# Patient Record
Sex: Female | Born: 1985
Health system: Southern US, Community
[De-identification: ages and names within clinical notes are randomized; demographics above are authoritative.]

## PROBLEM LIST (undated history)

## (undated) DIAGNOSIS — K429 Umbilical hernia without obstruction or gangrene: Secondary | ICD-10-CM

## (undated) DIAGNOSIS — J45909 Unspecified asthma, uncomplicated: Secondary | ICD-10-CM

## (undated) DIAGNOSIS — G43909 Migraine, unspecified, not intractable, without status migrainosus: Secondary | ICD-10-CM

## (undated) DIAGNOSIS — O24419 Gestational diabetes mellitus in pregnancy, unspecified control: Secondary | ICD-10-CM

## (undated) DIAGNOSIS — F419 Anxiety disorder, unspecified: Secondary | ICD-10-CM

## (undated) DIAGNOSIS — E785 Hyperlipidemia, unspecified: Secondary | ICD-10-CM

## (undated) DIAGNOSIS — D649 Anemia, unspecified: Secondary | ICD-10-CM

## (undated) HISTORY — DX: Hyperlipidemia, unspecified: E78.5

## (undated) HISTORY — DX: Migraine, unspecified, not intractable, without status migrainosus: G43.909

## (undated) HISTORY — DX: Anxiety disorder, unspecified: F41.9

## (undated) HISTORY — PX: WISDOM TOOTH EXTRACTION: SHX21

## (undated) HISTORY — DX: Anemia, unspecified: D64.9

## (undated) HISTORY — DX: Gestational diabetes mellitus in pregnancy, unspecified control: O24.419

## (undated) HISTORY — PX: TUBAL LIGATION: SHX77

## (undated) HISTORY — DX: Unspecified asthma, uncomplicated: J45.909

## (undated) HISTORY — PX: HERNIA REPAIR: SHX51

---

## 2005-05-11 ENCOUNTER — Ambulatory Visit: Payer: Self-pay | Admitting: Internal Medicine

## 2005-05-25 ENCOUNTER — Ambulatory Visit: Payer: Self-pay | Admitting: Gastroenterology

## 2009-12-30 ENCOUNTER — Ambulatory Visit: Payer: Self-pay | Admitting: Internal Medicine

## 2010-01-05 ENCOUNTER — Emergency Department: Payer: Self-pay | Admitting: Emergency Medicine

## 2010-09-13 ENCOUNTER — Encounter: Payer: Self-pay | Admitting: Maternal and Fetal Medicine

## 2010-10-11 ENCOUNTER — Encounter: Payer: Self-pay | Admitting: Maternal & Fetal Medicine

## 2010-11-08 ENCOUNTER — Encounter: Payer: Self-pay | Admitting: Obstetrics & Gynecology

## 2010-12-09 ENCOUNTER — Encounter: Payer: Self-pay | Admitting: Obstetrics and Gynecology

## 2011-01-11 ENCOUNTER — Inpatient Hospital Stay: Payer: Self-pay

## 2011-08-23 ENCOUNTER — Ambulatory Visit: Payer: Self-pay

## 2011-09-01 LAB — OB RESULTS CONSOLE HIV ANTIBODY (ROUTINE TESTING): HIV: NONREACTIVE

## 2011-09-01 LAB — OB RESULTS CONSOLE RPR: RPR: NONREACTIVE

## 2011-09-01 LAB — OB RESULTS CONSOLE ABO/RH: RH Type: POSITIVE

## 2012-02-24 ENCOUNTER — Ambulatory Visit (INDEPENDENT_AMBULATORY_CARE_PROVIDER_SITE_OTHER): Payer: 59 | Admitting: Obstetrics and Gynecology

## 2012-02-24 ENCOUNTER — Encounter: Payer: Self-pay | Admitting: Obstetrics and Gynecology

## 2012-02-24 VITALS — BP 103/70 | Ht 63.0 in | Wt 195.0 lb

## 2012-02-24 DIAGNOSIS — Z3480 Encounter for supervision of other normal pregnancy, unspecified trimester: Secondary | ICD-10-CM

## 2012-02-24 DIAGNOSIS — G43909 Migraine, unspecified, not intractable, without status migrainosus: Secondary | ICD-10-CM | POA: Insufficient documentation

## 2012-02-24 DIAGNOSIS — Z3490 Encounter for supervision of normal pregnancy, unspecified, unspecified trimester: Secondary | ICD-10-CM | POA: Insufficient documentation

## 2012-02-24 DIAGNOSIS — O99519 Diseases of the respiratory system complicating pregnancy, unspecified trimester: Secondary | ICD-10-CM

## 2012-02-24 DIAGNOSIS — J45909 Unspecified asthma, uncomplicated: Secondary | ICD-10-CM | POA: Insufficient documentation

## 2012-02-24 DIAGNOSIS — O34219 Maternal care for unspecified type scar from previous cesarean delivery: Secondary | ICD-10-CM | POA: Insufficient documentation

## 2012-02-24 DIAGNOSIS — O99891 Other specified diseases and conditions complicating pregnancy: Secondary | ICD-10-CM

## 2012-02-24 NOTE — Progress Notes (Signed)
Patient transferred care from [facility]- no records available for review at this time. Patient is without any complaints. Reports uncomplicated pregnancy thus far. Patient desires TOLAC- reviewed risks and benefits with patient. Will have patient sign consent after review of records. She reports previous c-section secondary to failure to progress

## 2012-03-08 ENCOUNTER — Ambulatory Visit (INDEPENDENT_AMBULATORY_CARE_PROVIDER_SITE_OTHER): Payer: 59 | Admitting: Obstetrics & Gynecology

## 2012-03-08 VITALS — BP 114/73 | Wt 197.0 lb

## 2012-03-08 DIAGNOSIS — Z3490 Encounter for supervision of normal pregnancy, unspecified, unspecified trimester: Secondary | ICD-10-CM

## 2012-03-08 DIAGNOSIS — Z3480 Encounter for supervision of other normal pregnancy, unspecified trimester: Secondary | ICD-10-CM

## 2012-03-08 NOTE — Progress Notes (Signed)
Reviewed records from [place], no complications. History of previous cesarean section, desires TOLAC, will review consent at home and sign at next visit. Does not want BTL, unsure of BCM. No other complaints or concerns.  Fetal movement and labor precautions reviewed.

## 2012-03-08 NOTE — Patient Instructions (Signed)
Return to clinic for any obstetric concerns or go to [facility] for evaluation  

## 2012-03-21 HISTORY — PX: BREAST CYST EXCISION: SHX579

## 2012-03-22 ENCOUNTER — Encounter: Payer: Self-pay | Admitting: Obstetrics & Gynecology

## 2012-03-22 ENCOUNTER — Ambulatory Visit (INDEPENDENT_AMBULATORY_CARE_PROVIDER_SITE_OTHER): Payer: 59 | Admitting: Obstetrics & Gynecology

## 2012-03-22 VITALS — BP 126/67 | Wt 198.0 lb

## 2012-03-22 DIAGNOSIS — Z3480 Encounter for supervision of other normal pregnancy, unspecified trimester: Secondary | ICD-10-CM

## 2012-03-22 DIAGNOSIS — Z3490 Encounter for supervision of normal pregnancy, unspecified, unspecified trimester: Secondary | ICD-10-CM

## 2012-03-22 DIAGNOSIS — Z8279 Family history of other congenital malformations, deformations and chromosomal abnormalities: Secondary | ICD-10-CM | POA: Insufficient documentation

## 2012-03-22 DIAGNOSIS — O34219 Maternal care for unspecified type scar from previous cesarean delivery: Secondary | ICD-10-CM

## 2012-03-22 NOTE — Progress Notes (Signed)
Rare contractions, no discharge, good movement

## 2012-03-22 NOTE — Patient Instructions (Addendum)
Vaginal Birth After Cesarean Delivery  Vaginal birth after Cesarean delivery (VBAC) is giving birth vaginally after previously delivering a baby by a cesarean. In the past, if a woman had a Cesarean delivery, all births afterwards would be done by Cesarean delivery. This is no longer true. It can be safe for the mother to try a vaginal delivery after having a Cesarean. The final decision to have a VBAC or repeat Cesarean delivery should be between the patient and her caregiver. The risks and benefits can be discussed relative to the reason for, and the type of the previous Cesarean delivery.  WOMEN WHO PLAN TO HAVE A VBAC SHOULD CHECK WITH THEIR DOCTOR TO BE SURE THAT:  · The previous Cesarean was done with a low transverse uterine incision (not a vertical classical incision).  · The birth canal is big enough for the baby.  · There were no other operations on the uterus.  · They will have an electronic fetal monitor (EFM) on at all times during labor.  · An operating room would be available and ready in case an emergency Cesarean is needed.  · A doctor and surgical nursing staff would be available at all times during labor to be ready to do an emergency Cesarean if necessary.  · An anesthesiologist would be present in case an emergency Cesarean is needed.  · The nursery is prepared and has adequate personnel and necessary equipment available to care for the baby in case of an emergency Cesarean.  BENEFITS OF VBAC:  · Shorter stay in the hospital.  · Lower delivery, nursery and hospital costs.  · Less blood loss and need for blood transfusions.  · Less fever and discomfort from major surgery.  · Lower risk of blood clots.  · Lower risk of infection.  · Shorter recovery after going home.  · Lower risk of other surgical complications, such as opening of the incision or hernia in the incision.  · Decreased risk of injury to other organs.  · Decreased risk for having to remove the uterus (hysterectomy).  · Decreased risk  for the placenta to completely or partially cover the opening of the uterus (placenta previa) with a future pregnancy.  · Ability to have a larger family if desired.  RISKS OF A VBAC:  · Rupture of the uterus.  · Having to remove the uterus (hysterectomy) if it ruptures.  · All the complications of major surgery and/or injury to other organs.  · Excessive bleeding, blood clots and infection.  · Lower Apgar scores (method to evaluate the newborn based on appearance, pulse, grimace, activity, and respiration) and more risks to the baby.  · There is a higher risk of uterine rupture if you induce or augment labor.  · There is a higher risk of uterine rupture if you use medications to ripen the cervix.  VBAC SHOULD NOT BE DONE IF:  · The previous Cesarean was done with a vertical (classical) or T-shaped incision, or you do not know what kind of an incision was made.  · You had a ruptured uterus.  · You had surgery on your uterus.  · You have medical or obstetrical problems.  · There are problems with the baby.  · There were two previous Cesarean deliveries and no vaginal deliveries.  OTHER FACTS TO KNOW ABOUT VBAC:  · It is safe to have an epidural anesthetic with VBAC.  · It is safe to turn the baby from a breech position (attempt an external   cephalic version).  · It is safe to try a VBAC with twins.  · Pregnancies later than 40 weeks have not been successful with VBAC.  · There is an increased failure rate of a VBAC in obese pregnant women.  · There is an increased failure rate with VABC if the baby weighs 8.8 pounds (4000 grams) or more.  · There is an increased failure rate if the time between the Cesarean and VBAC is less than 19 months.  · There is an increased failure rate if pre-eclampsia is present (high blood pressure, protein in the urine and swelling of face and extremities).  · VBAC is very successful if there was a previous vaginal birth.  · VBAC is very successful when the labor starts spontaneously before  the due date.  · Delivery of VBAC is similar to having a normal spontaneous vaginal delivery.  It is important to discuss VBAC with your caregiver early in the pregnancy so you can understand the risks, benefits and options. It will give you time to decide what is best in your particular case relevant to the reason for your previous Cesarean delivery. It should be understood that medical changes in the mother or pregnancy may occur during the pregnancy, which make it necessary to change you or your caregiver's initial decision. The counseling, concerns and decisions should be documented in the medical record and signed by all parties.  Document Released: 08/28/2006 Document Revised: 05/30/2011 Document Reviewed: 04/18/2008  [employer]® Patient Information ©2013 [employer].

## 2012-03-30 ENCOUNTER — Ambulatory Visit (INDEPENDENT_AMBULATORY_CARE_PROVIDER_SITE_OTHER): Payer: 59 | Admitting: Obstetrics & Gynecology

## 2012-03-30 ENCOUNTER — Encounter: Payer: Self-pay | Admitting: Obstetrics & Gynecology

## 2012-03-30 ENCOUNTER — Other Ambulatory Visit: Payer: Self-pay | Admitting: Obstetrics & Gynecology

## 2012-03-30 VITALS — BP 121/74 | Wt 197.0 lb

## 2012-03-30 DIAGNOSIS — Z3480 Encounter for supervision of other normal pregnancy, unspecified trimester: Secondary | ICD-10-CM

## 2012-03-30 DIAGNOSIS — Z3490 Encounter for supervision of normal pregnancy, unspecified, unspecified trimester: Secondary | ICD-10-CM

## 2012-03-30 DIAGNOSIS — Z3400 Encounter for supervision of normal first pregnancy, unspecified trimester: Secondary | ICD-10-CM

## 2012-03-30 DIAGNOSIS — O34219 Maternal care for unspecified type scar from previous cesarean delivery: Secondary | ICD-10-CM

## 2012-03-30 NOTE — Progress Notes (Signed)
Routine visit. Good FM. No OB problems. Labor precautions reviewed. She says that she signed the [facility] consent form. Cervical cultures obtained.

## 2012-04-04 ENCOUNTER — Encounter: Payer: Self-pay | Admitting: Obstetrics & Gynecology

## 2012-04-05 LAB — TEST CODE CHANGE

## 2012-04-06 ENCOUNTER — Ambulatory Visit (INDEPENDENT_AMBULATORY_CARE_PROVIDER_SITE_OTHER): Payer: 59 | Admitting: Family Medicine

## 2012-04-06 ENCOUNTER — Encounter: Payer: Self-pay | Admitting: Family Medicine

## 2012-04-06 VITALS — BP 125/80 | Wt 199.0 lb

## 2012-04-06 DIAGNOSIS — O34219 Maternal care for unspecified type scar from previous cesarean delivery: Secondary | ICD-10-CM

## 2012-04-06 DIAGNOSIS — Z3480 Encounter for supervision of other normal pregnancy, unspecified trimester: Secondary | ICD-10-CM

## 2012-04-06 NOTE — Progress Notes (Signed)
Has changed her mind and now desires ERCS to avoid day of surgery for previous child. Unless comes in in labor.

## 2012-04-06 NOTE — Patient Instructions (Addendum)
Pregnancy - Third Trimester The third trimester of pregnancy (the last 3 months) is a period of the most rapid growth for you and your baby. The baby approaches a length of 20 inches and a weight of 6 to 10 pounds. The baby is adding on fat and getting ready for life outside your body. While inside, babies have periods of sleeping and waking, suck their thumbs, and hiccups. You can often feel small contractions of the uterus. This is false labor. It is also called Braxton-Hicks contractions. This is like a practice for labor. The usual problems in this stage of pregnancy include more difficulty breathing, swelling of the hands and feet from water retention, and having to urinate more often because of the uterus and baby pressing on your bladder.  PRENATAL EXAMS  Blood work may continue to be done during prenatal exams. These tests are done to check on your health and the probable health of your baby. Blood work is used to follow your blood levels (hemoglobin). Anemia (low hemoglobin) is common during pregnancy. Iron and vitamins are given to help prevent this. You may also continue to be checked for diabetes. Some of the past blood tests may be done again.  The size of the uterus is measured during each visit. This makes sure your baby is growing properly according to your pregnancy dates.  Your blood pressure is checked every prenatal visit. This is to make sure you are not getting toxemia.  Your urine is checked every prenatal visit for infection, diabetes and protein.  Your weight is checked at each visit. This is done to make sure gains are happening at the suggested rate and that you and your baby are growing normally.  Sometimes, an ultrasound is performed to confirm the position and the proper growth and development of the baby. This is a test done that bounces harmless sound waves off the baby so your caregiver can more accurately determine due dates.  Discuss the type of pain medication  and anesthesia you will have during your labor and delivery.  Discuss the possibility and anesthesia if a Cesarean Section might be necessary.  Inform your caregiver if there is any mental or physical violence at home. Sometimes, a specialized non-stress test, contraction stress test and biophysical profile are done to make sure the baby is not having a problem. Checking the amniotic fluid surrounding the baby is called an amniocentesis. The amniotic fluid is removed by sticking a needle into the belly (abdomen). This is sometimes done near the end of pregnancy if an early delivery is required. In this case, it is done to help make sure the baby's lungs are mature enough for the baby to live outside of the womb. If the lungs are not mature and it is unsafe to deliver the baby, an injection of cortisone medication is given to the mother 1 to 2 days before the delivery. This helps the baby's lungs mature and makes it safer to deliver the baby. CHANGES OCCURING IN THE THIRD TRIMESTER OF PREGNANCY Your body goes through many changes during pregnancy. They vary from person to person. Talk to your caregiver about changes you notice and are concerned about.  During the last trimester, you have probably had an increase in your appetite. It is normal to have cravings for certain foods. This varies from person to person and pregnancy to pregnancy.  You may begin to get stretch marks on your hips, abdomen, and breasts. These are normal changes in the body   during pregnancy. There are no exercises or medications to take which prevent this change.  Constipation may be treated with a stool softener or adding bulk to your diet. Drinking lots of fluids, fiber in vegetables, fruits, and whole grains are helpful.  Exercising is also helpful. If you have been very active up until your pregnancy, most of these activities can be continued during your pregnancy. If you have been less active, it is helpful to start an  exercise program such as walking. Consult your caregiver before starting exercise programs.  Avoid all smoking, alcohol, un-prescribed drugs, herbs and "street drugs" during your pregnancy. These chemicals affect the formation and growth of the baby. Avoid chemicals throughout the pregnancy to ensure the delivery of a healthy infant.  Backache, varicose veins and hemorrhoids may develop or get worse.  You will tire more easily in the third trimester, which is normal.  The baby's movements may be stronger and more often.  You may become short of breath easily.  Your belly button may stick out.  A yellow discharge may leak from your breasts called colostrum.  You may have a bloody mucus discharge. This usually occurs a few days to a week before labor begins. HOME CARE INSTRUCTIONS   Keep your caregiver's appointments. Follow your caregiver's instructions regarding medication use, exercise, and diet.  During pregnancy, you are providing food for you and your baby. Continue to eat regular, well-balanced meals. Choose foods such as meat, fish, milk and other low fat dairy products, vegetables, fruits, and whole-grain breads and cereals. Your caregiver will tell you of the ideal weight gain.  A physical sexual relationship may be continued throughout pregnancy if there are no other problems such as early (premature) leaking of amniotic fluid from the membranes, vaginal bleeding, or belly (abdominal) pain.  Exercise regularly if there are no restrictions. Check with your caregiver if you are unsure of the safety of your exercises. Greater weight gain will occur in the last 2 trimesters of pregnancy. Exercising helps:  Control your weight.  Get you in shape for labor and delivery.  You lose weight after you deliver.  Rest a lot with legs elevated, or as needed for leg cramps or low back pain.  Wear a good support or jogging bra for breast tenderness during pregnancy. This may help if worn  during sleep. Pads or tissues may be used in the bra if you are leaking colostrum.  Do not use hot tubs, steam rooms, or saunas.  Wear your seat belt when driving. This protects you and your baby if you are in an accident.  Avoid raw meat, cat litter boxes and soil used by cats. These carry germs that can cause birth defects in the baby.  It is easier to loose urine during pregnancy. Tightening up and strengthening the pelvic muscles will help with this problem. You can practice stopping your urination while you are going to the bathroom. These are the same muscles you need to strengthen. It is also the muscles you would use if you were trying to stop from passing gas. You can practice tightening these muscles up 10 times a set and repeating this about 3 times per day. Once you know what muscles to tighten up, do not perform these exercises during urination. It is more likely to cause an infection by backing up the urine.  Ask for help if you have financial, counseling or nutritional needs during pregnancy. Your caregiver will be able to offer counseling for these   needs as well as refer you for other special needs.  Make a list of emergency phone numbers and have them available.  Plan on getting help from family or friends when you go home from the hospital.  Make a trial run to the hospital.  Take prenatal classes with the father to understand, practice and ask questions about the labor and delivery.  Prepare the baby's room/nursery.  Do not travel out of the city unless it is absolutely necessary and with the advice of your caregiver.  Wear only low or no heal shoes to have better balance and prevent falling. MEDICATIONS AND DRUG USE IN PREGNANCY  Take prenatal vitamins as directed. The vitamin should contain 1 milligram of folic acid. Keep all vitamins out of reach of children. Only a couple vitamins or tablets containing iron may be fatal to a baby or young child when  ingested.  Avoid use of all medications, including herbs, over-the-counter medications, not prescribed or suggested by your caregiver. Only take over-the-counter or prescription medicines for pain, discomfort, or fever as directed by your caregiver. Do not use aspirin, ibuprofen (Motrin, Advil, Nuprin) or naproxen (Aleve) unless OK'd by your caregiver.  Let your caregiver also know about herbs you may be using.  Alcohol is related to a number of birth defects. This includes fetal alcohol syndrome. All alcohol, in any form, should be avoided completely. Smoking will cause low birth rate and premature babies.  Street/illegal drugs are very harmful to the baby. They are absolutely forbidden. A baby born to an addicted mother will be addicted at birth. The baby will go through the same withdrawal an adult does. SEEK MEDICAL CARE IF: You have any concerns or worries during your pregnancy. It is better to call with your questions if you feel they cannot wait, rather than worry about them. DECISIONS ABOUT CIRCUMCISION You may or may not know the sex of your baby. If you know your baby is a boy, it may be time to think about circumcision. Circumcision is the removal of the foreskin of the penis. This is the skin that covers the sensitive end of the penis. There is no proven medical need for this. Often this decision is made on what is popular at the time or based upon religious beliefs and social issues. You can discuss these issues with your caregiver or pediatrician. SEEK IMMEDIATE MEDICAL CARE IF:   An unexplained oral temperature above 102 F (38.9 C) develops, or as your caregiver suggests.  You have leaking of fluid from the vagina (birth canal). If leaking membranes are suspected, take your temperature and tell your caregiver of this when you call.  There is vaginal spotting, bleeding or passing clots. Tell your caregiver of the amount and how many pads are used.  You develop a bad smelling  vaginal discharge with a change in the color from clear to white.  You develop vomiting that lasts more than 24 hours.  You develop chills or fever.  You develop shortness of breath.  You develop burning on urination.  You loose more than 2 pounds of weight or gain more than 2 pounds of weight or as suggested by your caregiver.  You notice sudden swelling of your face, hands, and feet or legs.  You develop belly (abdominal) pain. Round ligament discomfort is a common non-cancerous (benign) cause of abdominal pain in pregnancy. Your caregiver still must evaluate you.  You develop a severe headache that does not go away.  You develop visual   problems, blurred or double vision.  If you have not felt your baby move for more than 1 hour. If you think the baby is not moving as much as usual, eat something with sugar in it and lie down on your left side for an hour. The baby should move at least 4 to 5 times per hour. Call right away if your baby moves less than that.  You fall, are in a car accident or any kind of trauma.  There is mental or physical violence at home. Document Released: 03/01/2001 Document Revised: 05/30/2011 Document Reviewed: 09/03/2008 [employer] Patient Information 2013 [employer].  Breastfeeding Deciding to breastfeed is one of the best choices you can make for you and your baby. The information that follows gives a brief overview of the benefits of breastfeeding as well as common topics surrounding breastfeeding. BENEFITS OF BREASTFEEDING For the baby  The first milk (colostrum) helps the baby's digestive system function better.   There are antibodies in the mother's milk that help the baby fight off infections.   The baby has a lower incidence of asthma, allergies, and sudden infant death syndrome (SIDS).   The nutrients in breast milk are better for the baby than infant formulas, and breast milk helps the baby's brain grow better.   Babies who  breastfeed have less gas, colic, and constipation.  For the mother  Breastfeeding helps develop a very special bond between the mother and her baby.   Breastfeeding is convenient, always available at the correct temperature, and costs nothing.   Breastfeeding burns calories in the mother and helps her lose weight that was gained during pregnancy.   Breastfeeding makes the uterus contract back down to normal size faster and slows bleeding following delivery.   Breastfeeding mothers have a lower risk of developing breast cancer.  BREASTFEEDING FREQUENCY  A healthy, full-term baby may breastfeed as often as every hour or space his or her feedings to every 3 hours.   Watch your baby for signs of hunger. Nurse your baby if he or she shows signs of hunger. How often you nurse will vary from baby to baby.   Nurse as often as the baby requests, or when you feel the need to reduce the fullness of your breasts.   Awaken the baby if it has been 3 4 hours since the last feeding.   Frequent feeding will help the mother make more milk and will help prevent problems, such as sore nipples and engorgement of the breasts.  BABY'S POSITION AT THE BREAST  Whether lying down or sitting, be sure that the baby's tummy is facing your tummy.   Support the breast with 4 fingers underneath the breast and the thumb above. Make sure your fingers are well away from the nipple and baby's mouth.   Stroke the baby's lips gently with your finger or nipple.   When the baby's mouth is open wide enough, place all of your nipple and as much of the areola as possible into your baby's mouth.   Pull the baby in close so the tip of the nose and the baby's cheeks touch the breast during the feeding.  FEEDINGS AND SUCTION  The length of each feeding varies from baby to baby and from feeding to feeding.   The baby must suck about 2 3 minutes for your milk to get to him or her. This is called a "let down."  For this reason, allow the baby to feed on each breast as   long as he or she wants. Your baby will end the feeding when he or she has received the right balance of nutrients.   To break the suction, put your finger into the corner of the baby's mouth and slide it between his or her gums before removing your breast from his or her mouth. This will help prevent sore nipples.  HOW TO TELL WHETHER YOUR BABY IS GETTING ENOUGH BREAST MILK. Wondering whether or not your baby is getting enough milk is a common concern among mothers. You can be assured that your baby is getting enough milk if:   Your baby is actively sucking and you hear swallowing.   Your baby seems relaxed and satisfied after a feeding.   Your baby nurses at least 8 12 times in a 24 hour time period. Nurse your baby until he or she unlatches or falls asleep at the first breast (at least 10 20 minutes), then offer the second side.   Your baby is wetting 5 6 disposable diapers (6 8 cloth diapers) in a 24 hour period by 5 6 days of age.   Your baby is having at least 3 4 stools every 24 hours for the first 6 weeks. The stool should be soft and yellow.   Your baby should gain 4 7 ounces per week after he or she is 4 days old.   Your breasts feel softer after nursing.  REDUCING BREAST ENGORGEMENT  In the first week after your baby is born, you may experience signs of breast engorgement. When breasts are engorged, they feel heavy, warm, full, and may be tender to the touch. You can reduce engorgement if you:   Nurse frequently, every 2 3 hours. Mothers who breastfeed early and often have fewer problems with engorgement.   Place light ice packs on your breasts for 10 20 minutes between feedings. This reduces swelling. Wrap the ice packs in a lightweight towel to protect your skin. Bags of frozen vegetables work well for this purpose.   Take a warm shower or apply warm, moist heat to your breast for 5 10 minutes just before  each feeding. This increases circulation and helps the milk flow.   Gently massage your breast before and during the feeding. Using your finger tips, massage from the chest wall towards your nipple in a circular motion.   Make sure that the baby empties at least one breast at every feeding before switching sides.   Use a breast pump to empty the breasts if your baby is sleepy or not nursing well. You may also want to pump if you are returning to work oryou feel you are getting engorged.   Avoid bottle feeds, pacifiers, or supplemental feedings of water or juice in place of breastfeeding. Breast milk is all the food your baby needs. It is not necessary for your baby to have water or formula. In fact, to help your breasts make more milk, it is best not to give your baby supplemental feedings during the early weeks.   Be sure the baby is latched on and positioned properly while breastfeeding.   Wear a supportive bra, avoiding underwire styles.   Eat a balanced diet with enough fluids.   Rest often, relax, and take your prenatal vitamins to prevent fatigue, stress, and anemia.  If you follow these suggestions, your engorgement should improve in 24 48 hours. If you are still experiencing difficulty, call your lactation consultant or caregiver.  CARING FOR YOURSELF Take care of your   breasts  Bathe or shower daily.   Avoid using soap on your nipples.   Start feedings on your left breast at one feeding and on your right breast at the next feeding.   You will notice an increase in your milk supply 2 5 days after delivery. You may feel some discomfort from engorgement, which makes your breasts very firm and often tender. Engorgement "peaks" out within 24 48 hours. In the meantime, apply warm moist towels to your breasts for 5 10 minutes before feeding. Gentle massage and expression of some milk before feeding will soften your breasts, making it easier for your baby to latch on.    Wear a well-fitting nursing bra, and air dry your nipples for a 3 4minutes after each feeding.   Only use cotton bra pads.   Only use pure lanolin on your nipples after nursing. You do not need to wash it off before feeding the baby again. Another option is to express a few drops of breast milk and gently massage it into your nipples.  Take care of yourself  Eat well-balanced meals and nutritious snacks.   Drinking milk, fruit juice, and water to satisfy your thirst (about 8 glasses a day).   Get plenty of rest.  Avoid foods that you notice affect the baby in a bad way.  SEEK MEDICAL CARE IF:   You have difficulty with breastfeeding and need help.   You have a hard, red, sore area on your breast that is accompanied by a fever.   Your baby is too sleepy to eat well or is having trouble sleeping.   Your baby is wetting less than 6 diapers a day, by 5 days of age.   Your baby's skin or white part of his or her eyes is more yellow than it was in the hospital.   You feel depressed.  Document Released: 03/07/2005 Document Revised: 09/06/2011 Document Reviewed: 06/05/2011 [employer] Patient Information 2013 [employer].  

## 2012-04-09 LAB — GC/CHLAMYDIA PROBE AMP: Chlamydia trachomatis, NAA: NEGATIVE

## 2012-04-10 ENCOUNTER — Encounter: Payer: Self-pay | Admitting: Family Medicine

## 2012-04-10 ENCOUNTER — Ambulatory Visit (INDEPENDENT_AMBULATORY_CARE_PROVIDER_SITE_OTHER): Payer: 59 | Admitting: Family Medicine

## 2012-04-10 VITALS — BP 117/76 | Wt 200.0 lb

## 2012-04-10 DIAGNOSIS — Z3480 Encounter for supervision of other normal pregnancy, unspecified trimester: Secondary | ICD-10-CM

## 2012-04-10 DIAGNOSIS — O34219 Maternal care for unspecified type scar from previous cesarean delivery: Secondary | ICD-10-CM

## 2012-04-10 NOTE — Progress Notes (Signed)
No new issues.  Given date for C-section Membranes stripped.

## 2012-04-10 NOTE — Assessment & Plan Note (Signed)
Scheduled for [facility] with Dr. Penne Lash on 04/16/2012 @ 9 am

## 2012-04-10 NOTE — Patient Instructions (Signed)
Pregnancy - Third Trimester The third trimester of pregnancy (the last 3 months) is a period of the most rapid growth for you and your baby. The baby approaches a length of 20 inches and a weight of 6 to 10 pounds. The baby is adding on fat and getting ready for life outside your body. While inside, babies have periods of sleeping and waking, suck their thumbs, and hiccups. You can often feel small contractions of the uterus. This is false labor. It is also called Braxton-Hicks contractions. This is like a practice for labor. The usual problems in this stage of pregnancy include more difficulty breathing, swelling of the hands and feet from water retention, and having to urinate more often because of the uterus and baby pressing on your bladder.  PRENATAL EXAMS  Blood work may continue to be done during prenatal exams. These tests are done to check on your health and the probable health of your baby. Blood work is used to follow your blood levels (hemoglobin). Anemia (low hemoglobin) is common during pregnancy. Iron and vitamins are given to help prevent this. You may also continue to be checked for diabetes. Some of the past blood tests may be done again.  The size of the uterus is measured during each visit. This makes sure your baby is growing properly according to your pregnancy dates.  Your blood pressure is checked every prenatal visit. This is to make sure you are not getting toxemia.  Your urine is checked every prenatal visit for infection, diabetes and protein.  Your weight is checked at each visit. This is done to make sure gains are happening at the suggested rate and that you and your baby are growing normally.  Sometimes, an ultrasound is performed to confirm the position and the proper growth and development of the baby. This is a test done that bounces harmless sound waves off the baby so your caregiver can more accurately determine due dates.  Discuss the type of pain medication  and anesthesia you will have during your labor and delivery.  Discuss the possibility and anesthesia if a Cesarean Section might be necessary.  Inform your caregiver if there is any mental or physical violence at home. Sometimes, a specialized non-stress test, contraction stress test and biophysical profile are done to make sure the baby is not having a problem. Checking the amniotic fluid surrounding the baby is called an amniocentesis. The amniotic fluid is removed by sticking a needle into the belly (abdomen). This is sometimes done near the end of pregnancy if an early delivery is required. In this case, it is done to help make sure the baby's lungs are mature enough for the baby to live outside of the womb. If the lungs are not mature and it is unsafe to deliver the baby, an injection of cortisone medication is given to the mother 1 to 2 days before the delivery. This helps the baby's lungs mature and makes it safer to deliver the baby. CHANGES OCCURING IN THE THIRD TRIMESTER OF PREGNANCY Your body goes through many changes during pregnancy. They vary from person to person. Talk to your caregiver about changes you notice and are concerned about.  During the last trimester, you have probably had an increase in your appetite. It is normal to have cravings for certain foods. This varies from person to person and pregnancy to pregnancy.  You may begin to get stretch marks on your hips, abdomen, and breasts. These are normal changes in the body   during pregnancy. There are no exercises or medications to take which prevent this change.  Constipation may be treated with a stool softener or adding bulk to your diet. Drinking lots of fluids, fiber in vegetables, fruits, and whole grains are helpful.  Exercising is also helpful. If you have been very active up until your pregnancy, most of these activities can be continued during your pregnancy. If you have been less active, it is helpful to start an  exercise program such as walking. Consult your caregiver before starting exercise programs.  Avoid all smoking, alcohol, un-prescribed drugs, herbs and "street drugs" during your pregnancy. These chemicals affect the formation and growth of the baby. Avoid chemicals throughout the pregnancy to ensure the delivery of a healthy infant.  Backache, varicose veins and hemorrhoids may develop or get worse.  You will tire more easily in the third trimester, which is normal.  The baby's movements may be stronger and more often.  You may become short of breath easily.  Your belly button may stick out.  A yellow discharge may leak from your breasts called colostrum.  You may have a bloody mucus discharge. This usually occurs a few days to a week before labor begins. HOME CARE INSTRUCTIONS   Keep your caregiver's appointments. Follow your caregiver's instructions regarding medication use, exercise, and diet.  During pregnancy, you are providing food for you and your baby. Continue to eat regular, well-balanced meals. Choose foods such as meat, fish, milk and other low fat dairy products, vegetables, fruits, and whole-grain breads and cereals. Your caregiver will tell you of the ideal weight gain.  A physical sexual relationship may be continued throughout pregnancy if there are no other problems such as early (premature) leaking of amniotic fluid from the membranes, vaginal bleeding, or belly (abdominal) pain.  Exercise regularly if there are no restrictions. Check with your caregiver if you are unsure of the safety of your exercises. Greater weight gain will occur in the last 2 trimesters of pregnancy. Exercising helps:  Control your weight.  Get you in shape for labor and delivery.  You lose weight after you deliver.  Rest a lot with legs elevated, or as needed for leg cramps or low back pain.  Wear a good support or jogging bra for breast tenderness during pregnancy. This may help if worn  during sleep. Pads or tissues may be used in the bra if you are leaking colostrum.  Do not use hot tubs, steam rooms, or saunas.  Wear your seat belt when driving. This protects you and your baby if you are in an accident.  Avoid raw meat, cat litter boxes and soil used by cats. These carry germs that can cause birth defects in the baby.  It is easier to loose urine during pregnancy. Tightening up and strengthening the pelvic muscles will help with this problem. You can practice stopping your urination while you are going to the bathroom. These are the same muscles you need to strengthen. It is also the muscles you would use if you were trying to stop from passing gas. You can practice tightening these muscles up 10 times a set and repeating this about 3 times per day. Once you know what muscles to tighten up, do not perform these exercises during urination. It is more likely to cause an infection by backing up the urine.  Ask for help if you have financial, counseling or nutritional needs during pregnancy. Your caregiver will be able to offer counseling for these   needs as well as refer you for other special needs.  Make a list of emergency phone numbers and have them available.  Plan on getting help from family or friends when you go home from the hospital.  Make a trial run to the hospital.  Take prenatal classes with the father to understand, practice and ask questions about the labor and delivery.  Prepare the baby's room/nursery.  Do not travel out of the city unless it is absolutely necessary and with the advice of your caregiver.  Wear only low or no heal shoes to have better balance and prevent falling. MEDICATIONS AND DRUG USE IN PREGNANCY  Take prenatal vitamins as directed. The vitamin should contain 1 milligram of folic acid. Keep all vitamins out of reach of children. Only a couple vitamins or tablets containing iron may be fatal to a baby or young child when  ingested.  Avoid use of all medications, including herbs, over-the-counter medications, not prescribed or suggested by your caregiver. Only take over-the-counter or prescription medicines for pain, discomfort, or fever as directed by your caregiver. Do not use aspirin, ibuprofen (Motrin, Advil, Nuprin) or naproxen (Aleve) unless OK'd by your caregiver.  Let your caregiver also know about herbs you may be using.  Alcohol is related to a number of birth defects. This includes fetal alcohol syndrome. All alcohol, in any form, should be avoided completely. Smoking will cause low birth rate and premature babies.  Street/illegal drugs are very harmful to the baby. They are absolutely forbidden. A baby born to an addicted mother will be addicted at birth. The baby will go through the same withdrawal an adult does. SEEK MEDICAL CARE IF: You have any concerns or worries during your pregnancy. It is better to call with your questions if you feel they cannot wait, rather than worry about them. DECISIONS ABOUT CIRCUMCISION You may or may not know the sex of your baby. If you know your baby is a boy, it may be time to think about circumcision. Circumcision is the removal of the foreskin of the penis. This is the skin that covers the sensitive end of the penis. There is no proven medical need for this. Often this decision is made on what is popular at the time or based upon religious beliefs and social issues. You can discuss these issues with your caregiver or pediatrician. SEEK IMMEDIATE MEDICAL CARE IF:   An unexplained oral temperature above 102 F (38.9 C) develops, or as your caregiver suggests.  You have leaking of fluid from the vagina (birth canal). If leaking membranes are suspected, take your temperature and tell your caregiver of this when you call.  There is vaginal spotting, bleeding or passing clots. Tell your caregiver of the amount and how many pads are used.  You develop a bad smelling  vaginal discharge with a change in the color from clear to white.  You develop vomiting that lasts more than 24 hours.  You develop chills or fever.  You develop shortness of breath.  You develop burning on urination.  You loose more than 2 pounds of weight or gain more than 2 pounds of weight or as suggested by your caregiver.  You notice sudden swelling of your face, hands, and feet or legs.  You develop belly (abdominal) pain. Round ligament discomfort is a common non-cancerous (benign) cause of abdominal pain in pregnancy. Your caregiver still must evaluate you.  You develop a severe headache that does not go away.  You develop visual   problems, blurred or double vision.  If you have not felt your baby move for more than 1 hour. If you think the baby is not moving as much as usual, eat something with sugar in it and lie down on your left side for an hour. The baby should move at least 4 to 5 times per hour. Call right away if your baby moves less than that.  You fall, are in a car accident or any kind of trauma.  There is mental or physical violence at home. Document Released: 03/01/2001 Document Revised: 05/30/2011 Document Reviewed: 09/03/2008 [employer] Patient Information 2013 [employer].  Breastfeeding Deciding to breastfeed is one of the best choices you can make for you and your baby. The information that follows gives a brief overview of the benefits of breastfeeding as well as common topics surrounding breastfeeding. BENEFITS OF BREASTFEEDING For the baby  The first milk (colostrum) helps the baby's digestive system function better.   There are antibodies in the mother's milk that help the baby fight off infections.   The baby has a lower incidence of asthma, allergies, and sudden infant death syndrome (SIDS).   The nutrients in breast milk are better for the baby than infant formulas, and breast milk helps the baby's brain grow better.   Babies who  breastfeed have less gas, colic, and constipation.  For the mother  Breastfeeding helps develop a very special bond between the mother and her baby.   Breastfeeding is convenient, always available at the correct temperature, and costs nothing.   Breastfeeding burns calories in the mother and helps her lose weight that was gained during pregnancy.   Breastfeeding makes the uterus contract back down to normal size faster and slows bleeding following delivery.   Breastfeeding mothers have a lower risk of developing breast cancer.  BREASTFEEDING FREQUENCY  A healthy, full-term baby may breastfeed as often as every hour or space his or her feedings to every 3 hours.   Watch your baby for signs of hunger. Nurse your baby if he or she shows signs of hunger. How often you nurse will vary from baby to baby.   Nurse as often as the baby requests, or when you feel the need to reduce the fullness of your breasts.   Awaken the baby if it has been 3 4 hours since the last feeding.   Frequent feeding will help the mother make more milk and will help prevent problems, such as sore nipples and engorgement of the breasts.  BABY'S POSITION AT THE BREAST  Whether lying down or sitting, be sure that the baby's tummy is facing your tummy.   Support the breast with 4 fingers underneath the breast and the thumb above. Make sure your fingers are well away from the nipple and baby's mouth.   Stroke the baby's lips gently with your finger or nipple.   When the baby's mouth is open wide enough, place all of your nipple and as much of the areola as possible into your baby's mouth.   Pull the baby in close so the tip of the nose and the baby's cheeks touch the breast during the feeding.  FEEDINGS AND SUCTION  The length of each feeding varies from baby to baby and from feeding to feeding.   The baby must suck about 2 3 minutes for your milk to get to him or her. This is called a "let down."  For this reason, allow the baby to feed on each breast as   long as he or she wants. Your baby will end the feeding when he or she has received the right balance of nutrients.   To break the suction, put your finger into the corner of the baby's mouth and slide it between his or her gums before removing your breast from his or her mouth. This will help prevent sore nipples.  HOW TO TELL WHETHER YOUR BABY IS GETTING ENOUGH BREAST MILK. Wondering whether or not your baby is getting enough milk is a common concern among mothers. You can be assured that your baby is getting enough milk if:   Your baby is actively sucking and you hear swallowing.   Your baby seems relaxed and satisfied after a feeding.   Your baby nurses at least 8 12 times in a 24 hour time period. Nurse your baby until he or she unlatches or falls asleep at the first breast (at least 10 20 minutes), then offer the second side.   Your baby is wetting 5 6 disposable diapers (6 8 cloth diapers) in a 24 hour period by 5 6 days of age.   Your baby is having at least 3 4 stools every 24 hours for the first 6 weeks. The stool should be soft and yellow.   Your baby should gain 4 7 ounces per week after he or she is 4 days old.   Your breasts feel softer after nursing.  REDUCING BREAST ENGORGEMENT  In the first week after your baby is born, you may experience signs of breast engorgement. When breasts are engorged, they feel heavy, warm, full, and may be tender to the touch. You can reduce engorgement if you:   Nurse frequently, every 2 3 hours. Mothers who breastfeed early and often have fewer problems with engorgement.   Place light ice packs on your breasts for 10 20 minutes between feedings. This reduces swelling. Wrap the ice packs in a lightweight towel to protect your skin. Bags of frozen vegetables work well for this purpose.   Take a warm shower or apply warm, moist heat to your breast for 5 10 minutes just before  each feeding. This increases circulation and helps the milk flow.   Gently massage your breast before and during the feeding. Using your finger tips, massage from the chest wall towards your nipple in a circular motion.   Make sure that the baby empties at least one breast at every feeding before switching sides.   Use a breast pump to empty the breasts if your baby is sleepy or not nursing well. You may also want to pump if you are returning to work oryou feel you are getting engorged.   Avoid bottle feeds, pacifiers, or supplemental feedings of water or juice in place of breastfeeding. Breast milk is all the food your baby needs. It is not necessary for your baby to have water or formula. In fact, to help your breasts make more milk, it is best not to give your baby supplemental feedings during the early weeks.   Be sure the baby is latched on and positioned properly while breastfeeding.   Wear a supportive bra, avoiding underwire styles.   Eat a balanced diet with enough fluids.   Rest often, relax, and take your prenatal vitamins to prevent fatigue, stress, and anemia.  If you follow these suggestions, your engorgement should improve in 24 48 hours. If you are still experiencing difficulty, call your lactation consultant or caregiver.  CARING FOR YOURSELF Take care of your   breasts  Bathe or shower daily.   Avoid using soap on your nipples.   Start feedings on your left breast at one feeding and on your right breast at the next feeding.   You will notice an increase in your milk supply 2 5 days after delivery. You may feel some discomfort from engorgement, which makes your breasts very firm and often tender. Engorgement "peaks" out within 24 48 hours. In the meantime, apply warm moist towels to your breasts for 5 10 minutes before feeding. Gentle massage and expression of some milk before feeding will soften your breasts, making it easier for your baby to latch on.    Wear a well-fitting nursing bra, and air dry your nipples for a 3 4minutes after each feeding.   Only use cotton bra pads.   Only use pure lanolin on your nipples after nursing. You do not need to wash it off before feeding the baby again. Another option is to express a few drops of breast milk and gently massage it into your nipples.  Take care of yourself  Eat well-balanced meals and nutritious snacks.   Drinking milk, fruit juice, and water to satisfy your thirst (about 8 glasses a day).   Get plenty of rest.  Avoid foods that you notice affect the baby in a bad way.  SEEK MEDICAL CARE IF:   You have difficulty with breastfeeding and need help.   You have a hard, red, sore area on your breast that is accompanied by a fever.   Your baby is too sleepy to eat well or is having trouble sleeping.   Your baby is wetting less than 6 diapers a day, by 5 days of age.   Your baby's skin or white part of his or her eyes is more yellow than it was in the hospital.   You feel depressed.  Document Released: 03/07/2005 Document Revised: 09/06/2011 Document Reviewed: 06/05/2011 [employer] Patient Information 2013 [employer].  

## 2012-04-11 ENCOUNTER — Encounter (HOSPITAL_COMMUNITY): Payer: Self-pay | Admitting: Pharmacist

## 2012-04-13 ENCOUNTER — Encounter (HOSPITAL_COMMUNITY): Payer: Self-pay

## 2012-04-13 ENCOUNTER — Encounter (HOSPITAL_COMMUNITY)
Admission: RE | Admit: 2012-04-13 | Discharge: 2012-04-13 | Disposition: A | Discharge status: Home / Self Care | Payer: 59 | Source: Ambulatory Visit | Attending: Obstetrics & Gynecology | Admitting: Obstetrics & Gynecology

## 2012-04-13 LAB — CBC
MCH: 29.4 pg (ref 26.0–34.0)
MCHC: 33.5 g/dL (ref 30.0–36.0)
Platelets: 234 10*3/uL (ref 150–400)
RDW: 13.7 % (ref 11.5–15.5)

## 2012-04-13 LAB — RPR: RPR Ser Ql: NONREACTIVE

## 2012-04-13 LAB — SURGICAL PCR SCREEN: Staphylococcus aureus: NEGATIVE

## 2012-04-13 NOTE — Patient Instructions (Addendum)
18 Cindy Walker  04/13/2012   Your procedure is scheduled on:  04/16/12  Enter through the Main Entrance of [facility] at 730 AM.  Pick up the phone at the desk and dial [phone].   Call this number if you have problems the morning of surgery: [phone]   Remember:   Do not eat food:After Midnight.  Do not drink clear liquids: After Midnight.  Take these medicines the morning of surgery with A SIP OF WATER: Bring inhaler   Do not wear jewelry, make-up or nail polish.  Do not wear lotions, powders, or perfumes. You may wear deodorant.  Do not shave 48 hours prior to surgery.  Do not bring valuables to the hospital.  Contacts, dentures or bridgework may not be worn into surgery.  Leave suitcase in the car. After surgery it may be brought to your room.  For patients admitted to the hospital, checkout time is 11:00 AM the day of discharge.   Patients discharged the day of surgery will not be allowed to drive home.  Name and phone number of your driver: NA  Special Instructions: Shower using CHG 2 nights before surgery and the night before surgery.  If you shower the day of surgery use CHG.  Use special wash - you have one bottle of CHG for all showers.  You should use approximately 1/3 of the bottle for each shower.   Please read over the following fact sheets that you were given: MRSA Information

## 2012-04-16 ENCOUNTER — Encounter (HOSPITAL_COMMUNITY): Payer: Self-pay | Admitting: Anesthesiology

## 2012-04-16 ENCOUNTER — Encounter (HOSPITAL_COMMUNITY): Payer: Self-pay | Admitting: *Deleted

## 2012-04-16 ENCOUNTER — Inpatient Hospital Stay (HOSPITAL_COMMUNITY)
Admission: AD | Admit: 2012-04-16 | Discharge: 2012-04-19 | DRG: 765 | Disposition: A | Discharge status: Home / Self Care | Payer: 59 | Source: Ambulatory Visit | Attending: Obstetrics & Gynecology | Admitting: Obstetrics & Gynecology

## 2012-04-16 ENCOUNTER — Inpatient Hospital Stay (HOSPITAL_COMMUNITY): Payer: 59 | Admitting: Anesthesiology

## 2012-04-16 ENCOUNTER — Encounter (HOSPITAL_COMMUNITY)
Admission: AD | Disposition: A | Discharge status: Home / Self Care | Payer: Self-pay | Source: Ambulatory Visit | Attending: Obstetrics & Gynecology

## 2012-04-16 DIAGNOSIS — O4590 Premature separation of placenta, unspecified, unspecified trimester: Secondary | ICD-10-CM | POA: Diagnosis present

## 2012-04-16 DIAGNOSIS — O99519 Diseases of the respiratory system complicating pregnancy, unspecified trimester: Secondary | ICD-10-CM

## 2012-04-16 DIAGNOSIS — Z3490 Encounter for supervision of normal pregnancy, unspecified, unspecified trimester: Secondary | ICD-10-CM

## 2012-04-16 DIAGNOSIS — O34219 Maternal care for unspecified type scar from previous cesarean delivery: Secondary | ICD-10-CM

## 2012-04-16 DIAGNOSIS — J45909 Unspecified asthma, uncomplicated: Secondary | ICD-10-CM

## 2012-04-16 LAB — PREPARE RBC (CROSSMATCH)

## 2012-04-16 SURGERY — Surgical Case
Anesthesia: Spinal | Site: Abdomen

## 2012-04-16 MED ORDER — DIPHENHYDRAMINE HCL 25 MG PO CAPS: 25.0000 mg | ORAL_CAPSULE | Freq: Four times a day (QID) | ORAL | Status: DC | PRN

## 2012-04-16 MED ORDER — EPHEDRINE 5 MG/ML INJ
INTRAVENOUS | Status: AC
  Filled 2012-04-16: qty 10

## 2012-04-16 MED ORDER — ONDANSETRON HCL 4 MG/2ML IJ SOLN
INTRAMUSCULAR | Status: AC
  Filled 2012-04-16: qty 2

## 2012-04-16 MED ORDER — HYDROMORPHONE HCL PF 1 MG/ML IJ SOLN: 0.2500 mg | INTRAMUSCULAR | Status: DC | PRN

## 2012-04-16 MED ORDER — TETANUS-DIPHTH-ACELL PERTUSSIS 5-2.5-18.5 LF-MCG/0.5 IM SUSP: 0.5000 mL | Freq: Once | INTRAMUSCULAR | Status: DC

## 2012-04-16 MED ORDER — MEPERIDINE HCL 25 MG/ML IJ SOLN: 6.2500 mg | INTRAMUSCULAR | Status: DC | PRN

## 2012-04-16 MED ORDER — PRENATAL MULTIVITAMIN CH
1.0000 | ORAL_TABLET | Freq: Every day | ORAL | Status: DC
  Administered 2012-04-16 – 2012-04-19 (×4): 1 via ORAL
  Filled 2012-04-16 (×4): qty 1

## 2012-04-16 MED ORDER — NALOXONE HCL 0.4 MG/ML IJ SOLN: 0.4000 mg | INTRAMUSCULAR | Status: DC | PRN

## 2012-04-16 MED ORDER — BUPIVACAINE IN DEXTROSE 0.75-8.25 % IT SOLN
INTRATHECAL | Status: DC | PRN
  Administered 2012-04-16: 1.6 mL via INTRATHECAL

## 2012-04-16 MED ORDER — LACTATED RINGERS IV SOLN
INTRAVENOUS | Status: DC | PRN
  Administered 2012-04-16 (×2): via INTRAVENOUS

## 2012-04-16 MED ORDER — MORPHINE SULFATE 0.5 MG/ML IJ SOLN
INTRAMUSCULAR | Status: AC
  Filled 2012-04-16: qty 10

## 2012-04-16 MED ORDER — PHENYLEPHRINE HCL 10 MG/ML IJ SOLN
INTRAMUSCULAR | Status: DC | PRN
  Administered 2012-04-16 (×3): 40 ug via INTRAVENOUS
  Administered 2012-04-16: 80 ug via INTRAVENOUS

## 2012-04-16 MED ORDER — LACTATED RINGERS IV SOLN
INTRAVENOUS | Status: DC
  Administered 2012-04-16: 09:00:00 via INTRAVENOUS
  Administered 2012-04-16: 125 mL/h via INTRAVENOUS
  Administered 2012-04-16 (×3): via INTRAVENOUS

## 2012-04-16 MED ORDER — WITCH HAZEL-GLYCERIN EX PADS: 1.0000 "application " | MEDICATED_PAD | CUTANEOUS | Status: DC | PRN

## 2012-04-16 MED ORDER — KETOROLAC TROMETHAMINE 30 MG/ML IJ SOLN: 30.0000 mg | Freq: Four times a day (QID) | INTRAMUSCULAR | Status: AC | PRN

## 2012-04-16 MED ORDER — ONDANSETRON HCL 4 MG PO TABS: 4.0000 mg | ORAL_TABLET | ORAL | Status: DC | PRN

## 2012-04-16 MED ORDER — ONDANSETRON HCL 4 MG/2ML IJ SOLN
INTRAMUSCULAR | Status: DC | PRN
  Administered 2012-04-16: 4 mg via INTRAVENOUS

## 2012-04-16 MED ORDER — MORPHINE SULFATE (PF) 0.5 MG/ML IJ SOLN
INTRAMUSCULAR | Status: DC | PRN
  Administered 2012-04-16: .2 mg via EPIDURAL

## 2012-04-16 MED ORDER — ONDANSETRON HCL 4 MG/2ML IJ SOLN: 4.0000 mg | Freq: Three times a day (TID) | INTRAMUSCULAR | Status: DC | PRN

## 2012-04-16 MED ORDER — METOCLOPRAMIDE HCL 5 MG/ML IJ SOLN: 10.0000 mg | Freq: Three times a day (TID) | INTRAMUSCULAR | Status: DC | PRN

## 2012-04-16 MED ORDER — LANOLIN HYDROUS EX OINT: 1.0000 "application " | TOPICAL_OINTMENT | CUTANEOUS | Status: DC | PRN

## 2012-04-16 MED ORDER — PNEUMOCOCCAL VAC POLYVALENT 25 MCG/0.5ML IJ INJ
0.5000 mL | INJECTION | INTRAMUSCULAR | Status: AC
  Administered 2012-04-18: 0.5 mL via INTRAMUSCULAR
  Filled 2012-04-16 (×2): qty 0.5

## 2012-04-16 MED ORDER — CEFAZOLIN SODIUM-DEXTROSE 2-3 GM-% IV SOLR
INTRAVENOUS | Status: AC
  Administered 2012-04-16: 2 g via INTRAVENOUS
  Filled 2012-04-16: qty 50

## 2012-04-16 MED ORDER — DIPHENHYDRAMINE HCL 50 MG/ML IJ SOLN: 25.0000 mg | INTRAMUSCULAR | Status: DC | PRN

## 2012-04-16 MED ORDER — EPHEDRINE SULFATE 50 MG/ML IJ SOLN
INTRAMUSCULAR | Status: DC | PRN
  Administered 2012-04-16 (×4): 5 mg via INTRAVENOUS

## 2012-04-16 MED ORDER — NALOXONE HCL 1 MG/ML IJ SOLN
1.0000 ug/kg/h | INTRAVENOUS | Status: DC | PRN
  Filled 2012-04-16: qty 2

## 2012-04-16 MED ORDER — DIBUCAINE 1 % RE OINT: 1.0000 "application " | TOPICAL_OINTMENT | RECTAL | Status: DC | PRN

## 2012-04-16 MED ORDER — OXYTOCIN 40 UNITS IN LACTATED RINGERS INFUSION - SIMPLE MED: 62.5000 mL/h | INTRAVENOUS | Status: AC

## 2012-04-16 MED ORDER — CEFAZOLIN SODIUM-DEXTROSE 2-3 GM-% IV SOLR: 2.0000 g | INTRAVENOUS | Status: DC

## 2012-04-16 MED ORDER — FENTANYL CITRATE 0.05 MG/ML IJ SOLN
INTRAMUSCULAR | Status: DC | PRN
  Administered 2012-04-16: 12.5 ug via INTRATHECAL

## 2012-04-16 MED ORDER — KETOROLAC TROMETHAMINE 60 MG/2ML IM SOLN
INTRAMUSCULAR | Status: AC
  Administered 2012-04-16: 60 mg via INTRAMUSCULAR
  Filled 2012-04-16: qty 2

## 2012-04-16 MED ORDER — OXYTOCIN 10 UNIT/ML IJ SOLN
40.0000 [IU] | INTRAVENOUS | Status: DC | PRN
  Administered 2012-04-16: 40 [IU] via INTRAVENOUS

## 2012-04-16 MED ORDER — PHENYLEPHRINE 40 MCG/ML (10ML) SYRINGE FOR IV PUSH (FOR BLOOD PRESSURE SUPPORT)
PREFILLED_SYRINGE | INTRAVENOUS | Status: AC
  Filled 2012-04-16: qty 5

## 2012-04-16 MED ORDER — SCOPOLAMINE 1 MG/3DAYS TD PT72
MEDICATED_PATCH | TRANSDERMAL | Status: AC
  Administered 2012-04-16: 1.5 mg via TRANSDERMAL
  Filled 2012-04-16: qty 1

## 2012-04-16 MED ORDER — LACTATED RINGERS IV SOLN
INTRAVENOUS | Status: DC
  Administered 2012-04-16 – 2012-04-17 (×2): via INTRAVENOUS

## 2012-04-16 MED ORDER — MEASLES, MUMPS & RUBELLA VAC ~~LOC~~ INJ: 0.5000 mL | INJECTION | Freq: Once | SUBCUTANEOUS | Status: DC

## 2012-04-16 MED ORDER — KETOROLAC TROMETHAMINE 60 MG/2ML IM SOLN
60.0000 mg | Freq: Once | INTRAMUSCULAR | Status: AC | PRN
  Administered 2012-04-16: 60 mg via INTRAMUSCULAR

## 2012-04-16 MED ORDER — KETOROLAC TROMETHAMINE 30 MG/ML IJ SOLN: 15.0000 mg | Freq: Once | INTRAMUSCULAR | Status: DC | PRN

## 2012-04-16 MED ORDER — ONDANSETRON HCL 4 MG/2ML IJ SOLN: 4.0000 mg | INTRAMUSCULAR | Status: DC | PRN

## 2012-04-16 MED ORDER — NALBUPHINE HCL 10 MG/ML IJ SOLN
5.0000 mg | INTRAMUSCULAR | Status: DC | PRN
Start: 2012-04-16 — End: 2012-04-19
  Filled 2012-04-16: qty 1

## 2012-04-16 MED ORDER — KETOROLAC TROMETHAMINE 30 MG/ML IJ SOLN
30.0000 mg | Freq: Four times a day (QID) | INTRAMUSCULAR | Status: AC | PRN
  Administered 2012-04-16: 30 mg via INTRAVENOUS
  Filled 2012-04-16: qty 1

## 2012-04-16 MED ORDER — OXYCODONE-ACETAMINOPHEN 5-325 MG PO TABS
1.0000 | ORAL_TABLET | ORAL | Status: DC | PRN
  Administered 2012-04-17 – 2012-04-19 (×4): 1 via ORAL
  Filled 2012-04-16 (×4): qty 1

## 2012-04-16 MED ORDER — SCOPOLAMINE 1 MG/3DAYS TD PT72
1.0000 | MEDICATED_PATCH | TRANSDERMAL | Status: DC
  Administered 2012-04-16: 1.5 mg via TRANSDERMAL

## 2012-04-16 MED ORDER — NALBUPHINE SYRINGE 5 MG/0.5 ML
INJECTION | INTRAMUSCULAR | Status: AC
  Administered 2012-04-16: 5 mg via SUBCUTANEOUS
  Filled 2012-04-16: qty 0.5

## 2012-04-16 MED ORDER — SIMETHICONE 80 MG PO CHEW: 80.0000 mg | CHEWABLE_TABLET | ORAL | Status: DC | PRN

## 2012-04-16 MED ORDER — SODIUM CHLORIDE 0.9 % IJ SOLN: 3.0000 mL | INTRAMUSCULAR | Status: DC | PRN

## 2012-04-16 MED ORDER — FENTANYL CITRATE 0.05 MG/ML IJ SOLN: INTRAMUSCULAR | Status: DC | PRN

## 2012-04-16 MED ORDER — DIPHENHYDRAMINE HCL 25 MG PO CAPS
25.0000 mg | ORAL_CAPSULE | ORAL | Status: DC | PRN
  Administered 2012-04-16: 25 mg via ORAL
  Filled 2012-04-16: qty 1

## 2012-04-16 MED ORDER — MENTHOL 3 MG MT LOZG: 1.0000 | LOZENGE | OROMUCOSAL | Status: DC | PRN

## 2012-04-16 MED ORDER — NALBUPHINE HCL 10 MG/ML IJ SOLN
5.0000 mg | INTRAMUSCULAR | Status: DC | PRN
  Administered 2012-04-16: 5 mg via SUBCUTANEOUS
  Filled 2012-04-16: qty 1

## 2012-04-16 MED ORDER — DIPHENHYDRAMINE HCL 50 MG/ML IJ SOLN: 12.5000 mg | INTRAMUSCULAR | Status: DC | PRN

## 2012-04-16 MED ORDER — IBUPROFEN 600 MG PO TABS
600.0000 mg | ORAL_TABLET | Freq: Four times a day (QID) | ORAL | Status: DC
  Administered 2012-04-16 – 2012-04-19 (×10): 600 mg via ORAL
  Filled 2012-04-16 (×10): qty 1

## 2012-04-16 MED ORDER — FENTANYL CITRATE 0.05 MG/ML IJ SOLN
INTRAMUSCULAR | Status: AC
  Filled 2012-04-16: qty 2

## 2012-04-16 MED ORDER — OXYTOCIN 10 UNIT/ML IJ SOLN
INTRAMUSCULAR | Status: AC
  Filled 2012-04-16: qty 4

## 2012-04-16 MED ORDER — PROMETHAZINE HCL 25 MG/ML IJ SOLN: 6.2500 mg | INTRAMUSCULAR | Status: DC | PRN

## 2012-04-16 SURGICAL SUPPLY — 32 items
CLOTH BEACON ORANGE TIMEOUT ST (SAFETY) ×2 IMPLANT
CONTAINER PREFILL 10% NBF 15ML (MISCELLANEOUS) IMPLANT
DRAIN JACKSON PRT FLT 7MM (DRAIN) IMPLANT
DRAPE LG THREE QUARTER DISP (DRAPES) ×2 IMPLANT
DRESSING TELFA 8X3 (GAUZE/BANDAGES/DRESSINGS) IMPLANT
DRSG OPSITE POSTOP 4X10 (GAUZE/BANDAGES/DRESSINGS) ×2 IMPLANT
DURAPREP 26ML APPLICATOR (WOUND CARE) ×2 IMPLANT
ELECT REM PT RETURN 9FT ADLT (ELECTROSURGICAL) ×2
ELECTRODE REM PT RTRN 9FT ADLT (ELECTROSURGICAL) ×1 IMPLANT
EVACUATOR SILICONE 100CC (DRAIN) IMPLANT
EXTRACTOR VACUUM M CUP 4 TUBE (SUCTIONS) IMPLANT
GAUZE SPONGE 4X4 12PLY STRL LF (GAUZE/BANDAGES/DRESSINGS) IMPLANT
GLOVE BIO SURGEON STRL SZ7 (GLOVE) ×2 IMPLANT
GLOVE BIOGEL PI IND STRL 7.0 (GLOVE) ×2 IMPLANT
GLOVE BIOGEL PI INDICATOR 7.0 (GLOVE) ×2
GOWN PREVENTION PLUS LG XLONG (DISPOSABLE) ×6 IMPLANT
KIT ABG SYR 3ML LUER SLIP (SYRINGE) IMPLANT
NEEDLE HYPO 25X5/8 SAFETYGLIDE (NEEDLE) IMPLANT
NS IRRIG 1000ML POUR BTL (IV SOLUTION) ×2 IMPLANT
PACK C SECTION WH (CUSTOM PROCEDURE TRAY) ×2 IMPLANT
PAD ABD 7.5X8 STRL (GAUZE/BANDAGES/DRESSINGS) IMPLANT
PAD OB MATERNITY 4.3X12.25 (PERSONAL CARE ITEMS) ×2 IMPLANT
RTRCTR C-SECT PINK 25CM LRG (MISCELLANEOUS) ×2 IMPLANT
SLEEVE SCD COMPRESS KNEE MED (MISCELLANEOUS) IMPLANT
STAPLER VISISTAT 35W (STAPLE) IMPLANT
SUT MNCRL AB 0 CT1 27 (SUTURE) ×2 IMPLANT
SUT VIC AB 0 CTX 36 (SUTURE) ×5
SUT VIC AB 0 CTX36XBRD ANBCTRL (SUTURE) ×5 IMPLANT
SUT VIC AB 4-0 KS 27 (SUTURE) IMPLANT
TOWEL OR 17X24 6PK STRL BLUE (TOWEL DISPOSABLE) ×6 IMPLANT
TRAY FOLEY CATH 14FR (SET/KITS/TRAYS/PACK) ×2 IMPLANT
WATER STERILE IRR 1000ML POUR (IV SOLUTION) IMPLANT

## 2012-04-16 NOTE — Anesthesia Procedure Notes (Signed)
Spinal  Patient location during procedure: OR Start time: 04/16/2012 9:20 AM End time: 04/16/2012 9:24 AM Staffing Anesthesiologist: Sandrea Hughs Performed by: anesthesiologist  Preanesthetic Checklist Completed: patient identified, site marked, surgical consent, pre-op evaluation, timeout performed, IV checked, risks and benefits discussed and monitors and equipment checked Spinal Block Patient position: sitting Prep: DuraPrep Patient monitoring: heart rate, cardiac monitor, continuous pulse ox and blood pressure Approach: midline Location: L3-4 Injection technique: single-shot Needle Needle type: Sprotte  Needle gauge: 24 G Needle length: 9 cm Needle insertion depth: 6 cm Assessment Sensory level: T6

## 2012-04-16 NOTE — Anesthesia Preprocedure Evaluation (Signed)
Anesthesia Evaluation  Patient identified by MRN, date of birth, ID band Patient awake    Reviewed: Allergy & Precautions, H&P , NPO status , Patient's Chart, lab work & pertinent test results  Airway Mallampati: II TM Distance: >3 FB Neck ROM: full    Dental No notable dental hx.    Pulmonary    Pulmonary exam normal       Cardiovascular negative cardio ROS      Neuro/Psych negative psych ROS   GI/Hepatic negative GI ROS, Neg liver ROS,   Endo/Other  negative endocrine ROS  Renal/GU negative Renal ROS  negative genitourinary   Musculoskeletal negative musculoskeletal ROS (+)   Abdominal Normal abdominal exam  (+)   Peds negative pediatric ROS (+)  Hematology negative hematology ROS (+)   Anesthesia Other Findings   Reproductive/Obstetrics (+) Pregnancy                           Anesthesia Physical Anesthesia Plan  ASA: II  Anesthesia Plan: Spinal   Post-op Pain Management:    Induction:   Airway Management Planned:   Additional Equipment:   Intra-op Plan:   Post-operative Plan:   Informed Consent: I have reviewed the patients History and Physical, chart, labs and discussed the procedure including the risks, benefits and alternatives for the proposed anesthesia with the patient or authorized representative who has indicated his/her understanding and acceptance.     Plan Discussed with: CRNA and Surgeon  Anesthesia Plan Comments:         Anesthesia Quick Evaluation

## 2012-04-16 NOTE — Preoperative (Signed)
Beta Blockers   Reason not to administer Beta Blockers:Not Applicable 

## 2012-04-16 NOTE — Addendum Note (Signed)
Addendum  created 04/16/12 1827 by Collier Flowers, CRNA   Modules edited:Notes Section

## 2012-04-16 NOTE — Anesthesia Postprocedure Evaluation (Signed)
  Anesthesia Post-op Note  Patient: Cindy Walker, broadcasting  Procedure(s) Performed: Procedure(s) (LRB) with comments: CESAREAN SECTION WITH BILATERAL TUBAL LIGATION (N/A) - Repeat  Patient Location: PACU  Anesthesia Type:Spinal  Level of Consciousness: awake, alert  and oriented  Airway and Oxygen Therapy: Patient Spontanous Breathing  Post-op Pain: none  Post-op Assessment: Post-op Vital signs reviewed, Patient's Cardiovascular Status Stable, Respiratory Function Stable, Patent Airway, No signs of Nausea or vomiting, Pain level controlled, No headache and No backache  Post-op Vital Signs: Reviewed and stable  Complications: No apparent anesthesia complications

## 2012-04-16 NOTE — Anesthesia Postprocedure Evaluation (Signed)
  Anesthesia Post-op Note  Patient: Cindy Walker, broadcasting  Procedure(s) Performed: Procedure(s) (LRB) with comments: CESAREAN SECTION WITH BILATERAL TUBAL LIGATION (N/A) - Repeat  Patient Location: Mother/Baby  Anesthesia Type:Spinal  Level of Consciousness: awake, alert , oriented and patient cooperative  Airway and Oxygen Therapy: Patient Spontanous Breathing  Post-op Pain: mild  Post-op Assessment: Patient's Cardiovascular Status Stable, Respiratory Function Stable, Patent Airway, No signs of Nausea or vomiting, Adequate PO intake and Pain level controlled  Post-op Vital Signs: Reviewed and stable  Complications: No apparent anesthesia complications

## 2012-04-16 NOTE — H&P (Signed)
Ayme Short is a 27 y.o. female presenting for elec5tive rpt c/s at 39 weeks 2 days [facility] 04/21/12).  History OB History    Grav Para Term Preterm Abortions TAB SAB Ect Mult Living   2 1 1        0     Past Medical History  Diagnosis Date  . Headache   . Asthma   . Fibrocystic breast    Past Surgical History  Procedure Date  . Cesarean section    Family History: family history includes Cancer in her maternal grandfather; Diabetes in her maternal aunt; and Heart disease in her maternal grandfather, maternal grandmother, paternal grandfather, and paternal grandmother. Social History:  reports that she quit smoking about 3 weeks ago. Her smoking use included Cigarettes. She has a 3.5 pack-year smoking history. She has never used smokeless tobacco. She reports that she does not drink alcohol or use illicit drugs.   Prenatal Transfer Tool  Maternal Diabetes: No Genetic Screening: Normal Maternal Ultrasounds/Referrals: Normal Fetal Ultrasounds or other Referrals:  None Maternal Substance Abuse:  No Significant Maternal Medications:  None Significant Maternal Lab Results:  None Other Comments:  None  Review of Systems  Constitutional: Negative.   Cardiovascular: Negative.   Genitourinary: Negative.       Blood pressure 126/78, pulse 84, temperature 98.1 F (36.7 C), temperature source Oral, resp. rate 18, last menstrual period 07/16/2011, SpO2 99.00%. Maternal Exam:  Abdomen: Surgical scars: low transverse.     Physical Exam  Vitals reviewed. Constitutional: She is oriented to person, place, and time. She appears well-developed and well-nourished.  HENT:  Head: Normocephalic and atraumatic.  Eyes: Conjunctivae normal are normal.  Neck: Neck supple. No thyromegaly present.  Cardiovascular: Normal rate and regular rhythm.   Respiratory: Effort normal and breath sounds normal.  GI: Soft. There is no tenderness.  Musculoskeletal: She exhibits no edema.  Neurological:  She is alert and oriented to person, place, and time.  Skin: Skin is warm and dry.  Psychiatric: She has a normal mood and affect.    Prenatal labs: ABO, Rh: --/--/A POS (01/24 0855) Antibody: NEG (01/24 0849) Rubella: Immune (06/13 1656) RPR: NON REACTIVE (01/24 0849)  HBsAg: Negative (06/13 1656)  HIV: Non-reactive (06/13 1656)  GBS:     Assessment/Plan: 26 G2P1000 @ 39 weeks for rpt c/s.  The risks of cesarean section discussed with the patient included but were not limited to: bleeding which may require transfusion or reoperation; infection which may require antibiotics; injury to bowel, bladder, ureters or other surrounding organs; injury to the fetus; need for additional procedures including hysterectomy in the event of a life-threatening hemorrhage; placental abnormalities wth subsequent pregnancies, incisional problems, thromboembolic phenomenon and other postoperative/anesthesia complications. The patient concurred with the proposed plan, giving informed written consent for the procedure.    Laruen Risser H. 04/16/2012, 8:59 AM

## 2012-04-16 NOTE — Op Note (Signed)
[name] PROCEDURE DATE: 04/16/2012  PREOPERATIVE DIAGNOSIS: Intrauterine pregnancy at  [redacted]w[redacted]d weeks gestation  POSTOPERATIVE DIAGNOSIS: The same  PROCEDURE:    Low Transverse Cesarean Section  SURGEON:  Dr. Elsie Lincoln  ASSISTANT:   INDICATIONS: Cindy Walker is a 27 y.o. G2P1000 at [redacted]w[redacted]d scheduled for cesarean section secondary to elective repeat.  The risks of cesarean section discussed with the patient included but were not limited to: bleeding which may require transfusion or reoperation; infection which may require antibiotics; injury to bowel, bladder, ureters or other surrounding organs; injury to the fetus; need for additional procedures including hysterectomy in the event of a life-threatening hemorrhage; placental abnormalities wth subsequent pregnancies, incisional problems, thromboembolic phenomenon and other postoperative/anesthesia complications. The patient concurred with the proposed plan, giving informed written consent for the procedure.    FINDINGS:  Viable female infant in cephalic presentation, 9 at 1 minute, 9 at 5 minutes Dark brown amniotic fluid.  Intact placenta with questionable area of abruption, three vessel cord.  Grossly normal uterus, ovaries and fallopian tubes. .   ANESTHESIA:    Epidural ESTIMATED BLOOD LOSS: 800* ml SPECIMENS: Placenta sent to pathology COMPLICATIONS: None immediate  PROCEDURE IN DETAIL:  The patient received intravenous antibiotics and had sequential compression devices applied to her lower extremities while in the preoperative area.  She was then taken to the operating room where spinal anesthesia was administered. She was then placed in a dorsal supine position with a leftward tilt, and prepped and draped in a sterile manner.  A foley catheter was placed into her bladder and attached to constant gravity.  After an adequate timeout was performed, a Pfannenstiel skin incision was made with scalpel and carried through to the  underlying layer of fascia. The fascia was incised in the midline and this incision was extended bilaterally using the Mayo scissors. Kocher clamps were applied to the superior aspect of the fascial incision and the underlying rectus muscles were dissected off bluntly. A similar process was carried out on the inferior aspect of the facial incision. The rectus muscles were separated in the midline bluntly and the peritoneum was entered bluntly.   A transverse hysterotomy was made with a scalpel and extended bilaterally bluntly. The bladder blade was then removed. The infant was successfully delivered, and cord was clamped and cut and infant was handed over to awaiting neonatology team. Uterine massage was then administered and the placenta delivered intact with three-vessel cord. The uterus was cleared of clot and debris.  The hysterotomy was closed with 0 vicryl.  There was good restoration of anatomy.  The peritoneum and rectus muscles were noted to be hemostatic.  The fascia was closed with 0-Vicryl in a running fashion with good restoration of anatomy.  The subcutaneus tissue was copiously irrigated.  The skin was closed with 4-0 Vicryl in a subcuticular fashion.  Pt tolerated the procedure will.  All counts were correct x2.  Pt went to the recovery room in stable condition.

## 2012-04-16 NOTE — Transfer of Care (Signed)
Immediate Anesthesia Transfer of Care Note  Patient: Cindy Walker  Procedure(s) Performed: Procedure(s) (LRB) with comments: CESAREAN SECTION WITH BILATERAL TUBAL LIGATION (N/A) - Repeat  Patient Location: PACU  Anesthesia Type:Spinal  Level of Consciousness: awake, alert , oriented and patient cooperative  Airway & Oxygen Therapy: Patient Spontanous Breathing  Post-op Assessment: Report given to PACU RN and Post -op Vital signs reviewed and stable  Post vital signs: Reviewed and stable  Complications: No apparent anesthesia complications

## 2012-04-17 ENCOUNTER — Encounter (HOSPITAL_COMMUNITY): Payer: Self-pay | Admitting: Obstetrics & Gynecology

## 2012-04-17 LAB — TYPE AND SCREEN
ABO/RH(D): A POS
Unit division: 0

## 2012-04-17 LAB — CBC
HCT: 31.6 % — ABNORMAL LOW (ref 36.0–46.0)
MCH: 29.2 pg (ref 26.0–34.0)
MCV: 87.8 fL (ref 78.0–100.0)
Platelets: 218 10*3/uL (ref 150–400)
RBC: 3.6 MIL/uL — ABNORMAL LOW (ref 3.87–5.11)

## 2012-04-17 NOTE — Progress Notes (Signed)
Chaplain paged at 1558 and arrived in unit at 1. Visit was focused on the mixed feelings Ms Hassan has birthing a baby while still feeling grief over the lost of a child a year ago. She admits to mixed feelings. Some things about the new experience of this child, draws grief responses at memory of her other child. Chaplain, mother and father talked openly about separating one from the other, allowing grief responses to flow. Notable is Ms Minney's apprehension over this pregnancy every step of the way and the fear that something may happen to this child. She teared as she told the story admitting that she is trying to put on the best face and hinder any joy or celebration.   Ms Eubanks is a person of strong Christian faith. She attends a [employer] which has provide support in her grief and is providing support in her joy.  The Trollingers wish to compliment the staff of [facility] for their exceptional care. Ms. Surman says this birthing experience was all she wished and wanted it to be. Special thanks to the Nursing Staff for their gentle and loving care.  Recommend follow up visit by daytime chaplains.  Benjie Karvonen. Tiandra Swoveland, M.Div., D.Min [employer]

## 2012-04-17 NOTE — Progress Notes (Signed)
CSW received referral to complete assessment for hx of a child dying from surgery complications.  CSW spoke with bedside RN to see if any emotion concerns have been noted.  She denies.  CSW feels this is not the appropriate time to discuss her child's death and is screening out the referral, but requests of bedside RN to please call CSW if any concerns arise or if MOB would like to speak with a CSW.

## 2012-04-17 NOTE — Progress Notes (Signed)
Subjective: Postpartum Day 1: Cesarean Delivery due to elective repeat Patient reports tolerating PO, + flatus and no problems voiding.  Pain well controlled with medication. Ambulating. Breastfeeding going well  Objective: Vital signs in last 24 hours: Temp:  [97.2 F (36.2 C)-99.1 F (37.3 C)] 97.2 F (36.2 C) (01/28 0500) Pulse Rate:  [55-84] 67  (01/28 0500) Resp:  [16-20] 18  (01/28 0500) BP: (101-149)/(62-91) 116/76 mmHg (01/28 0500) SpO2:  [96 %-100 %] 96 % (01/28 0500) Weight:  [89.359 kg (197 lb)] 89.359 kg (197 lb) (01/27 1217)  Physical Exam:  General: alert, cooperative and no distress Lochia: appropriate Uterine Fundus: firm Incision: no significant drainage, no dehiscence, no significant erythema DVT Evaluation: No evidence of DVT seen on physical exam. No cords or calf tenderness. No significant calf/ankle edema.   Basename 04/17/12 0555  HGB 10.5*  HCT 31.6*   Pre-operative Hgb 11.3  Assessment/Plan: Status post Cesarean section. Doing well postoperatively.  Continue current care. Plans minipill for birth control.  Bonnita Nasuti 04/17/2012, 7:23 AM

## 2012-04-17 NOTE — Progress Notes (Signed)
I have seen the patient with the resident/student and agree with the above.   

## 2012-04-18 NOTE — Progress Notes (Signed)
Post Partum Day 2: Cesarean Delivery due to elective repeat  Subjective: Patient reports tolerating PO, + flatus and no problems voiding. Pain well controlled with medication. Ambulating. Breastfeeding going well  Objective: Blood pressure 117/79, pulse 53, temperature 98.1 F (36.7 C), temperature source Oral, resp. rate 18, height 5\' 3"  (1.6 m), weight 205 lb (92.987 kg), last menstrual period 07/16/2011, SpO2 96.00%, unknown if currently breastfeeding.  Physical Exam:  General: alert, cooperative and no distress Lochia: appropriate Uterine Fundus: firm Incision: no significant drainage, no dehiscence, no significant erythema DVT Evaluation: No evidence of DVT seen on physical exam.   Basename 04/17/12 0555  HGB 10.5*  HCT 31.6*    Assessment/Plan: Plan for discharge tomorrow, Breastfeeding and Contraception minipill   LOS: 2 days   Natosha Bou 04/18/2012, 7:33 AM

## 2012-04-18 NOTE — Progress Notes (Signed)
04/18/12 1000  Clinical Encounter Type  Visited With Patient and family together (Husband [place])  Visit Type Follow-up;Spiritual support;Social support  Referral From Chaplain (Charlie Lumpkin, DMin)  Spiritual Encounters  Spiritual Needs Emotional;Grief support    Thank you to overnight chaplain Charlie Lumpkin for referral for follow-up support.  Provided opportunity for Mckena and Gaynelle Adu to share some about their individual and shared processes of coping with grief and healing.  They report good support from family, as well as prayer support from the church they attend.    Provided pastoral presence, empathic listening, encouragement, and affirmation.  Family is aware of ongoing chaplain availability.  [address] [place], [place] [phone]

## 2012-04-18 NOTE — Progress Notes (Signed)
I have seen and examined this patient and I agree with the above. Pt declines early d/c due to weather conditions. Cam Hai 7:49 AM 04/18/2012

## 2012-04-18 NOTE — Plan of Care (Signed)
Problem: Consults Goal: Lactation Consult Initiated if indicated Outcome: Completed/Met Date Met:  04/18/12 Mother plans to pump and bottle feed

## 2012-04-19 MED ORDER — OXYCODONE-ACETAMINOPHEN 5-325 MG PO TABS: 1.0000 | ORAL_TABLET | ORAL | Status: DC | PRN

## 2012-04-19 MED ORDER — IBUPROFEN 600 MG PO TABS: 600.0000 mg | ORAL_TABLET | Freq: Four times a day (QID) | ORAL | Status: DC

## 2012-04-19 NOTE — Discharge Summary (Signed)
Obstetric Discharge Summary Reason for Admission: cesarean section. Elective repeat. Prenatal Procedures: NST Intrapartum Procedures: cesarean: low cervical, transverse Postpartum Procedures: none Complications-Operative and Postpartum: none Hemoglobin  Date Value Range Status  04/17/2012 10.5* 12.0 - 15.0 g/dL Final     HCT  Date Value Range Status  04/17/2012 31.6* 36.0 - 46.0 % Final   Hospital Course:   Pt was admitted for RLTCS;  Findings:  Viable female infant in cephalic presentation, 9 at 1 minute, 9 at 5 minutes  Dark brown amniotic fluid. Intact placenta with questionable area of abruption, three vessel cord. Grossly normal uterus, ovaries and fallopian tubes. POD #1&2 were uneventful, with appropriate progress made Physical Exam:  General: alert, cooperative and no distress Lochia: appropriate Uterine Fundus: firm Incision: no significant drainage, no dehiscence, no significant erythema DVT Evaluation: No evidence of DVT seen on physical exam.  Discharge Diagnoses: Term Pregnancy-delivered by cesarean section  Discharge Information: Date: 04/19/2012 Activity: pelvic rest Diet: routine Medications: PNV, Ibuprofen, Colace and Percocet Condition: stable Instructions: refer to practice specific booklet Discharge to: home Follow-up Information    Follow up with [facility]. Schedule an appointment as soon as possible for a visit in 6 weeks.   Contact information:   [address] [place] [address] [phone]         Newborn Data: Live born female  Birth Weight: 7 lb 1.4 oz (3215 g) APGAR: 9, 9  Home with mother. Breastfeeding well.  Cindy Walker 04/19/2012, 7:42 AM  I have seen and examined this patient and agree the above assessment. Cindy Walker 04/19/2012 7:53 AM

## 2012-04-23 NOTE — Discharge Summary (Signed)
Attestation of Attending Supervision of Advanced Practitioner: Evaluation and management procedures were performed by the PA/NP/CNM/OB Fellow under my supervision/collaboration. Chart reviewed and agree with management and plan.  Tilda Burrow 04/23/2012 6:32 PM

## 2012-04-27 ENCOUNTER — Inpatient Hospital Stay (HOSPITAL_COMMUNITY): Admission: AD | Admit: 2012-04-27 | Payer: Self-pay | Source: Ambulatory Visit | Admitting: Family Medicine

## 2012-05-05 ENCOUNTER — Other Ambulatory Visit: Payer: Self-pay

## 2012-05-30 ENCOUNTER — Ambulatory Visit (INDEPENDENT_AMBULATORY_CARE_PROVIDER_SITE_OTHER): Payer: 59 | Admitting: Family Medicine

## 2012-05-30 ENCOUNTER — Encounter: Payer: Self-pay | Admitting: Family Medicine

## 2012-05-30 VITALS — BP 118/71 | HR 71 | Resp 20 | Ht 63.0 in | Wt 179.6 lb

## 2012-05-30 DIAGNOSIS — N6009 Solitary cyst of unspecified breast: Secondary | ICD-10-CM

## 2012-05-30 DIAGNOSIS — N6002 Solitary cyst of left breast: Secondary | ICD-10-CM

## 2012-05-30 NOTE — Progress Notes (Signed)
  Subjective:     Cindy Walker is a 27 y.o. female who presents for a postpartum visit. She is 6 weeks postpartum following a low cervical transverse Cesarean section. I have fully reviewed the prenatal and intrapartum course. The delivery was at 39 gestational weeks. Outcome: repeat cesarean section, low transverse incision. Anesthesia: spinal. Postpartum course has been normal. Baby's course has been normal. Baby is feeding by breast. Bleeding no bleeding. Bowel function is normal. Bladder function is normal. Patient is sexually active. Contraception method is condoms. Postpartum depression screening: negative. Pt. Has known right breast cyst that she would like to have removed.  She has a h/o u/s and mammogram previously.  With pregnancy it grows very rapily and she would like it removed prior to next pregancy. The following portions of the patient's history were reviewed and updated as appropriate: allergies, current medications, past family history, past medical history, past social history, past surgical history and problem list.  Review of Systems A comprehensive review of systems was negative.   Objective:    BP 118/71  Pulse 71  Resp 20  Ht 5\' 3"  (1.6 m)  Wt 179 lb 9.6 oz (81.466 kg)  BMI 31.82 kg/m2  General:  alert, cooperative and appears stated age   Breasts:  positive findings: smooth, rubbery, mobile, non-tender and 8 x 4 cm left breast peri-areolar from 9-1 o'clock nodule located on the left upper inner quadrant  Lungs: clear to auscultation bilaterally  Heart:  regular rate and rhythm, S1, S2 normal, no murmur, click, rub or gallop  Abdomen: soft, non-tender; bowel sounds normal; no masses,  no organomegaly   Vulva:  normal  Vagina: normal vagina  Cervix:  no cervical motion tenderness, no lesions and nulliparous appearance  Corpus: normal size, contour, position, consistency, mobility, non-tender  Adnexa:  no mass, fullness, tenderness           Assessment:    Normal postpartum exam. Pap smear not done at today's visit.   Plan:    1. Contraception: condoms 2. Left breast U/S and referral to gen surg for removal. 3. Follow up in: 6 months for pap smear or as needed.

## 2012-05-30 NOTE — Progress Notes (Signed)
Pt wants breast cyst evaluated for possible removal

## 2012-06-01 ENCOUNTER — Ambulatory Visit
Admission: RE | Admit: 2012-06-01 | Discharge: 2012-06-01 | Disposition: A | Discharge status: Home / Self Care | Payer: 59 | Source: Ambulatory Visit | Attending: Family Medicine | Admitting: Family Medicine

## 2012-06-01 DIAGNOSIS — N6002 Solitary cyst of left breast: Secondary | ICD-10-CM

## 2012-06-04 ENCOUNTER — Encounter (INDEPENDENT_AMBULATORY_CARE_PROVIDER_SITE_OTHER): Payer: Self-pay | Admitting: Surgery

## 2012-06-04 ENCOUNTER — Ambulatory Visit (INDEPENDENT_AMBULATORY_CARE_PROVIDER_SITE_OTHER): Payer: 59 | Admitting: Surgery

## 2012-06-04 VITALS — BP 116/88 | HR 84 | Temp 96.8°F | Resp 16 | Ht 63.0 in | Wt 183.0 lb

## 2012-06-04 DIAGNOSIS — N6320 Unspecified lump in the left breast, unspecified quadrant: Secondary | ICD-10-CM | POA: Insufficient documentation

## 2012-06-04 DIAGNOSIS — N63 Unspecified lump in unspecified breast: Secondary | ICD-10-CM

## 2012-06-04 NOTE — Progress Notes (Signed)
Patient ID: Cindy Walker, female   DOB: 09-Nov-1985, 27 y.o.   MRN: [id number]  Chief Complaint  Patient presents with  . Other    left breast mass    HPI Cindy Walker is a 27 y.o. female.   HPI This is a very pleasant female referred by Dr. Shawnie Pons for evaluation of a left breast mass. The patient has had the mass for many years but recently got much larger during her most recent pregnancy. She is currently breast-feeding without any issues. She has mild discomfort. There is no bloody nipple discharge. She is otherwise without complaints and has had no previous problems with her breasts. Her family history is negative for breast cancer Past Medical History  Diagnosis Date  . Headache   . Asthma   . Fibrocystic breast   . Breast disorder     cyst on Lt breast    Past Surgical History  Procedure Laterality Date  . Cesarean section with bilateral tubal ligation  04/16/2012    C/S only - no BTL done  . Cesarean section  01/11/11    No BTL done  . Cesarean section  04/16/12    Family History  Problem Relation Age of Onset  . Diabetes Maternal Aunt   . Heart disease Maternal Grandmother   . Cancer Maternal Grandfather     liver cancer  . Heart disease Maternal Grandfather   . Heart disease Paternal Grandmother   . Heart disease Paternal Grandfather     Social History History  Substance Use Topics  . Smoking status: Former Smoker -- 0.50 packs/day for 7 years    Types: Cigarettes    Quit date: 12/19/2008  . Smokeless tobacco: Never Used  . Alcohol Use: Yes     Comment: 1-2 per week    No Known Allergies  Current Outpatient Prescriptions  Medication Sig Dispense Refill  . albuterol (PROVENTIL HFA;VENTOLIN HFA) 108 (90 BASE) MCG/ACT inhaler Inhale 2 puffs into the lungs every 6 (six) hours as needed.      . Fenugreek 610 MG CAPS Take 610 mg by mouth 2 (two) times daily.      Marland Kitchen ibuprofen (ADVIL,MOTRIN) 600 MG tablet Take 1 tablet (600 mg total) by mouth every 6  (six) hours.  60 tablet  0  . Prenatal Vit-Fe Fumarate-FA (MULTIVITAMIN-PRENATAL) 27-0.8 MG TABS Take 1 tablet by mouth daily.       No current facility-administered medications for this visit.    Review of Systems Review of Systems  Constitutional: Negative for fever, chills and unexpected weight change.  HENT: Negative for hearing loss, congestion, sore throat, trouble swallowing and voice change.   Eyes: Negative for visual disturbance.  Respiratory: Positive for wheezing. Negative for cough.   Cardiovascular: Negative for chest pain, palpitations and leg swelling.  Gastrointestinal: Negative for nausea, vomiting, abdominal pain, diarrhea, constipation, blood in stool, abdominal distention and anal bleeding.  Genitourinary: Negative for hematuria, vaginal bleeding and difficulty urinating.  Musculoskeletal: Negative for arthralgias.  Skin: Negative for rash and wound.  Neurological: Negative for seizures, syncope and headaches.  Hematological: Negative for adenopathy. Does not bruise/bleed easily.  Psychiatric/Behavioral: Negative for confusion.    Blood pressure 116/88, pulse 84, temperature 96.8 F (36 C), temperature source Temporal, resp. rate 16, height 5\' 3"  (1.6 m), weight 183 lb (83.008 kg), SpO2 98.00%.  Physical Exam Physical Exam  Constitutional: She is oriented to person, place, and time. She appears well-developed and well-nourished. No distress.  HENT:  Head: Normocephalic and  atraumatic.  Right Ear: External ear normal.  Left Ear: External ear normal.  Nose: Nose normal.  Mouth/Throat: Oropharynx is clear and moist. No oropharyngeal exudate.  Eyes: Conjunctivae are normal. Pupils are equal, round, and reactive to light. Right eye exhibits no discharge. Left eye exhibits no discharge. No scleral icterus.  Neck: Normal range of motion. Neck supple. No tracheal deviation present. No thyromegaly present.  Cardiovascular: Normal rate, regular rhythm, normal heart  sounds and intact distal pulses.   No murmur heard. Pulmonary/Chest: Effort normal and breath sounds normal. No respiratory distress. She has no wheezes.  Abdominal: Soft. Bowel sounds are normal. She exhibits no distension. There is no tenderness. There is no rebound.  Musculoskeletal: Normal range of motion. She exhibits no edema and no tenderness.  Lymphadenopathy:    She has no cervical adenopathy.    She has no axillary adenopathy.  Neurological: She is alert and oriented to person, place, and time.  Skin: Skin is warm and dry. No rash noted. She is not diaphoretic. No erythema.  Psychiatric: Her behavior is normal. Judgment normal.  Breasts: There is a 6-7 cm mass in the left breast at the 9:00 position underneath the lateral edge of the areola.  Data Reviewed I have reviewed the patient's ultrasound demonstrated a solid 7 severe mass in the left breast. There are no other abnormalities  Assessment    Large left breast mass     Plan    I do suspect this is a benign fibroadenoma. Removal is recommended for histologic evaluation to rule out a sarcoma. I discussed the surgical procedure with her in detail. I discussed the risks which includes is not limited to bleeding, infection, recurrence, etc. She understands and wishes to proceed. Likelihood of success is good        Mitsugi Schrader A 06/04/2012, 9:55 AM

## 2012-07-25 ENCOUNTER — Other Ambulatory Visit (INDEPENDENT_AMBULATORY_CARE_PROVIDER_SITE_OTHER): Payer: Self-pay | Admitting: Surgery

## 2012-07-25 ENCOUNTER — Other Ambulatory Visit (HOSPITAL_COMMUNITY)
Admission: RE | Admit: 2012-07-25 | Discharge: 2012-07-25 | Disposition: A | Discharge status: Home / Self Care | Payer: 59 | Source: Ambulatory Visit | Attending: Surgery | Admitting: Surgery

## 2012-07-25 DIAGNOSIS — D249 Benign neoplasm of unspecified breast: Secondary | ICD-10-CM

## 2012-07-25 DIAGNOSIS — N63 Unspecified lump in unspecified breast: Secondary | ICD-10-CM | POA: Insufficient documentation

## 2012-08-10 ENCOUNTER — Ambulatory Visit (INDEPENDENT_AMBULATORY_CARE_PROVIDER_SITE_OTHER): Payer: 59 | Admitting: Surgery

## 2012-08-10 ENCOUNTER — Encounter (INDEPENDENT_AMBULATORY_CARE_PROVIDER_SITE_OTHER): Payer: Self-pay | Admitting: Surgery

## 2012-08-10 VITALS — BP 112/66 | HR 64 | Temp 98.6°F | Resp 12 | Ht 63.0 in | Wt 167.4 lb

## 2012-08-10 DIAGNOSIS — Z09 Encounter for follow-up examination after completed treatment for conditions other than malignant neoplasm: Secondary | ICD-10-CM

## 2012-08-10 NOTE — Progress Notes (Signed)
Subjective:     Patient ID: Cindy Walker, female   DOB: 1985-07-17, 27 y.o.   MRN: [id number]  HPI She is here for her first postop visit status post excision of left breast mass. 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  She may resume normal activity.

## 2012-11-27 ENCOUNTER — Encounter: Payer: Self-pay | Admitting: Obstetrics & Gynecology

## 2012-11-27 ENCOUNTER — Other Ambulatory Visit: Payer: Self-pay | Admitting: Obstetrics & Gynecology

## 2012-11-27 ENCOUNTER — Ambulatory Visit (INDEPENDENT_AMBULATORY_CARE_PROVIDER_SITE_OTHER): Payer: 59 | Admitting: Obstetrics & Gynecology

## 2012-11-27 VITALS — BP 110/72 | HR 55 | Ht 63.0 in | Wt 156.0 lb

## 2012-11-27 DIAGNOSIS — Z124 Encounter for screening for malignant neoplasm of cervix: Secondary | ICD-10-CM

## 2012-11-27 DIAGNOSIS — Z01419 Encounter for gynecological examination (general) (routine) without abnormal findings: Secondary | ICD-10-CM

## 2012-11-27 DIAGNOSIS — IMO0001 Reserved for inherently not codable concepts without codable children: Secondary | ICD-10-CM

## 2012-11-27 DIAGNOSIS — Z309 Encounter for contraceptive management, unspecified: Secondary | ICD-10-CM

## 2012-11-27 MED ORDER — NORGESTIM-ETH ESTRAD TRIPHASIC 0.18/0.215/0.25 MG-35 MCG PO TABS: 1.0000 | ORAL_TABLET | Freq: Every day | ORAL | Status: DC

## 2012-11-27 NOTE — Patient Instructions (Signed)
Preventive Care for Adults, Female A healthy lifestyle and preventive care can promote health and wellness. Preventive health guidelines for women include the following key practices.  A routine yearly physical is a good way to check with your caregiver about your health and preventive screening. It is a chance to share any concerns and updates on your health, and to receive a thorough exam.  Visit your dentist for a routine exam and preventive care every 6 months. Brush your teeth twice a day and floss once a day. Good oral hygiene prevents tooth decay and gum disease.  The frequency of eye exams is based on your age, health, family medical history, use of contact lenses, and other factors. Follow your caregiver's recommendations for frequency of eye exams.  Eat a healthy diet. Foods like vegetables, fruits, whole grains, low-fat dairy products, and lean protein foods contain the nutrients you need without too many calories. Decrease your intake of foods high in solid fats, added sugars, and salt. Eat the right amount of calories for you.Get information about a proper diet from your caregiver, if necessary.  Regular physical exercise is one of the most important things you can do for your health. Most adults should get at least 150 minutes of moderate-intensity exercise (any activity that increases your heart rate and causes you to sweat) each week. In addition, most adults need muscle-strengthening exercises on 2 or more days a week.  Maintain a healthy weight. The body mass index (BMI) is a screening tool to identify possible weight problems. It provides an estimate of body fat based on height and weight. Your caregiver can help determine your BMI, and can help you achieve or maintain a healthy weight.For adults 20 years and older:  A BMI below 18.5 is considered underweight.  A BMI of 18.5 to 24.9 is normal.  A BMI of 25 to 29.9 is considered overweight.  A BMI of 30 and above is  considered obese.  Maintain normal blood lipids and cholesterol levels by exercising and minimizing your intake of saturated fat. Eat a balanced diet with plenty of fruit and vegetables. Blood tests for lipids and cholesterol should begin at age 20 and be repeated every 5 years. If your lipid or cholesterol levels are high, you are over 50, or you are at high risk for heart disease, you may need your cholesterol levels checked more frequently.Ongoing high lipid and cholesterol levels should be treated with medicines if diet and exercise are not effective.  If you smoke, find out from your caregiver how to quit. If you do not use tobacco, do not start.  If you are pregnant, do not drink alcohol. If you are breastfeeding, be very cautious about drinking alcohol. If you are not pregnant and choose to drink alcohol, do not exceed 1 drink per day. One drink is considered to be 12 ounces (355 mL) of beer, 5 ounces (148 mL) of wine, or 1.5 ounces (44 mL) of liquor.  Avoid use of street drugs. Do not share needles with anyone. Ask for help if you need support or instructions about stopping the use of drugs.  High blood pressure causes heart disease and increases the risk of stroke. Your blood pressure should be checked at least every 1 to 2 years. Ongoing high blood pressure should be treated with medicines if weight loss and exercise are not effective.  If you are 55 to 29 years old, ask your caregiver if you should take aspirin to prevent strokes.  Diabetes   screening involves taking a blood sample to check your fasting blood sugar level. This should be done once every 3 years, after age 45, if you are within normal weight and without risk factors for diabetes. Testing should be considered at a younger age or be carried out more frequently if you are overweight and have at least 1 risk factor for diabetes.  Breast cancer screening is essential preventive care for women. You should practice "breast  self-awareness." This means understanding the normal appearance and feel of your breasts and may include breast self-examination. Any changes detected, no matter how small, should be reported to a caregiver. Women in their 20s and 30s should have a clinical breast exam (CBE) by a caregiver as part of a regular health exam every 1 to 3 years. After age 40, women should have a CBE every year. Starting at age 40, women should consider having a mammography (breast X-ray test) every year. Women who have a family history of breast cancer should talk to their caregiver about genetic screening. Women at a high risk of breast cancer should talk to their caregivers about having magnetic resonance imaging (MRI) and a mammography every year.  The Pap test is a screening test for cervical cancer. A Pap test can show cell changes on the cervix that might become cervical cancer if left untreated. A Pap test is a procedure in which cells are obtained and examined from the lower end of the uterus (cervix).  Women should have a Pap test starting at age 21.  Between ages 21 and 29, Pap tests should be repeated every 2 years.  Beginning at age 30, you should have a Pap test every 3 years as long as the past 3 Pap tests have been normal.  Some women have medical problems that increase the chance of getting cervical cancer. Talk to your caregiver about these problems. It is especially important to talk to your caregiver if a new problem develops soon after your last Pap test. In these cases, your caregiver may recommend more frequent screening and Pap tests.  The above recommendations are the same for women who have or have not gotten the vaccine for human papillomavirus (HPV).  If you had a hysterectomy for a problem that was not cancer or a condition that could lead to cancer, then you no longer need Pap tests. Even if you no longer need a Pap test, a regular exam is a good idea to make sure no other problems are  starting.  If you are between ages 65 and 70, and you have had normal Pap tests going back 10 years, you no longer need Pap tests. Even if you no longer need a Pap test, a regular exam is a good idea to make sure no other problems are starting.  If you have had past treatment for cervical cancer or a condition that could lead to cancer, you need Pap tests and screening for cancer for at least 20 years after your treatment.  If Pap tests have been discontinued, risk factors (such as a new sexual partner) need to be reassessed to determine if screening should be resumed.  The HPV test is an additional test that may be used for cervical cancer screening. The HPV test looks for the virus that can cause the cell changes on the cervix. The cells collected during the Pap test can be tested for HPV. The HPV test could be used to screen women aged 30 years and older, and should   be used in women of any age who have unclear Pap test results. After the age of 30, women should have HPV testing at the same frequency as a Pap test.  Colorectal cancer can be detected and often prevented. Most routine colorectal cancer screening begins at the age of 50 and continues through age 75. However, your caregiver may recommend screening at an earlier age if you have risk factors for colon cancer. On a yearly basis, your caregiver may provide home test kits to check for hidden blood in the stool. Use of a small camera at the end of a tube, to directly examine the colon (sigmoidoscopy or colonoscopy), can detect the earliest forms of colorectal cancer. Talk to your caregiver about this at age 50, when routine screening begins. Direct examination of the colon should be repeated every 5 to 10 years through age 75, unless early forms of pre-cancerous polyps or small growths are found.  Hepatitis C blood testing is recommended for all people born from 1945 through 1965 and any individual with known risks for hepatitis C.  Practice  safe sex. Use condoms and avoid high-risk sexual practices to reduce the spread of sexually transmitted infections (STIs). STIs include gonorrhea, chlamydia, syphilis, trichomonas, herpes, HPV, and human immunodeficiency virus (HIV). Herpes, HIV, and HPV are viral illnesses that have no cure. They can result in disability, cancer, and death. Sexually active women aged 25 and younger should be checked for chlamydia. Older women with new or multiple partners should also be tested for chlamydia. Testing for other STIs is recommended if you are sexually active and at increased risk.  Osteoporosis is a disease in which the bones lose minerals and strength with aging. This can result in serious bone fractures. The risk of osteoporosis can be identified using a bone density scan. Women ages 65 and over and women at risk for fractures or osteoporosis should discuss screening with their caregivers. Ask your caregiver whether you should take a calcium supplement or vitamin D to reduce the rate of osteoporosis.  Menopause can be associated with physical symptoms and risks. Hormone replacement therapy is available to decrease symptoms and risks. You should talk to your caregiver about whether hormone replacement therapy is right for you.  Use sunscreen with sun protection factor (SPF) of 30 or more. Apply sunscreen liberally and repeatedly throughout the day. You should seek shade when your shadow is shorter than you. Protect yourself by wearing long sleeves, pants, a wide-brimmed hat, and sunglasses year round, whenever you are outdoors.  Once a month, do a whole body skin exam, using a mirror to look at the skin on your back. Notify your caregiver of new moles, moles that have irregular borders, moles that are larger than a pencil eraser, or moles that have changed in shape or color.  Stay current with required immunizations.  Influenza. You need a dose every fall (or winter). The composition of the flu vaccine  changes each year, so being vaccinated once is not enough.  Pneumococcal polysaccharide. You need 1 to 2 doses if you smoke cigarettes or if you have certain chronic medical conditions. You need 1 dose at age 65 (or older) if you have never been vaccinated.  Tetanus, diphtheria, pertussis (Tdap, Td). Get 1 dose of Tdap vaccine if you are younger than age 65, are over 65 and have contact with an infant, are a healthcare worker, are pregnant, or simply want to be protected from whooping cough. After that, you need a Td   booster dose every 10 years. Consult your caregiver if you have not had at least 3 tetanus and diphtheria-containing shots sometime in your life or have a deep or dirty wound.  HPV. You need this vaccine if you are a woman age 26 or younger. The vaccine is given in 3 doses over 6 months.  Measles, mumps, rubella (MMR). You need at least 1 dose of MMR if you were born in 1957 or later. You may also need a second dose.  Meningococcal. If you are age 19 to 21 and a first-year college student living in a residence hall, or have one of several medical conditions, you need to get vaccinated against meningococcal disease. You may also need additional booster doses.  Zoster (shingles). If you are age 60 or older, you should get this vaccine.  Varicella (chickenpox). If you have never had chickenpox or you were vaccinated but received only 1 dose, talk to your caregiver to find out if you need this vaccine.  Hepatitis A. You need this vaccine if you have a specific risk factor for hepatitis A virus infection or you simply wish to be protected from this disease. The vaccine is usually given as 2 doses, 6 to 18 months apart.  Hepatitis B. You need this vaccine if you have a specific risk factor for hepatitis B virus infection or you simply wish to be protected from this disease. The vaccine is given in 3 doses, usually over 6 months. Preventive Services / Frequency Ages 19 to 39  Blood  pressure check.** / Every 1 to 2 years.  Lipid and cholesterol check.** / Every 5 years beginning at age 20.  Clinical breast exam.** / Every 3 years for women in their 20s and 30s.  Pap test.** / Every 2 years from ages 21 through 29. Every 3 years starting at age 30 through age 65 or 70 with a history of 3 consecutive normal Pap tests.  HPV screening.** / Every 3 years from ages 30 through ages 65 to 70 with a history of 3 consecutive normal Pap tests.  Hepatitis C blood test.** / For any individual with known risks for hepatitis C.  Skin self-exam. / Monthly.  Influenza immunization.** / Every year.  Pneumococcal polysaccharide immunization.** / 1 to 2 doses if you smoke cigarettes or if you have certain chronic medical conditions.  Tetanus, diphtheria, pertussis (Tdap, Td) immunization. / A one-time dose of Tdap vaccine. After that, you need a Td booster dose every 10 years.  HPV immunization. / 3 doses over 6 months, if you are 26 and younger.  Measles, mumps, rubella (MMR) immunization. / You need at least 1 dose of MMR if you were born in 1957 or later. You may also need a second dose.  Meningococcal immunization. / 1 dose if you are age 19 to 21 and a first-year college student living in a residence hall, or have one of several medical conditions, you need to get vaccinated against meningococcal disease. You may also need additional booster doses.  Varicella immunization.** / Consult your caregiver.  Hepatitis A immunization.** / Consult your caregiver. 2 doses, 6 to 18 months apart.  Hepatitis B immunization.** / Consult your caregiver. 3 doses usually over 6 months. Ages 40 to 64  Blood pressure check.** / Every 1 to 2 years.  Lipid and cholesterol check.** / Every 5 years beginning at age 20.  Clinical breast exam.** / Every year after age 40.  Mammogram.** / Every year beginning at age 40   and continuing for as long as you are in good health. Consult with your  caregiver.  Pap test.** / Every 3 years starting at age 30 through age 65 or 70 with a history of 3 consecutive normal Pap tests.  HPV screening.** / Every 3 years from ages 30 through ages 65 to 70 with a history of 3 consecutive normal Pap tests.  Fecal occult blood test (FOBT) of stool. / Every year beginning at age 50 and continuing until age 75. You may not need to do this test if you get a colonoscopy every 10 years.  Flexible sigmoidoscopy or colonoscopy.** / Every 5 years for a flexible sigmoidoscopy or every 10 years for a colonoscopy beginning at age 50 and continuing until age 75.  Hepatitis C blood test.** / For all people born from 1945 through 1965 and any individual with known risks for hepatitis C.  Skin self-exam. / Monthly.  Influenza immunization.** / Every year.  Pneumococcal polysaccharide immunization.** / 1 to 2 doses if you smoke cigarettes or if you have certain chronic medical conditions.  Tetanus, diphtheria, pertussis (Tdap, Td) immunization.** / A one-time dose of Tdap vaccine. After that, you need a Td booster dose every 10 years.  Measles, mumps, rubella (MMR) immunization. / You need at least 1 dose of MMR if you were born in 1957 or later. You may also need a second dose.  Varicella immunization.** / Consult your caregiver.  Meningococcal immunization.** / Consult your caregiver.  Hepatitis A immunization.** / Consult your caregiver. 2 doses, 6 to 18 months apart.  Hepatitis B immunization.** / Consult your caregiver. 3 doses, usually over 6 months. Ages 65 and over  Blood pressure check.** / Every 1 to 2 years.  Lipid and cholesterol check.** / Every 5 years beginning at age 20.  Clinical breast exam.** / Every year after age 40.  Mammogram.** / Every year beginning at age 40 and continuing for as long as you are in good health. Consult with your caregiver.  Pap test.** / Every 3 years starting at age 30 through age 65 or 70 with a 3  consecutive normal Pap tests. Testing can be stopped between 65 and 70 with 3 consecutive normal Pap tests and no abnormal Pap or HPV tests in the past 10 years.  HPV screening.** / Every 3 years from ages 30 through ages 65 or 70 with a history of 3 consecutive normal Pap tests. Testing can be stopped between 65 and 70 with 3 consecutive normal Pap tests and no abnormal Pap or HPV tests in the past 10 years.  Fecal occult blood test (FOBT) of stool. / Every year beginning at age 50 and continuing until age 75. You may not need to do this test if you get a colonoscopy every 10 years.  Flexible sigmoidoscopy or colonoscopy.** / Every 5 years for a flexible sigmoidoscopy or every 10 years for a colonoscopy beginning at age 50 and continuing until age 75.  Hepatitis C blood test.** / For all people born from 1945 through 1965 and any individual with known risks for hepatitis C.  Osteoporosis screening.** / A one-time screening for women ages 65 and over and women at risk for fractures or osteoporosis.  Skin self-exam. / Monthly.  Influenza immunization.** / Every year.  Pneumococcal polysaccharide immunization.** / 1 dose at age 65 (or older) if you have never been vaccinated.  Tetanus, diphtheria, pertussis (Tdap, Td) immunization. / A one-time dose of Tdap vaccine if you are over   65 and have contact with an infant, are a healthcare worker, or simply want to be protected from whooping cough. After that, you need a Td booster dose every 10 years.  Varicella immunization.** / Consult your caregiver.  Meningococcal immunization.** / Consult your caregiver.  Hepatitis A immunization.** / Consult your caregiver. 2 doses, 6 to 18 months apart.  Hepatitis B immunization.** / Check with your caregiver. 3 doses, usually over 6 months. ** Family history and personal history of risk and conditions may change your caregiver's recommendations. Document Released: 05/03/2001 Document Revised: 05/30/2011  Document Reviewed: 08/02/2010 [employer] Patient Information 2014 [employer].  

## 2012-11-27 NOTE — Progress Notes (Signed)
  Subjective:     Cindy Walker is a 27 y.o. G79P2001 female and is here for a comprehensive physical exam. The patient reports no problems. Desires prescription of OCPs; has used this int he past without any problems.  History   Social History  . Marital Status: Married    Spouse Name: N/A    Number of Children: N/A  . Years of Education: N/A   Occupational History  . Not on file.   Social History Main Topics  . Smoking status: Former Smoker -- 0.50 packs/day for 7 years    Types: Cigarettes    Quit date: 12/19/2008  . Smokeless tobacco: Never Used  . Alcohol Use: Yes     Comment: 1-2 per week  . Drug Use: No  . Sexual Activity: Yes    Birth Control/ Protection: Condom   Other Topics Concern  . Not on file   Social History Narrative  . No narrative on file   Health Maintenance  Topic Date Due  . Pap Smear  09/29/2001  . Tetanus/tdap  09/30/2002  . Influenza Vaccine  10/19/2012    The following portions of the patient's history were reviewed and updated as appropriate: allergies, current medications, past family history, past medical history, past social history, past surgical history and problem list.  Review of Systems A comprehensive review of systems was negative.   Objective:   BP 110/72  Pulse 55  Ht 5\' 3"  (1.6 m)  Wt 156 lb (70.761 kg)  BMI 27.64 kg/m2  LMP 11/16/2012  Breastfeeding? No GENERAL: Well-developed, well-nourished female in no acute distress.  HEENT: Normocephalic, atraumatic. Sclerae anicteric.  NECK: Supple. Normal thyroid.  LUNGS: Clear to auscultation bilaterally.  HEART: Regular rate and rhythm. BREASTS: Symmetric in size. No masses, skin changes, nipple drainage, or lymphadenopathy. ABDOMEN: Soft, nontender, nondistended. No organomegaly. PELVIC: Normal external female genitalia. Vagina is pink and rugated.  Normal discharge. Normal cervix contour. Pap smear obtained. Uterus is normal in size. No adnexal mass or tenderness.   EXTREMITIES: No cyanosis, clubbing, or edema, 2+ distal pulses.   Assessment:    Healthy female exam.  Contraception management   Plan:   Pap done, will follow up results and manage accordingly. OCPs prescribed Routine preventative health maintenance measures emphasized

## 2012-12-04 LAB — PAP LB, RFX HPV ASCU: PAP Smear Comment: 0

## 2013-01-24 ENCOUNTER — Other Ambulatory Visit: Payer: Self-pay

## 2013-01-31 ENCOUNTER — Telehealth: Payer: Self-pay | Admitting: *Deleted

## 2013-01-31 DIAGNOSIS — IMO0001 Reserved for inherently not codable concepts without codable children: Secondary | ICD-10-CM

## 2013-01-31 MED ORDER — NORGESTIMATE-ETH ESTRADIOL 0.25-35 MG-MCG PO TABS: 1.0000 | ORAL_TABLET | Freq: Every day | ORAL | Status: DC

## 2013-01-31 NOTE — Telephone Encounter (Signed)
Patient is having a lot of breakthrough bleeding with orthotricyclen and would like to switch to a monophasic pill.  Called in orthocyclen.

## 2013-03-01 ENCOUNTER — Ambulatory Visit: Payer: Self-pay | Admitting: Internal Medicine

## 2013-05-29 ENCOUNTER — Encounter: Payer: Self-pay | Admitting: Obstetrics & Gynecology

## 2013-05-29 ENCOUNTER — Ambulatory Visit (INDEPENDENT_AMBULATORY_CARE_PROVIDER_SITE_OTHER): Payer: 59 | Admitting: Obstetrics & Gynecology

## 2013-05-29 ENCOUNTER — Telehealth: Payer: Self-pay | Admitting: Obstetrics & Gynecology

## 2013-05-29 VITALS — BP 100/62 | HR 60 | Resp 16 | Ht 63.0 in | Wt 159.2 lb

## 2013-05-29 DIAGNOSIS — Z9889 Other specified postprocedural states: Secondary | ICD-10-CM

## 2013-05-29 DIAGNOSIS — N971 Female infertility of tubal origin: Secondary | ICD-10-CM

## 2013-05-29 DIAGNOSIS — N920 Excessive and frequent menstruation with regular cycle: Secondary | ICD-10-CM

## 2013-05-29 DIAGNOSIS — Z8279 Family history of other congenital malformations, deformations and chromosomal abnormalities: Secondary | ICD-10-CM

## 2013-05-29 DIAGNOSIS — G43909 Migraine, unspecified, not intractable, without status migrainosus: Secondary | ICD-10-CM

## 2013-05-29 MED ORDER — NORETHINDRONE 0.35 MG PO TABS: 1.0000 | ORAL_TABLET | Freq: Every day | ORAL | Status: DC

## 2013-05-29 MED ORDER — FOLIC ACID 1 MG PO TABS: 4.0000 mg | ORAL_TABLET | Freq: Every day | ORAL | Status: DC

## 2013-05-29 NOTE — Progress Notes (Signed)
28 y.o. [id number] MarriedCaucasianF here for new patient visit/consult.  Referred by Dr. Laurelyn Sickle office.  Very nice young lady with hx of heavy and cycles with cramping who has been on OCPs for several years for birth control, when she desires it.  H/O migraines with aura that have worsened lately.  Increased risk of stoke with OCPs discussed.  Will switch to micronor.  Directions for use discussed.  She will switch after this pack as she is almost done with pack.    Pt also here for my opinion regarding last delivery.  H/O cearean section.  Operative note states BTL done.  Pt has had two ultrasounds to assess tubes which showed nothing significant.  Reviewed operative note and pathology from surgery.  Operative noted does not indicate BTL done and there is not tubal pathology from same day.  Doubtful of this.  Pt wants to be sure and has some distrust of prior ob/gyn.  SHGM vs HSG discussed.  Due to other cycle issues, feel SHGM is better test.  Pt's first son born with cleft lip and palate.  Repair at age [age] had complications and child died.  Pt is contemplating another pregnancy if tubes are patent.  Rx for 4mg  folic acid given.  She took something like this with last pregnancy--stated was 9 pills a day but unsure of dosage.  Advised to start one month before she is going to try to conceive.  Patient's last menstrual period was 05/21/2013.          Sexually active: yes  The current method of family planning is OCP (estrogen/progesterone).    Exercising: yes  cardio Smoker:  former  Health Maintenance: Pap:  11/27/12 WNL History of abnormal Pap:  no MMG:  2006 and [place] for cyst-none since removed Colonoscopy:  2006 for anemia, ? Internal bleeding BMD:   none TDaP:  ?2004 Screening Labs: PCP, Hb today: PCP, Urine today: PCP   reports that she quit smoking about 4 years ago. Her smoking use included Cigarettes. She has a 3.5 pack-year smoking history. She has never used smokeless tobacco. She reports  that she drinks about 1.0 ounces of alcohol per week. She reports that she does not use illicit drugs.  Past Medical History  Diagnosis Date  . Migraine     with aura  . Asthma   . Fibrocystic breast   . Breast disorder     cyst on Lt breast-removed 5/14  . Anxiety   . Anemia     Past Surgical History  Procedure Laterality Date  . Cesarean section with bilateral tubal ligation  04/16/2012    C/S only - no BTL done  . Cesarean section  01/11/11    No BTL done  . Breast surgery      cyst removed 5/14  . Wisdom tooth extraction      Current Outpatient Prescriptions  Medication Sig Dispense Refill  . albuterol (PROVENTIL HFA;VENTOLIN HFA) 108 (90 BASE) MCG/ACT inhaler Inhale 2 puffs into the lungs every 6 (six) hours as needed.      Marland Kitchen ibuprofen (ADVIL,MOTRIN) 600 MG tablet Take 1 tablet (600 mg total) by mouth every 6 (six) hours.  60 tablet  0  . ketorolac (TORADOL) 10 MG tablet as needed.      . norgestimate-ethinyl estradiol (ORTHO-CYCLEN,SPRINTEC,PREVIFEM) 0.25-35 MG-MCG tablet Take 1 tablet by mouth daily.  1 Package  11  . ALPRAZolam (XANAX) 0.25 MG tablet as needed.       No current facility-administered  medications for this visit.    Family History  Problem Relation Age of Onset  . Diabetes Maternal Aunt     Type 2  . Heart disease Maternal Grandmother   . Cancer Maternal Grandfather     liver cancer  . Heart disease Maternal Grandfather   . Heart disease Paternal Grandmother   . Heart disease Paternal Grandfather   . Cleft palate Son     deceased    ROS:  Pertinent items are noted in HPI.  Otherwise, a comprehensive ROS was negative.  Exam:   BP 100/62  Pulse 60  Resp 16  Ht 5\' 3"  (1.6 m)  Wt 159 lb 3.2 oz (72.213 kg)  BMI 28.21 kg/m2  LMP 05/21/2013  Breastfeeding? No    Height: 5\' 3"  (160 cm)  Ht Readings from Last 3 Encounters:  05/29/13 5\' 3"  (1.6 m)  11/27/12 5\' 3"  (1.6 m)  08/10/12 5\' 3"  (1.6 m)    General appearance: alert, cooperative and  appears stated age No exam indicated.  A:  H/O menorrhagia and dysmenorrhea H/O migraines with aura H/O child with clef lip and palate (deceased) Questions regarding cesarean section and tubal ligation  P:   Plan SHGM with next cycle.  Should be around the end of the month.  Will check insurance for pt. Pt to change to micronor after this pack of OCPs is complete.  Rx to pharmacy.  IUD discussed for long term birth control after next pregnancy. Folic acid 4mg  daily, starting one month before she starts to conceive. Will try to see if can get medical hx changed in EPIC.  ~40 minutes spent with patient >50% of time was in face to face discussion of above.    An After Visit Summary was printed and given to the patient.

## 2013-05-29 NOTE — Telephone Encounter (Signed)
Spoke with patient. Advised that per benefits quote received from her health plan, she will only be liable for a $25 co-pay when she comes in for scheduled SHGM. Patient agreeable.

## 2013-05-29 NOTE — Patient Instructions (Signed)
We will call with the information about your ultrasound.

## 2013-05-29 NOTE — Progress Notes (Signed)
PUS/SHGM scheduled for 06-20-13 at 330.  She is aware of instructions to take Motrin 800 mg 1 hour prior with food.  Ins dept will call if OOP cost estimate > co-pay.  Aware of cancel policy.  Aware to call if cycle does not start on time.

## 2013-06-03 ENCOUNTER — Encounter: Payer: Self-pay | Admitting: Obstetrics & Gynecology

## 2013-06-05 ENCOUNTER — Encounter (HOSPITAL_COMMUNITY): Payer: Self-pay | Admitting: Obstetrics & Gynecology

## 2013-06-05 ENCOUNTER — Telehealth: Payer: Self-pay | Admitting: Obstetrics & Gynecology

## 2013-06-05 NOTE — Telephone Encounter (Signed)
Pt is scheduled for an ultrasound on 06/20/13 and she is bleeding now she is concerned and would like to talk with the nurse.

## 2013-06-05 NOTE — Telephone Encounter (Signed)
Spoke with patient. Message from Dr. Sabra Heck given,. Patient verbalized understating and agreeable to plan.  [facility] appointment changed to 06/13/13 at 1130, patient agreeable to time date change. Will return call if bleeding worsens.   Routing to provider for final review. Patient agreeable to disposition. Will close encounter

## 2013-06-05 NOTE — Telephone Encounter (Signed)
Dr. Sabra Heck, if she has a cycle now will that require that she change her sonohysterogram appointment?

## 2013-06-05 NOTE — Telephone Encounter (Signed)
I would move it up a week (next Thursday).  So, if she does take the placebo pills now, just change her SHGM to next week.  Thanks.

## 2013-06-05 NOTE — Telephone Encounter (Signed)
Don't think I routed the last msg to you.

## 2013-06-05 NOTE — Telephone Encounter (Signed)
She can just stop her pills for four days and have a "real" period.  Then restart a new pack.  That should get her back on schedule.  That way, she won't have to keep having bleeding until she gets to her placebo pills.  If pt is agreeable, can close encounter.

## 2013-06-05 NOTE — Telephone Encounter (Signed)
Spoke with patient. Patient has concerns about bleeding early during active pills in pack. Currently still on Sprintec. Has not missed any pills. Last menstrual period 3/3. Patient reports "light spotting" started on 3/14 after intercourse then resolved. Then occurred Monday after intercourse but is now more "period like" per patient. No painful intercourse. Patient wearing pad not having to change often. Scheduled for Sonohysterogram on 4/2.   Dr. Sabra Heck, would you like patient to continue to monitor bleeding and keep appointment for 4/2?

## 2013-06-11 ENCOUNTER — Encounter (HOSPITAL_COMMUNITY): Payer: Self-pay | Admitting: Obstetrics & Gynecology

## 2013-06-13 ENCOUNTER — Ambulatory Visit (INDEPENDENT_AMBULATORY_CARE_PROVIDER_SITE_OTHER): Payer: 59 | Admitting: Obstetrics & Gynecology

## 2013-06-13 ENCOUNTER — Encounter: Payer: Self-pay | Admitting: Obstetrics & Gynecology

## 2013-06-13 ENCOUNTER — Ambulatory Visit (INDEPENDENT_AMBULATORY_CARE_PROVIDER_SITE_OTHER): Payer: 59

## 2013-06-13 VITALS — BP 110/76 | HR 60 | Ht 63.0 in | Wt 156.0 lb

## 2013-06-13 DIAGNOSIS — Z9889 Other specified postprocedural states: Secondary | ICD-10-CM

## 2013-06-13 DIAGNOSIS — N971 Female infertility of tubal origin: Secondary | ICD-10-CM

## 2013-06-13 DIAGNOSIS — N949 Unspecified condition associated with female genital organs and menstrual cycle: Secondary | ICD-10-CM

## 2013-06-13 DIAGNOSIS — N938 Other specified abnormal uterine and vaginal bleeding: Secondary | ICD-10-CM

## 2013-06-13 NOTE — Patient Instructions (Signed)
Please call with any new problems/issues.

## 2013-06-13 NOTE — Progress Notes (Signed)
28 y.o.Marriedfemale here for a pelvic ultrasound with sonohystogram due to irregular bleeding on OCPs (which she has now stopped) and to assess tubal patency.  With last C-section, surgery docuemnted in electronic records at with [place].  Pt did not consent for this.  Operative note does not seem to indicate this was done.  However, still part of medical record.  Pt and spouse going to try for another child as first child died after routine cleft lip/palate surgery.  Indication: DUB, tubal patency  Contraception: condoms.  Has had intercourse once since LMP of 06/03/13.  Technique:  Both transabdominal and transvaginal ultrasound  examinations of the pelvis were performed. Transabdominal technique was performed for global imaging of the pelvis including uterus, ovaries, adnexal regions, and pelvic cul-de-sac.  It was necessary to proceed with endovaginal exam following the abdominal ultrasound transabdominal exam to visualize the endometrium and adnexa. Color and duplex Doppler ultrasound was utilized to evaluate blood flow to the ovaries.    FINDINGS: UTERUS: 6.6 x 5.8 x 3.5cm.  No fibroids.  C-section scar noted. EMS: 2.57mm ADNEXA: left ovary 3.1 x 1.7 x 1.6cm Right ovary 4.0 x 1.7 x 1.5cm, no masses on either ovary CUL DE SAC:  Mild free fluid in posterior cul de sac  SHSG:  After obtaining appropriate verbal consent from patient, the cervix was visualized using a speculum, and prepped with betadine.  A tenaculum  was not applied to the cervix.  Dilation of the cervix was not necessary. The catheter with bulb was passed into the uterus and inflated with 1.5cc air.  Bulb pulled back against the cervix.  Then sterile saline introduced, with the following findings noted:  Normal cavity with bilateral tubal spill.  Assessment:  DUB, tubal patency  Plan:  Pt will stay off OCPs for now.  Will just use condoms.  Is planning on starting to try again in the summer.  On PNVs.  D/w DUB that is common with  generic OCPs.  Pt knows to call and let me know if has any future abnormal bleeding off OCPs or with +UPT.  All questions answered.  ~25 minutes spent with patient >50% of time was in face to face discussion of above.

## 2013-06-20 ENCOUNTER — Other Ambulatory Visit: Payer: 59 | Admitting: Obstetrics & Gynecology

## 2013-06-20 ENCOUNTER — Other Ambulatory Visit: Payer: 59

## 2013-06-25 ENCOUNTER — Encounter (HOSPITAL_COMMUNITY): Payer: Self-pay | Admitting: Obstetrics & Gynecology

## 2013-06-26 ENCOUNTER — Encounter (HOSPITAL_COMMUNITY): Payer: Self-pay | Admitting: Obstetrics & Gynecology

## 2014-01-20 ENCOUNTER — Encounter (HOSPITAL_COMMUNITY): Payer: Self-pay | Admitting: Obstetrics & Gynecology

## 2014-02-26 ENCOUNTER — Encounter: Payer: Self-pay | Admitting: Physician Assistant

## 2014-02-26 ENCOUNTER — Ambulatory Visit (INDEPENDENT_AMBULATORY_CARE_PROVIDER_SITE_OTHER): Payer: 59 | Admitting: Physician Assistant

## 2014-02-26 VITALS — BP 126/75 | HR 92 | Wt 193.0 lb

## 2014-02-26 DIAGNOSIS — Z3491 Encounter for supervision of normal pregnancy, unspecified, first trimester: Secondary | ICD-10-CM

## 2014-02-26 DIAGNOSIS — Z124 Encounter for screening for malignant neoplasm of cervix: Secondary | ICD-10-CM

## 2014-02-26 DIAGNOSIS — Z3481 Encounter for supervision of other normal pregnancy, first trimester: Secondary | ICD-10-CM

## 2014-02-26 DIAGNOSIS — Z1151 Encounter for screening for human papillomavirus (HPV): Secondary | ICD-10-CM

## 2014-02-26 DIAGNOSIS — Z113 Encounter for screening for infections with a predominantly sexual mode of transmission: Secondary | ICD-10-CM

## 2014-02-26 DIAGNOSIS — O0993 Supervision of high risk pregnancy, unspecified, third trimester: Secondary | ICD-10-CM | POA: Insufficient documentation

## 2014-02-26 DIAGNOSIS — Z23 Encounter for immunization: Secondary | ICD-10-CM

## 2014-02-26 DIAGNOSIS — Z3490 Encounter for supervision of normal pregnancy, unspecified, unspecified trimester: Secondary | ICD-10-CM | POA: Insufficient documentation

## 2014-02-26 MED ORDER — ALBUTEROL SULFATE HFA 108 (90 BASE) MCG/ACT IN AERS: 2.0000 | INHALATION_SPRAY | Freq: Four times a day (QID) | RESPIRATORY_TRACT | Status: DC | PRN

## 2014-02-26 NOTE — Patient Instructions (Signed)
First Trimester of Pregnancy The first trimester of pregnancy is from week 1 until the end of week 12 (months 1 through 3). A week after a sperm fertilizes an egg, the egg will implant on the wall of the uterus. This embryo will begin to develop into a baby. Genes from you and your partner are forming the baby. The female genes determine whether the baby is a boy or a girl. At 6-8 weeks, the eyes and face are formed, and the heartbeat can be seen on ultrasound. At the end of 12 weeks, all the baby's organs are formed.  Now that you are pregnant, you will want to do everything you can to have a healthy baby. Two of the most important things are to get good prenatal care and to follow your health care provider's instructions. Prenatal care is all the medical care you receive before the baby's birth. This care will help prevent, find, and treat any problems during the pregnancy and childbirth. BODY CHANGES Your body goes through many changes during pregnancy. The changes vary from woman to woman.   You may gain or lose a couple of pounds at first.  You may feel sick to your stomach (nauseous) and throw up (vomit). If the vomiting is uncontrollable, call your health care provider.  You may tire easily.  You may develop headaches that can be relieved by medicines approved by your health care provider.  You may urinate more often. Painful urination may mean you have a bladder infection.  You may develop heartburn as a result of your pregnancy.  You may develop constipation because certain hormones are causing the muscles that push waste through your intestines to slow down.  You may develop hemorrhoids or swollen, bulging veins (varicose veins).  Your breasts may begin to grow larger and become tender. Your nipples may stick out more, and the tissue that surrounds them (areola) may become darker.  Your gums may bleed and may be sensitive to brushing and flossing.  Dark spots or blotches (chloasma,  mask of pregnancy) may develop on your face. This will likely fade after the baby is born.  Your menstrual periods will stop.  You may have a loss of appetite.  You may develop cravings for certain kinds of food.  You may have changes in your emotions from day to day, such as being excited to be pregnant or being concerned that something may go wrong with the pregnancy and baby.  You may have more vivid and strange dreams.  You may have changes in your hair. These can include thickening of your hair, rapid growth, and changes in texture. Some women also have hair loss during or after pregnancy, or hair that feels dry or thin. Your hair will most likely return to normal after your baby is born. WHAT TO EXPECT AT YOUR PRENATAL VISITS During a routine prenatal visit:  You will be weighed to make sure you and the baby are growing normally.  Your blood pressure will be taken.  Your abdomen will be measured to track your baby's growth.  The fetal heartbeat will be listened to starting around week 10 or 12 of your pregnancy.  Test results from any previous visits will be discussed. Your health care provider may ask you:  How you are feeling.  If you are feeling the baby move.  If you have had any abnormal symptoms, such as leaking fluid, bleeding, severe headaches, or abdominal cramping.  If you have any questions. Other tests   that may be performed during your first trimester include:  Blood tests to find your blood type and to check for the presence of any previous infections. They will also be used to check for low iron levels (anemia) and Rh antibodies. Later in the pregnancy, blood tests for diabetes will be done along with other tests if problems develop.  Urine tests to check for infections, diabetes, or protein in the urine.  An ultrasound to confirm the proper growth and development of the baby.  An amniocentesis to check for possible genetic problems.  Fetal screens for  spina bifida and Down syndrome.  You may need other tests to make sure you and the baby are doing well. HOME CARE INSTRUCTIONS  Medicines  Follow your health care provider's instructions regarding medicine use. Specific medicines may be either safe or unsafe to take during pregnancy.  Take your prenatal vitamins as directed.  If you develop constipation, try taking a stool softener if your health care provider approves. Diet  Eat regular, well-balanced meals. Choose a variety of foods, such as meat or vegetable-based protein, fish, milk and low-fat dairy products, vegetables, fruits, and whole grain breads and cereals. Your health care provider will help you determine the amount of weight gain that is right for you.  Avoid raw meat and uncooked cheese. These carry germs that can cause birth defects in the baby.  Eating four or five small meals rather than three large meals a day may help relieve nausea and vomiting. If you start to feel nauseous, eating a few soda crackers can be helpful. Drinking liquids between meals instead of during meals also seems to help nausea and vomiting.  If you develop constipation, eat more high-fiber foods, such as fresh vegetables or fruit and whole grains. Drink enough fluids to keep your urine clear or pale yellow. Activity and Exercise  Exercise only as directed by your health care provider. Exercising will help you:  Control your weight.  Stay in shape.  Be prepared for labor and delivery.  Experiencing pain or cramping in the lower abdomen or low back is a good sign that you should stop exercising. Check with your health care provider before continuing normal exercises.  Try to avoid standing for long periods of time. Move your legs often if you must stand in one place for a long time.  Avoid heavy lifting.  Wear low-heeled shoes, and practice good posture.  You may continue to have sex unless your health care provider directs you  otherwise. Relief of Pain or Discomfort  Wear a good support bra for breast tenderness.   Take warm sitz baths to soothe any pain or discomfort caused by hemorrhoids. Use hemorrhoid cream if your health care provider approves.   Rest with your legs elevated if you have leg cramps or low back pain.  If you develop varicose veins in your legs, wear support hose. Elevate your feet for 15 minutes, 3-4 times a day. Limit salt in your diet. Prenatal Care  Schedule your prenatal visits by the twelfth week of pregnancy. They are usually scheduled monthly at first, then more often in the last 2 months before delivery.  Write down your questions. Take them to your prenatal visits.  Keep all your prenatal visits as directed by your health care provider. Safety  Wear your seat belt at all times when driving.  Make a list of emergency phone numbers, including numbers for family, friends, the hospital, and police and fire departments. General Tips    Ask your health care provider for a referral to a local prenatal education class. Begin classes no later than at the beginning of month 6 of your pregnancy.  Ask for help if you have counseling or nutritional needs during pregnancy. Your health care provider can offer advice or refer you to specialists for help with various needs.  Do not use hot tubs, steam rooms, or saunas.  Do not douche or use tampons or scented sanitary pads.  Do not cross your legs for long periods of time.  Avoid cat litter boxes and soil used by cats. These carry germs that can cause birth defects in the baby and possibly loss of the fetus by miscarriage or stillbirth.  Avoid all smoking, herbs, alcohol, and medicines not prescribed by your health care provider. Chemicals in these affect the formation and growth of the baby.  Schedule a dentist appointment. At home, brush your teeth with a soft toothbrush and be gentle when you floss. SEEK MEDICAL CARE IF:   You have  dizziness.  You have mild pelvic cramps, pelvic pressure, or nagging pain in the abdominal area.  You have persistent nausea, vomiting, or diarrhea.  You have a bad smelling vaginal discharge.  You have pain with urination.  You notice increased swelling in your face, hands, legs, or ankles. SEEK IMMEDIATE MEDICAL CARE IF:   You have a fever.  You are leaking fluid from your vagina.  You have spotting or bleeding from your vagina.  You have severe abdominal cramping or pain.  You have rapid weight gain or loss.  You vomit blood or material that looks like coffee grounds.  You are exposed to [place] measles and have never had them.  You are exposed to fifth disease or chickenpox.  You develop a severe headache.  You have shortness of breath.  You have any kind of trauma, such as from a fall or a car accident. Document Released: 03/01/2001 Document Revised: 07/22/2013 Document Reviewed: 01/15/2013 [facility] Patient Information 2015 [place], [place]. This information is not intended to replace advice given to you by your health care provider. Make sure you discuss any questions you have with your health care provider. Nausea medication to take during pregnancy:   Unisom (doxylamine succinate 25 mg tablets) Take one tablet daily at bedtime. If symptoms are not adequately controlled, the dose can be increased to a maximum recommended dose of two tablets daily (1/2 tablet in the morning, 1/2 tablet mid-afternoon and one at bedtime).  Vitamin B6 100mg  tablets. Take one tablet twice a day (up to 200 mg per day).

## 2014-02-26 NOTE — Progress Notes (Signed)
Bedside ultrasound shows gestational sac measuring 6 weeks with yolk sac but no fetal pole identified.

## 2014-02-26 NOTE — Progress Notes (Signed)
Subjective:    Cindy Walker is a G3P2001 [redacted]w[redacted]d being seen today for her first obstetrical visit.   Patient does intend to breast feed. Pregnancy history fully reviewed.  Patient reports nausea and headache.  Filed Vitals:   02/26/14 1325  BP: 126/75  Pulse: 92  Weight: 193 lb (87.544 kg)    HISTORY: OB History  Gravida Para Term Preterm AB SAB TAB Ectopic Multiple Living  3 2 2       1     # Outcome Date GA Lbr Len/2nd Weight Sex Delivery Anes PTL Lv  3 Current           2 Term 04/16/12 [redacted]w[redacted]d   F CS-LTranv Spinal  Y  1 Term 12/2010   7 lb (3.175 kg) M CS-LTranv  N N     Comments: Cleft Lip and palate/died due to surgical complications     Obstetric Comments  Son passed in 2/13-delivered c-section   Past Medical History  Diagnosis Date  . Migraine     with aura  . Asthma   . Fibrocystic breast   . Breast disorder     cyst on Lt breast-removed 5/14  . Anxiety   . Anemia    Past Surgical History  Procedure Laterality Date  . Cesarean section  01/11/11    No BTL done  . Breast surgery      cyst removed 5/14  . Wisdom tooth extraction    . Cesarean section N/A 04/16/2012    Procedure: Cesarean Section;  Surgeon: Guss Bunde, MD;  Location: [facility];  Service: Obstetrics;  Laterality: N/A;  Wound Class (Clean contaminated)   Family History  Problem Relation Age of Onset  . Diabetes Maternal Aunt     Type 2  . Heart disease Maternal Grandmother   . Cancer Maternal Grandfather     liver cancer  . Heart disease Maternal Grandfather   . Heart disease Paternal Grandmother   . Heart disease Paternal Grandfather   . Cleft palate Son     deceased     Exam    Uterus:   early gravid  Pelvic Exam:    Perineum: Normal Perineum   Vulva: normal   Vagina:  normal mucosa   pH:    Cervix: no cervical motion tenderness   Adnexa: normal adnexa and no mass, fullness, tenderness   Bony Pelvis: average  System: Breast:  normal appearance, no masses or  tenderness   Skin: normal coloration and turgor, no rashes    Neurologic: oriented, negative   Extremities: normal strength, tone, and muscle mass   HEENT extra ocular movement intact   Mouth/Teeth mucous membranes moist, pharynx normal without lesions and dental hygiene good   Neck supple   Cardiovascular: regular rate and rhythm, no murmurs or gallops   Respiratory:  appears well, vitals normal, no respiratory distress, acyanotic, normal RR, ear and throat exam is normal, neck free of mass or lymphadenopathy, chest clear, no wheezing, crepitations, rhonchi, normal symmetric air entry   Abdomen: soft, non-tender; bowel sounds normal; no masses,  no organomegaly   Urinary: urethral meatus normal      Assessment:    Pregnancy: G3P2001 Patient Active Problem List   Diagnosis Date Noted  . Left breast mass 06/04/2012  . Breast cyst 05/30/2012  . Family history of cleft palate with cleft lip 03/22/2012  . Asthma 02/24/2012  . Migraine 02/24/2012        Plan:  Initial labs drawn. Prenatal vitamins. Problem list reviewed and updated. Genetic Screening discussed First Screen and Integrated Screen: requested.  Ultrasound discussed; fetal survey: requested.  Follow up in 1-2 weeks. Will do u/s to confirm viability and first screen at that time 50% of 70min visit spent on counseling and coordination of care.  Unisom and Vitamin B6 to help with nausea Tylenol PRN HA.  If no improvement, can try Flexeril for HA.   Albuterol refilled for PRN use.   Paticia Stack 02/26/2014

## 2014-02-27 LAB — PRENATAL PROFILE (SOLSTAS)
ANTIBODY SCREEN: NEGATIVE
Basophils Absolute: 0.1 10*3/uL (ref 0.0–0.1)
Basophils Relative: 1 % (ref 0–1)
EOS ABS: 0.4 10*3/uL (ref 0.0–0.7)
EOS PCT: 4 % (ref 0–5)
HEMATOCRIT: 36.6 % (ref 36.0–46.0)
HEMOGLOBIN: 12.7 g/dL (ref 12.0–15.0)
HIV 1&2 Ab, 4th Generation: NONREACTIVE
Hepatitis B Surface Ag: NEGATIVE
LYMPHS ABS: 2.7 10*3/uL (ref 0.7–4.0)
Lymphocytes Relative: 28 % (ref 12–46)
MCH: 31.1 pg (ref 26.0–34.0)
MCHC: 34.7 g/dL (ref 30.0–36.0)
MCV: 89.7 fL (ref 78.0–100.0)
MONOS PCT: 8 % (ref 3–12)
MPV: 9.4 fL (ref 9.4–12.4)
Monocytes Absolute: 0.8 10*3/uL (ref 0.1–1.0)
Neutro Abs: 5.6 10*3/uL (ref 1.7–7.7)
Neutrophils Relative %: 59 % (ref 43–77)
Platelets: 351 10*3/uL (ref 150–400)
RBC: 4.08 MIL/uL (ref 3.87–5.11)
RDW: 13.8 % (ref 11.5–15.5)
RH TYPE: POSITIVE
RUBELLA: 2.55 {index} — AB (ref <0.90)
WBC: 9.5 10*3/uL (ref 4.0–10.5)

## 2014-02-27 LAB — CYTOLOGY - PAP

## 2014-03-11 ENCOUNTER — Ambulatory Visit (INDEPENDENT_AMBULATORY_CARE_PROVIDER_SITE_OTHER): Payer: 59 | Admitting: Family Medicine

## 2014-03-11 VITALS — BP 121/81 | HR 89 | Wt 193.0 lb

## 2014-03-11 DIAGNOSIS — O3421 Maternal care for scar from previous cesarean delivery: Secondary | ICD-10-CM

## 2014-03-11 DIAGNOSIS — Z3491 Encounter for supervision of normal pregnancy, unspecified, first trimester: Secondary | ICD-10-CM

## 2014-03-11 DIAGNOSIS — O34219 Maternal care for unspecified type scar from previous cesarean delivery: Secondary | ICD-10-CM | POA: Insufficient documentation

## 2014-03-11 DIAGNOSIS — J4541 Moderate persistent asthma with (acute) exacerbation: Secondary | ICD-10-CM

## 2014-03-11 MED ORDER — DOXYLAMINE-PYRIDOXINE 10-10 MG PO TBEC: 1.0000 | DELAYED_RELEASE_TABLET | Freq: Two times a day (BID) | ORAL | Status: DC

## 2014-03-11 MED ORDER — FLUTICASONE PROPIONATE HFA 110 MCG/ACT IN AERO: 2.0000 | INHALATION_SPRAY | Freq: Two times a day (BID) | RESPIRATORY_TRACT | Status: DC

## 2014-03-11 NOTE — Progress Notes (Signed)
Using Albuterol q 4 hour--add Flovent Also with nausea--add diclegis Order first screen

## 2014-03-11 NOTE — Patient Instructions (Addendum)
First Trimester of Pregnancy The first trimester of pregnancy is from week 1 until the end of week 12 (months 1 through 3). A week after a sperm fertilizes an egg, the egg will implant on the wall of the uterus. This embryo will begin to develop into a baby. Genes from you and your partner are forming the baby. The female genes determine whether the baby is a boy or a girl. At 6-8 weeks, the eyes and face are formed, and the heartbeat can be seen on ultrasound. At the end of 12 weeks, all the baby's organs are formed.  Now that you are pregnant, you will want to do everything you can to have a healthy baby. Two of the most important things are to get good prenatal care and to follow your health care provider's instructions. Prenatal care is all the medical care you receive before the baby's birth. This care will help prevent, find, and treat any problems during the pregnancy and childbirth. BODY CHANGES Your body goes through many changes during pregnancy. The changes vary from woman to woman.   You may gain or lose a couple of pounds at first.  You may feel sick to your stomach (nauseous) and throw up (vomit). If the vomiting is uncontrollable, call your health care provider.  You may tire easily.  You may develop headaches that can be relieved by medicines approved by your health care provider.  You may urinate more often. Painful urination may mean you have a bladder infection.  You may develop heartburn as a result of your pregnancy.  You may develop constipation because certain hormones are causing the muscles that push waste through your intestines to slow down.  You may develop hemorrhoids or swollen, bulging veins (varicose veins).  Your breasts may begin to grow larger and become tender. Your nipples may stick out more, and the tissue that surrounds them (areola) may become darker.  Your gums may bleed and may be sensitive to brushing and flossing.  Dark spots or blotches  (chloasma, mask of pregnancy) may develop on your face. This will likely fade after the baby is born.  Your menstrual periods will stop.  You may have a loss of appetite.  You may develop cravings for certain kinds of food.  You may have changes in your emotions from day to day, such as being excited to be pregnant or being concerned that something may go wrong with the pregnancy and baby.  You may have more vivid and strange dreams.  You may have changes in your hair. These can include thickening of your hair, rapid growth, and changes in texture. Some women also have hair loss during or after pregnancy, or hair that feels dry or thin. Your hair will most likely return to normal after your baby is born. WHAT TO EXPECT AT YOUR PRENATAL VISITS During a routine prenatal visit:  You will be weighed to make sure you and the baby are growing normally.  Your blood pressure will be taken.  Your abdomen will be measured to track your baby's growth.  The fetal heartbeat will be listened to starting around week 10 or 12 of your pregnancy.  Test results from any previous visits will be discussed. Your health care provider may ask you:  How you are feeling.  If you are feeling the baby move.  If you have had any abnormal symptoms, such as leaking fluid, bleeding, severe headaches, or abdominal cramping.  If you have any questions. Other tests  that may be performed during your first trimester include:  Blood tests to find your blood type and to check for the presence of any previous infections. They will also be used to check for low iron levels (anemia) and Rh antibodies. Later in the pregnancy, blood tests for diabetes will be done along with other tests if problems develop.  Urine tests to check for infections, diabetes, or protein in the urine.  An ultrasound to confirm the proper growth and development of the baby.  An amniocentesis to check for possible genetic problems.  Fetal  screens for spina bifida and Down syndrome.  You may need other tests to make sure you and the baby are doing well. HOME CARE INSTRUCTIONS  Medicines  Follow your health care provider's instructions regarding medicine use. Specific medicines may be either safe or unsafe to take during pregnancy.  Take your prenatal vitamins as directed.  If you develop constipation, try taking a stool softener if your health care provider approves. Diet  Eat regular, well-balanced meals. Choose a variety of foods, such as meat or vegetable-based protein, fish, milk and low-fat dairy products, vegetables, fruits, and whole grain breads and cereals. Your health care provider will help you determine the amount of weight gain that is right for you.  Avoid raw meat and uncooked cheese. These carry germs that can cause birth defects in the baby.  Eating four or five small meals rather than three large meals a day may help relieve nausea and vomiting. If you start to feel nauseous, eating a few soda crackers can be helpful. Drinking liquids between meals instead of during meals also seems to help nausea and vomiting.  If you develop constipation, eat more high-fiber foods, such as fresh vegetables or fruit and whole grains. Drink enough fluids to keep your urine clear or pale yellow. Activity and Exercise  Exercise only as directed by your health care provider. Exercising will help you:  Control your weight.  Stay in shape.  Be prepared for labor and delivery.  Experiencing pain or cramping in the lower abdomen or low back is a good sign that you should stop exercising. Check with your health care provider before continuing normal exercises.  Try to avoid standing for long periods of time. Move your legs often if you must stand in one place for a long time.  Avoid heavy lifting.  Wear low-heeled shoes, and practice good posture.  You may continue to have sex unless your health care provider directs you  otherwise. Relief of Pain or Discomfort  Wear a good support bra for breast tenderness.   Take warm sitz baths to soothe any pain or discomfort caused by hemorrhoids. Use hemorrhoid cream if your health care provider approves.   Rest with your legs elevated if you have leg cramps or low back pain.  If you develop varicose veins in your legs, wear support hose. Elevate your feet for 15 minutes, 3-4 times a day. Limit salt in your diet. Prenatal Care  Schedule your prenatal visits by the twelfth week of pregnancy. They are usually scheduled monthly at first, then more often in the last 2 months before delivery.  Write down your questions. Take them to your prenatal visits.  Keep all your prenatal visits as directed by your health care provider. Safety  Wear your seat belt at all times when driving.  Make a list of emergency phone numbers, including numbers for family, friends, the hospital, and police and fire departments. General Tips  Ask your health care provider for a referral to a local prenatal education class. Begin classes no later than at the beginning of month 6 of your pregnancy.  Ask for help if you have counseling or nutritional needs during pregnancy. Your health care provider can offer advice or refer you to specialists for help with various needs.  Do not use hot tubs, steam rooms, or saunas.  Do not douche or use tampons or scented sanitary pads.  Do not cross your legs for long periods of time.  Avoid cat litter boxes and soil used by cats. These carry germs that can cause birth defects in the baby and possibly loss of the fetus by miscarriage or stillbirth.  Avoid all smoking, herbs, alcohol, and medicines not prescribed by your health care provider. Chemicals in these affect the formation and growth of the baby.  Schedule a dentist appointment. At home, brush your teeth with a soft toothbrush and be gentle when you floss. SEEK MEDICAL CARE IF:   You have  dizziness.  You have mild pelvic cramps, pelvic pressure, or nagging pain in the abdominal area.  You have persistent nausea, vomiting, or diarrhea.  You have a bad smelling vaginal discharge.  You have pain with urination.  You notice increased swelling in your face, hands, legs, or ankles. SEEK IMMEDIATE MEDICAL CARE IF:   You have a fever.  You are leaking fluid from your vagina.  You have spotting or bleeding from your vagina.  You have severe abdominal cramping or pain.  You have rapid weight gain or loss.  You vomit blood or material that looks like coffee grounds.  You are exposed to [place] measles and have never had them.  You are exposed to fifth disease or chickenpox.  You develop a severe headache.  You have shortness of breath.  You have any kind of trauma, such as from a fall or a car accident. Document Released: 03/01/2001 Document Revised: 07/22/2013 Document Reviewed: 01/15/2013 [facility] Patient Information 2015 [place], [place]. This information is not intended to replace advice given to you by your health care provider. Make sure you discuss any questions you have with your health care provider.  Breastfeeding Deciding to breastfeed is one of the [place] choices you can make for you and your baby. A change in hormones during pregnancy causes your breast tissue to grow and increases the number and size of your milk ducts. These hormones also allow proteins, sugars, and fats from your blood supply to make breast milk in your milk-producing glands. Hormones prevent breast milk from being released before your baby is born as well as prompt milk flow after birth. Once breastfeeding has begun, thoughts of your baby, as well as his or her sucking or crying, can stimulate the release of milk from your milk-producing glands.  BENEFITS OF BREASTFEEDING For Your Baby  Your first milk (colostrum) helps your baby's digestive system function better.   There are antibodies  in your milk that help your baby fight off infections.   Your baby has a lower incidence of asthma, allergies, and sudden infant death syndrome.   The nutrients in breast milk are better for your baby than infant formulas and are designed uniquely for your baby's needs.   Breast milk improves your baby's brain development.   Your baby is less likely to develop other conditions, such as childhood obesity, asthma, or type 2 diabetes mellitus.  For You   Breastfeeding helps to create a very special bond between  you and your baby.   Breastfeeding is convenient. Breast milk is always available at the correct temperature and costs nothing.   Breastfeeding helps to burn calories and helps you lose the weight gained during pregnancy.   Breastfeeding makes your uterus contract to its prepregnancy size faster and slows bleeding (lochia) after you give birth.   Breastfeeding helps to lower your risk of developing type 2 diabetes mellitus, osteoporosis, and breast or ovarian cancer later in life. SIGNS THAT YOUR BABY IS HUNGRY Early Signs of Hunger  Increased alertness or activity.  Stretching.  Movement of the head from side to side.  Movement of the head and opening of the mouth when the corner of the mouth or cheek is stroked (rooting).  Increased sucking sounds, smacking lips, cooing, sighing, or squeaking.  Hand-to-mouth movements.  Increased sucking of fingers or hands. Late Signs of Hunger  Fussing.  Intermittent crying. Extreme Signs of Hunger Signs of extreme hunger will require calming and consoling before your baby will be able to breastfeed successfully. Do not wait for the following signs of extreme hunger to occur before you initiate breastfeeding:   Restlessness.  A loud, strong cry.   Screaming. BREASTFEEDING BASICS Breastfeeding Initiation  Find a comfortable place to sit or lie down, with your neck and back well supported.  Place a pillow or  rolled up blanket under your baby to bring him or her to the level of your breast (if you are seated). Nursing pillows are specially designed to help support your arms and your baby while you breastfeed.  Make sure that your baby's abdomen is facing your abdomen.   Gently massage your breast. With your fingertips, massage from your chest wall toward your nipple in a circular motion. This encourages milk flow. You may need to continue this action during the feeding if your milk flows slowly.  Support your breast with 4 fingers underneath and your thumb above your nipple. Make sure your fingers are well away from your nipple and your baby's mouth.   Stroke your baby's lips gently with your finger or nipple.   When your baby's mouth is open wide enough, quickly bring your baby to your breast, placing your entire nipple and as much of the colored area around your nipple (areola) as possible into your baby's mouth.   More areola should be visible above your baby's upper lip than below the lower lip.   Your baby's tongue should be between his or her lower gum and your breast.   Ensure that your baby's mouth is correctly positioned around your nipple (latched). Your baby's lips should create a seal on your breast and be turned out (everted).  It is common for your baby to suck about 2-3 minutes in order to start the flow of breast milk. Latching Teaching your baby how to latch on to your breast properly is very important. An improper latch can cause nipple pain and decreased milk supply for you and poor weight gain in your baby. Also, if your baby is not latched onto your nipple properly, he or she may swallow some air during feeding. This can make your baby fussy. Burping your baby when you switch breasts during the feeding can help to get rid of the air. However, teaching your baby to latch on properly is still the [place] way to prevent fussiness from swallowing air while breastfeeding. Signs  that your baby has successfully latched on to your nipple:    Silent tugging or silent  sucking, without causing you pain.   Swallowing heard between every 3-4 sucks.    Muscle movement above and in front of his or her ears while sucking.  Signs that your baby has not successfully latched on to nipple:   Sucking sounds or smacking sounds from your baby while breastfeeding.  Nipple pain. If you think your baby has not latched on correctly, slip your finger into the corner of your baby's mouth to break the suction and place it between your baby's gums. Attempt breastfeeding initiation again. Signs of Successful Breastfeeding Signs from your baby:   A gradual decrease in the number of sucks or complete cessation of sucking.   Falling asleep.   Relaxation of his or her body.   Retention of a small amount of milk in his or her mouth.   Letting go of your breast by himself or herself. Signs from you:  Breasts that have increased in firmness, weight, and size 1-3 hours after feeding.   Breasts that are softer immediately after breastfeeding.  Increased milk volume, as well as a change in milk consistency and color by the fifth day of breastfeeding.   Nipples that are not sore, cracked, or bleeding. Signs That Your Randel Books is Getting Enough Milk  Wetting at least 3 diapers in a 24-hour period. The urine should be clear and pale yellow by age 69 days.  At least 3 stools in a 24-hour period by age 69 days. The stool should be soft and yellow.  At least 3 stools in a 24-hour period by age 34 days. The stool should be seedy and yellow.  No loss of weight greater than 10% of birth weight during the first 82 days of age.  Average weight gain of 4-7 ounces (113-198 g) per week after age 55 days.  Consistent daily weight gain by age 81 days, without weight loss after the age of 2 weeks. After a feeding, your baby may spit up a small amount. This is common. BREASTFEEDING FREQUENCY AND  DURATION Frequent feeding will help you make more milk and can prevent sore nipples and breast engorgement. Breastfeed when you feel the need to reduce the fullness of your breasts or when your baby shows signs of hunger. This is called "breastfeeding on demand." Avoid introducing a pacifier to your baby while you are working to establish breastfeeding (the first 4-6 weeks after your baby is born). After this time you may choose to use a pacifier. Research has shown that pacifier use during the first year of a baby's life decreases the risk of sudden infant death syndrome (SIDS). Allow your baby to feed on each breast as long as he or she wants. Breastfeed until your baby is finished feeding. When your baby unlatches or falls asleep while feeding from the first breast, offer the second breast. Because newborns are often sleepy in the first few weeks of life, you may need to awaken your baby to get him or her to feed. Breastfeeding times will vary from baby to baby. However, the following rules can serve as a guide to help you ensure that your baby is properly fed:  Newborns (babies 1 weeks of age or younger) may breastfeed every 1-3 hours.  Newborns should not go longer than 3 hours during the day or 5 hours during the night without breastfeeding.  You should breastfeed your baby a minimum of 8 times in a 24-hour period until you begin to introduce solid foods to your baby at around 6  months of age. BREAST MILK PUMPING Pumping and storing breast milk allows you to ensure that your baby is exclusively fed your breast milk, even at times when you are unable to breastfeed. This is especially important if you are going back to work while you are still breastfeeding or when you are not able to be present during feedings. Your lactation consultant can give you guidelines on how long it is safe to store breast milk.  A breast pump is a machine that allows you to pump milk from your breast into a sterile bottle.  The pumped breast milk can then be stored in a refrigerator or freezer. Some breast pumps are operated by hand, while others use electricity. Ask your lactation consultant which type will work [place] for you. Breast pumps can be purchased, but some hospitals and breastfeeding support groups lease breast pumps on a monthly basis. A lactation consultant can teach you how to hand express breast milk, if you prefer not to use a pump.  CARING FOR YOUR BREASTS WHILE YOU BREASTFEED Nipples can become dry, cracked, and sore while breastfeeding. The following recommendations can help keep your breasts moisturized and healthy:  Avoid using soap on your nipples.   Wear a supportive bra. Although not required, special nursing bras and tank tops are designed to allow access to your breasts for breastfeeding without taking off your entire bra or top. Avoid wearing underwire-style bras or extremely tight bras.  Air dry your nipples for 3-31mnutes after each feeding.   Use only cotton bra pads to absorb leaked breast milk. Leaking of breast milk between feedings is normal.   Use lanolin on your nipples after breastfeeding. Lanolin helps to maintain your skin's normal moisture barrier. If you use pure lanolin, you do not need to wash it off before feeding your baby again. Pure lanolin is not toxic to your baby. You may also hand express a few drops of breast milk and gently massage that milk into your nipples and allow the milk to air dry. In the first few weeks after giving birth, some women experience extremely full breasts (engorgement). Engorgement can make your breasts feel heavy, warm, and tender to the touch. Engorgement peaks within 3-5 days after you give birth. The following recommendations can help ease engorgement:  Completely empty your breasts while breastfeeding or pumping. You may want to start by applying warm, moist heat (in the shower or with warm water-soaked hand towels) just before feeding or  pumping. This increases circulation and helps the milk flow. If your baby does not completely empty your breasts while breastfeeding, pump any extra milk after he or she is finished.  Wear a snug bra (nursing or regular) or tank top for 1-2 days to signal your body to slightly decrease milk production.  Apply ice packs to your breasts, unless this is too uncomfortable for you.  Make sure that your baby is latched on and positioned properly while breastfeeding. If engorgement persists after 48 hours of following these recommendations, contact your health care provider or a lScience writer OVERALL HEALTH CARE RECOMMENDATIONS WHILE BREASTFEEDING  Eat healthy foods. Alternate between meals and snacks, eating 3 of each per day. Because what you eat affects your breast milk, some of the foods may make your baby more irritable than usual. Avoid eating these foods if you are sure that they are negatively affecting your baby.  Drink milk, fruit juice, and water to satisfy your thirst (about 10 glasses a day).   Rest  often, relax, and continue to take your prenatal vitamins to prevent fatigue, stress, and anemia.  Continue breast self-awareness checks.  Avoid chewing and smoking tobacco.  Avoid alcohol and drug use. Some medicines that may be harmful to your baby can pass through breast milk. It is important to ask your health care provider before taking any medicine, including all over-the-counter and prescription medicine as well as vitamin and herbal supplements. It is possible to become pregnant while breastfeeding. If birth control is desired, ask your health care provider about options that will be safe for your baby. SEEK MEDICAL CARE IF:   You feel like you want to stop breastfeeding or have become frustrated with breastfeeding.  You have painful breasts or nipples.  Your nipples are cracked or bleeding.  Your breasts are red, tender, or warm.  You have a swollen area on either  breast.  You have a fever or chills.  You have nausea or vomiting.  You have drainage other than breast milk from your nipples.  Your breasts do not become full before feedings by the fifth day after you give birth.  You feel sad and depressed.  Your baby is too sleepy to eat well.  Your baby is having trouble sleeping.   Your baby is wetting less than 3 diapers in a 24-hour period.  Your baby has less than 3 stools in a 24-hour period.  Your baby's skin or the white part of his or her eyes becomes yellow.   Your baby is not gaining weight by 58 days of age. SEEK IMMEDIATE MEDICAL CARE IF:   Your baby is overly tired (lethargic) and does not want to wake up and feed.  Your baby develops an unexplained fever. Document Released: 03/07/2005 Document Revised: 03/12/2013 Document Reviewed: 08/29/2012 [facility] Patient Information 2015 [place], [place]. This information is not intended to replace advice given to you by your health care provider. Make sure you discuss any questions you have with your health care provider. Asthma Attack Prevention Although there is no way to prevent asthma from starting, you can take steps to control the disease and reduce its symptoms. Learn about your asthma and how to control it. Take an active role to control your asthma by working with your health care provider to create and follow an asthma action plan. An asthma action plan guides you in:  Taking your medicines properly.  Avoiding things that set off your asthma or make your asthma worse (asthma triggers).  Tracking your level of asthma control.  Responding to worsening asthma.  Seeking emergency care when needed. To track your asthma, keep records of your symptoms, check your peak flow number using a handheld device that shows how well air moves out of your lungs (peak flow meter), and get regular asthma checkups.  WHAT ARE SOME WAYS TO PREVENT AN ASTHMA ATTACK?  Take medicines as directed  by your health care provider.  Keep track of your asthma symptoms and level of control.  With your health care provider, write a detailed plan for taking medicines and managing an asthma attack. Then be sure to follow your action plan. Asthma is an ongoing condition that needs regular monitoring and treatment.  Identify and avoid asthma triggers. Many outdoor allergens and irritants (such as pollen, mold, cold air, and air pollution) can trigger asthma attacks. Find out what your asthma triggers are and take steps to avoid them.  Monitor your breathing. Learn to recognize warning signs of an attack, such as coughing,  wheezing, or shortness of breath. Your lung function may decrease before you notice any signs or symptoms, so regularly measure and record your peak airflow with a home peak flow meter.  Identify and treat attacks early. If you act quickly, you are less likely to have a severe attack. You will also need less medicine to control your symptoms. When your peak flow measurements decrease and alert you to an upcoming attack, take your medicine as instructed and immediately stop any activity that may have triggered the attack. If your symptoms do not improve, get medical help.  Pay attention to increasing quick-relief inhaler use. If you find yourself relying on your quick-relief inhaler, your asthma is not under control. See your health care provider about adjusting your treatment. WHAT CAN MAKE MY SYMPTOMS WORSE? A number of common things can set off or make your asthma symptoms worse and cause temporary increased inflammation of your airways. Keep track of your asthma symptoms for several weeks, detailing all the environmental and emotional factors that are linked with your asthma. When you have an asthma attack, go back to your asthma diary to see which factor, or combination of factors, might have contributed to it. Once you know what these factors are, you can take steps to control many of  them. If you have allergies and asthma, it is important to take asthma prevention steps at home. Minimizing contact with the substance to which you are allergic will help prevent an asthma attack. Some triggers and ways to avoid these triggers are: Animal Dander:  Some people are allergic to the flakes of skin or dried saliva from animals with fur or feathers.   There is no such thing as a hypoallergenic dog or cat breed. All dogs or cats can cause allergies, even if they don't shed.  Keep these pets out of your home.  If you are not able to keep a pet outdoors, keep the pet out of your bedroom and other sleeping areas at all times, and keep the door closed.  Remove carpets and furniture covered with cloth from your home. If that is not possible, keep the pet away from fabric-covered furniture and carpets. Dust Mites: Many people with asthma are allergic to dust mites. Dust mites are tiny bugs that are found in every home in mattresses, pillows, carpets, fabric-covered furniture, bedcovers, clothes, stuffed toys, and other fabric-covered items.   Cover your mattress in a special dust-proof cover.  Cover your pillow in a special dust-proof cover, or wash the pillow each week in hot water. Water must be hotter than 130 F (54.4 C) to kill dust mites. Cold or warm water used with detergent and bleach can also be effective.  Wash the sheets and blankets on your bed each week in hot water.  Try not to sleep or lie on cloth-covered cushions.  Call ahead when traveling and ask for a smoke-free hotel room. Bring your own bedding and pillows in case the hotel only supplies feather pillows and down comforters, which may contain dust mites and cause asthma symptoms.  Remove carpets from your bedroom and those laid on concrete, if you can.  Keep stuffed toys out of the bed, or wash the toys weekly in hot water or cooler water with detergent and bleach. Cockroaches: Many people with asthma are  allergic to the droppings and remains of cockroaches.   Keep food and garbage in closed containers. Never leave food out.  Use poison baits, traps, powders, gels, or paste (for  example, boric acid).  If a spray is used to kill cockroaches, stay out of the room until the odor goes away. Indoor Mold:  Fix leaky faucets, pipes, or other sources of water that have mold around them.  Clean floors and moldy surfaces with a fungicide or diluted bleach.  Avoid using humidifiers, vaporizers, or swamp coolers. These can spread molds through the air. Pollen and Outdoor Mold:  When pollen or mold spore counts are high, try to keep your windows closed.  Stay indoors with windows closed from late morning to afternoon. Pollen and some mold spore counts are highest at that time.  Ask your health care provider whether you need to take anti-inflammatory medicine or increase your dose of the medicine before your allergy season starts. Other Irritants to Avoid:  Tobacco smoke is an irritant. If you smoke, ask your health care provider how you can quit. Ask family members to quit smoking, too. Do not allow smoking in your home or car.  If possible, do not use a wood-burning stove, kerosene heater, or fireplace. Minimize exposure to all sources of smoke, including incense, candles, fires, and fireworks.  Try to stay away from strong odors and sprays, such as perfume, talcum powder, hair spray, and paints.  Decrease humidity in your home and use an indoor air cleaning device. Reduce indoor humidity to below 60%. Dehumidifiers or central air conditioners can do this.  Decrease house dust exposure by changing furnace and air cooler filters frequently.  Try to have someone else vacuum for you once or twice a week. Stay out of rooms while they are being vacuumed and for a short while afterward.  If you vacuum, use a dust mask from a hardware store, a double-layered or microfilter vacuum cleaner bag, or a  vacuum cleaner with a HEPA filter.  Sulfites in foods and beverages can be irritants. Do not drink beer or wine or eat dried fruit, processed potatoes, or shrimp if they cause asthma symptoms.  Cold air can trigger an asthma attack. Cover your nose and mouth with a scarf on cold or windy days.  Several health conditions can make asthma more difficult to manage, including a runny nose, sinus infections, reflux disease, psychological stress, and sleep apnea. Work with your health care provider to manage these conditions.  Avoid close contact with people who have a respiratory infection such as a cold or the flu, since your asthma symptoms may get worse if you catch the infection. Wash your hands thoroughly after touching items that may have been handled by people with a respiratory infection.  Get a flu shot every year to protect against the flu virus, which often makes asthma worse for days or weeks. Also get a pneumonia shot if you have not previously had one. Unlike the flu shot, the pneumonia shot does not need to be given yearly. Medicines:  Talk to your health care provider about whether it is safe for you to take aspirin or non-steroidal anti-inflammatory medicines (NSAIDs). In a small number of people with asthma, aspirin and NSAIDs can cause asthma attacks. These medicines must be avoided by people who have known aspirin-sensitive asthma. It is important that people with aspirin-sensitive asthma read labels of all over-the-counter medicines used to treat pain, colds, coughs, and fever.  Beta-blockers and ACE inhibitors are other medicines you should discuss with your health care provider. HOW CAN I FIND OUT WHAT I AM ALLERGIC TO? Ask your asthma health care provider about allergy skin testing or  blood testing (the RAST test) to identify the allergens to which you are sensitive. If you are found to have allergies, the most important thing to do is to try to avoid exposure to any allergens that  you are sensitive to as much as possible. Other treatments for allergies, such as medicines and allergy shots (immunotherapy) are available.  CAN I EXERCISE? Follow your health care provider's advice regarding asthma treatment before exercising. It is important to maintain a regular exercise program, but vigorous exercise or exercise in cold, humid, or dry environments can cause asthma attacks, especially for those people who have exercise-induced asthma. Document Released: 02/23/2009 Document Revised: 03/12/2013 Document Reviewed: 09/12/2012 [facility] Patient Information 2015 [place], [place]. This information is not intended to replace advice given to you by your health care provider. Make sure you discuss any questions you have with your health care provider.

## 2014-04-08 ENCOUNTER — Ambulatory Visit (INDEPENDENT_AMBULATORY_CARE_PROVIDER_SITE_OTHER): Payer: BLUE CROSS/BLUE SHIELD | Admitting: Family Medicine

## 2014-04-08 VITALS — BP 110/59 | HR 76 | Wt 192.0 lb

## 2014-04-08 DIAGNOSIS — O219 Vomiting of pregnancy, unspecified: Secondary | ICD-10-CM

## 2014-04-08 DIAGNOSIS — Z3491 Encounter for supervision of normal pregnancy, unspecified, first trimester: Secondary | ICD-10-CM

## 2014-04-08 MED ORDER — PROMETHAZINE HCL 25 MG PO TABS: 25.0000 mg | ORAL_TABLET | Freq: Four times a day (QID) | ORAL | Status: DC | PRN

## 2014-04-08 MED ORDER — DOXYLAMINE-PYRIDOXINE 10-10 MG PO TBEC
2.0000 | DELAYED_RELEASE_TABLET | Freq: Two times a day (BID) | ORAL | Status: DC
Start: 2014-04-08 — End: 2014-05-02

## 2014-04-08 NOTE — Patient Instructions (Signed)
Second Trimester of Pregnancy The second trimester is from week 13 through week 28, months 4 through 6. The second trimester is often a time when you feel your best. Your body has also adjusted to being pregnant, and you begin to feel better physically. Usually, morning sickness has lessened or quit completely, you may have more energy, and you may have an increase in appetite. The second trimester is also a time when the fetus is growing rapidly. At the end of the sixth month, the fetus is about 9 inches long and weighs about 1 pounds. You will likely begin to feel the baby move (quickening) between 18 and 20 weeks of the pregnancy. BODY CHANGES Your body goes through many changes during pregnancy. The changes vary from woman to woman.   Your weight will continue to increase. You will notice your lower abdomen bulging out.  You may begin to get stretch marks on your hips, abdomen, and breasts.  You may develop headaches that can be relieved by medicines approved by your health care provider.  You may urinate more often because the fetus is pressing on your bladder.  You may develop or continue to have heartburn as a result of your pregnancy.  You may develop constipation because certain hormones are causing the muscles that push waste through your intestines to slow down.  You may develop hemorrhoids or swollen, bulging veins (varicose veins).  You may have back pain because of the weight gain and pregnancy hormones relaxing your joints between the bones in your pelvis and as a result of a shift in weight and the muscles that support your balance.  Your breasts will continue to grow and be tender.  Your gums may bleed and may be sensitive to brushing and flossing.  Dark spots or blotches (chloasma, mask of pregnancy) may develop on your face. This will likely fade after the baby is born.  A dark line from your belly button to the pubic area (linea nigra) may appear. This will likely  fade after the baby is born.  You may have changes in your hair. These can include thickening of your hair, rapid growth, and changes in texture. Some women also have hair loss during or after pregnancy, or hair that feels dry or thin. Your hair will most likely return to normal after your baby is born. WHAT TO EXPECT AT YOUR PRENATAL VISITS During a routine prenatal visit:  You will be weighed to make sure you and the fetus are growing normally.  Your blood pressure will be taken.  Your abdomen will be measured to track your baby's growth.  The fetal heartbeat will be listened to.  Any test results from the previous visit will be discussed. Your health care provider may ask you:  How you are feeling.  If you are feeling the baby move.  If you have had any abnormal symptoms, such as leaking fluid, bleeding, severe headaches, or abdominal cramping.  If you have any questions. Other tests that may be performed during your second trimester include:  Blood tests that check for:  Low iron levels (anemia).  Gestational diabetes (between 24 and 28 weeks).  Rh antibodies.  Urine tests to check for infections, diabetes, or protein in the urine.  An ultrasound to confirm the proper growth and development of the baby.  An amniocentesis to check for possible genetic problems.  Fetal screens for spina bifida and Down syndrome. HOME CARE INSTRUCTIONS   Avoid all smoking, herbs, alcohol, and unprescribed   drugs. These chemicals affect the formation and growth of the baby.  Follow your health care provider's instructions regarding medicine use. There are medicines that are either safe or unsafe to take during pregnancy.  Exercise only as directed by your health care provider. Experiencing uterine cramps is a good sign to stop exercising.  Continue to eat regular, healthy meals.  Wear a good support bra for breast tenderness.  Do not use hot tubs, steam rooms, or saunas.  Wear  your seat belt at all times when driving.  Avoid raw meat, uncooked cheese, cat litter boxes, and soil used by cats. These carry germs that can cause birth defects in the baby.  Take your prenatal vitamins.  Try taking a stool softener (if your health care provider approves) if you develop constipation. Eat more high-fiber foods, such as fresh vegetables or fruit and whole grains. Drink plenty of fluids to keep your urine clear or pale yellow.  Take warm sitz baths to soothe any pain or discomfort caused by hemorrhoids. Use hemorrhoid cream if your health care provider approves.  If you develop varicose veins, wear support hose. Elevate your feet for 15 minutes, 3-4 times a day. Limit salt in your diet.  Avoid heavy lifting, wear low heel shoes, and practice good posture.  Rest with your legs elevated if you have leg cramps or low back pain.  Visit your dentist if you have not gone yet during your pregnancy. Use a soft toothbrush to brush your teeth and be gentle when you floss.  A sexual relationship may be continued unless your health care provider directs you otherwise.  Continue to go to all your prenatal visits as directed by your health care provider. SEEK MEDICAL CARE IF:   You have dizziness.  You have mild pelvic cramps, pelvic pressure, or nagging pain in the abdominal area.  You have persistent nausea, vomiting, or diarrhea.  You have a bad smelling vaginal discharge.  You have pain with urination. SEEK IMMEDIATE MEDICAL CARE IF:   You have a fever.  You are leaking fluid from your vagina.  You have spotting or bleeding from your vagina.  You have severe abdominal cramping or pain.  You have rapid weight gain or loss.  You have shortness of breath with chest pain.  You notice sudden or extreme swelling of your face, hands, ankles, feet, or legs.  You have not felt your baby move in over an hour.  You have severe headaches that do not go away with  medicine.  You have vision changes. Document Released: 03/01/2001 Document Revised: 03/12/2013 Document Reviewed: 05/08/2012 [employer] Patient Information 2015 [employer]. This information is not intended to replace advice given to you by your health care provider. Make sure you discuss any questions you have with your health care provider.  Breastfeeding Deciding to breastfeed is one of the best choices you can make for you and your baby. A change in hormones during pregnancy causes your breast tissue to grow and increases the number and size of your milk ducts. These hormones also allow proteins, sugars, and fats from your blood supply to make breast milk in your milk-producing glands. Hormones prevent breast milk from being released before your baby is born as well as prompt milk flow after birth. Once breastfeeding has begun, thoughts of your baby, as well as his or her sucking or crying, can stimulate the release of milk from your milk-producing glands.  BENEFITS OF BREASTFEEDING For Your Baby  Your first   milk (colostrum) helps your baby's digestive system function better.   There are antibodies in your milk that help your baby fight off infections.   Your baby has a lower incidence of asthma, allergies, and sudden infant death syndrome.   The nutrients in breast milk are better for your baby than infant formulas and are designed uniquely for your baby's needs.   Breast milk improves your baby's brain development.   Your baby is less likely to develop other conditions, such as childhood obesity, asthma, or type 2 diabetes mellitus.  For You   Breastfeeding helps to create a very special bond between you and your baby.   Breastfeeding is convenient. Breast milk is always available at the correct temperature and costs nothing.   Breastfeeding helps to burn calories and helps you lose the weight gained during pregnancy.   Breastfeeding makes your uterus contract to its  prepregnancy size faster and slows bleeding (lochia) after you give birth.   Breastfeeding helps to lower your risk of developing type 2 diabetes mellitus, osteoporosis, and breast or ovarian cancer later in life. SIGNS THAT YOUR BABY IS HUNGRY Early Signs of Hunger  Increased alertness or activity.  Stretching.  Movement of the head from side to side.  Movement of the head and opening of the mouth when the corner of the mouth or cheek is stroked (rooting).  Increased sucking sounds, smacking lips, cooing, sighing, or squeaking.  Hand-to-mouth movements.  Increased sucking of fingers or hands. Late Signs of Hunger  Fussing.  Intermittent crying. Extreme Signs of Hunger Signs of extreme hunger will require calming and consoling before your baby will be able to breastfeed successfully. Do not wait for the following signs of extreme hunger to occur before you initiate breastfeeding:   Restlessness.  A loud, strong cry.   Screaming. BREASTFEEDING BASICS Breastfeeding Initiation  Find a comfortable place to sit or lie down, with your neck and back well supported.  Place a pillow or rolled up blanket under your baby to bring him or her to the level of your breast (if you are seated). Nursing pillows are specially designed to help support your arms and your baby while you breastfeed.  Make sure that your baby's abdomen is facing your abdomen.   Gently massage your breast. With your fingertips, massage from your chest wall toward your nipple in a circular motion. This encourages milk flow. You may need to continue this action during the feeding if your milk flows slowly.  Support your breast with 4 fingers underneath and your thumb above your nipple. Make sure your fingers are well away from your nipple and your baby's mouth.   Stroke your baby's lips gently with your finger or nipple.   When your baby's mouth is open wide enough, quickly bring your baby to your breast,  placing your entire nipple and as much of the colored area around your nipple (areola) as possible into your baby's mouth.   More areola should be visible above your baby's upper lip than below the lower lip.   Your baby's tongue should be between his or her lower gum and your breast.   Ensure that your baby's mouth is correctly positioned around your nipple (latched). Your baby's lips should create a seal on your breast and be turned out (everted).  It is common for your baby to suck about 2-3 minutes in order to start the flow of breast milk. Latching Teaching your baby how to latch on to your breast   properly is very important. An improper latch can cause nipple pain and decreased milk supply for you and poor weight gain in your baby. Also, if your baby is not latched onto your nipple properly, he or she may swallow some air during feeding. This can make your baby fussy. Burping your baby when you switch breasts during the feeding can help to get rid of the air. However, teaching your baby to latch on properly is still the best way to prevent fussiness from swallowing air while breastfeeding. Signs that your baby has successfully latched on to your nipple:    Silent tugging or silent sucking, without causing you pain.   Swallowing heard between every 3-4 sucks.    Muscle movement above and in front of his or her ears while sucking.  Signs that your baby has not successfully latched on to nipple:   Sucking sounds or smacking sounds from your baby while breastfeeding.  Nipple pain. If you think your baby has not latched on correctly, slip your finger into the corner of your baby's mouth to break the suction and place it between your baby's gums. Attempt breastfeeding initiation again. Signs of Successful Breastfeeding Signs from your baby:   A gradual decrease in the number of sucks or complete cessation of sucking.   Falling asleep.   Relaxation of his or her body.    Retention of a small amount of milk in his or her mouth.   Letting go of your breast by himself or herself. Signs from you:  Breasts that have increased in firmness, weight, and size 1-3 hours after feeding.   Breasts that are softer immediately after breastfeeding.  Increased milk volume, as well as a change in milk consistency and color by the fifth day of breastfeeding.   Nipples that are not sore, cracked, or bleeding. Signs That Your Baby is Getting Enough Milk  Wetting at least 3 diapers in a 24-hour period. The urine should be clear and pale yellow by age 5 days.  At least 3 stools in a 24-hour period by age 5 days. The stool should be soft and yellow.  At least 3 stools in a 24-hour period by age 7 days. The stool should be seedy and yellow.  No loss of weight greater than 10% of birth weight during the first 3 days of age.  Average weight gain of 4-7 ounces (113-198 g) per week after age 4 days.  Consistent daily weight gain by age 5 days, without weight loss after the age of 2 weeks. After a feeding, your baby may spit up a small amount. This is common. BREASTFEEDING FREQUENCY AND DURATION Frequent feeding will help you make more milk and can prevent sore nipples and breast engorgement. Breastfeed when you feel the need to reduce the fullness of your breasts or when your baby shows signs of hunger. This is called "breastfeeding on demand." Avoid introducing a pacifier to your baby while you are working to establish breastfeeding (the first 4-6 weeks after your baby is born). After this time you may choose to use a pacifier. Research has shown that pacifier use during the first year of a baby's life decreases the risk of sudden infant death syndrome (SIDS). Allow your baby to feed on each breast as long as he or she wants. Breastfeed until your baby is finished feeding. When your baby unlatches or falls asleep while feeding from the first breast, offer the second breast.  Because newborns are often sleepy in the   first few weeks of life, you may need to awaken your baby to get him or her to feed. Breastfeeding times will vary from baby to baby. However, the following rules can serve as a guide to help you ensure that your baby is properly fed:  Newborns (babies 4 weeks of age or younger) may breastfeed every 1-3 hours.  Newborns should not go longer than 3 hours during the day or 5 hours during the night without breastfeeding.  You should breastfeed your baby a minimum of 8 times in a 24-hour period until you begin to introduce solid foods to your baby at around 6 months of age. BREAST MILK PUMPING Pumping and storing breast milk allows you to ensure that your baby is exclusively fed your breast milk, even at times when you are unable to breastfeed. This is especially important if you are going back to work while you are still breastfeeding or when you are not able to be present during feedings. Your lactation consultant can give you guidelines on how long it is safe to store breast milk.  A breast pump is a machine that allows you to pump milk from your breast into a sterile bottle. The pumped breast milk can then be stored in a refrigerator or freezer. Some breast pumps are operated by hand, while others use electricity. Ask your lactation consultant which type will work best for you. Breast pumps can be purchased, but some hospitals and breastfeeding support groups lease breast pumps on a monthly basis. A lactation consultant can teach you how to hand express breast milk, if you prefer not to use a pump.  CARING FOR YOUR BREASTS WHILE YOU BREASTFEED Nipples can become dry, cracked, and sore while breastfeeding. The following recommendations can help keep your breasts moisturized and healthy:  Avoid using soap on your nipples.   Wear a supportive bra. Although not required, special nursing bras and tank tops are designed to allow access to your breasts for  breastfeeding without taking off your entire bra or top. Avoid wearing underwire-style bras or extremely tight bras.  Air dry your nipples for 3-4minutes after each feeding.   Use only cotton bra pads to absorb leaked breast milk. Leaking of breast milk between feedings is normal.   Use lanolin on your nipples after breastfeeding. Lanolin helps to maintain your skin's normal moisture barrier. If you use pure lanolin, you do not need to wash it off before feeding your baby again. Pure lanolin is not toxic to your baby. You may also hand express a few drops of breast milk and gently massage that milk into your nipples and allow the milk to air dry. In the first few weeks after giving birth, some women experience extremely full breasts (engorgement). Engorgement can make your breasts feel heavy, warm, and tender to the touch. Engorgement peaks within 3-5 days after you give birth. The following recommendations can help ease engorgement:  Completely empty your breasts while breastfeeding or pumping. You may want to start by applying warm, moist heat (in the shower or with warm water-soaked hand towels) just before feeding or pumping. This increases circulation and helps the milk flow. If your baby does not completely empty your breasts while breastfeeding, pump any extra milk after he or she is finished.  Wear a snug bra (nursing or regular) or tank top for 1-2 days to signal your body to slightly decrease milk production.  Apply ice packs to your breasts, unless this is too uncomfortable for you.    Make sure that your baby is latched on and positioned properly while breastfeeding. If engorgement persists after 48 hours of following these recommendations, contact your health care provider or a lactation consultant. OVERALL HEALTH CARE RECOMMENDATIONS WHILE BREASTFEEDING  Eat healthy foods. Alternate between meals and snacks, eating 3 of each per day. Because what you eat affects your breast milk,  some of the foods may make your baby more irritable than usual. Avoid eating these foods if you are sure that they are negatively affecting your baby.  Drink milk, fruit juice, and water to satisfy your thirst (about 10 glasses a day).   Rest often, relax, and continue to take your prenatal vitamins to prevent fatigue, stress, and anemia.  Continue breast self-awareness checks.  Avoid chewing and smoking tobacco.  Avoid alcohol and drug use. Some medicines that may be harmful to your baby can pass through breast milk. It is important to ask your health care provider before taking any medicine, including all over-the-counter and prescription medicine as well as vitamin and herbal supplements. It is possible to become pregnant while breastfeeding. If birth control is desired, ask your health care provider about options that will be safe for your baby. SEEK MEDICAL CARE IF:   You feel like you want to stop breastfeeding or have become frustrated with breastfeeding.  You have painful breasts or nipples.  Your nipples are cracked or bleeding.  Your breasts are red, tender, or warm.  You have a swollen area on either breast.  You have a fever or chills.  You have nausea or vomiting.  You have drainage other than breast milk from your nipples.  Your breasts do not become full before feedings by the fifth day after you give birth.  You feel sad and depressed.  Your baby is too sleepy to eat well.  Your baby is having trouble sleeping.   Your baby is wetting less than 3 diapers in a 24-hour period.  Your baby has less than 3 stools in a 24-hour period.  Your baby's skin or the white part of his or her eyes becomes yellow.   Your baby is not gaining weight by 5 days of age. SEEK IMMEDIATE MEDICAL CARE IF:   Your baby is overly tired (lethargic) and does not want to wake up and feed.  Your baby develops an unexplained fever. Document Released: 03/07/2005 Document Revised:  03/12/2013 Document Reviewed: 08/29/2012 [employer] Patient Information 2015 [employer]. This information is not intended to replace advice given to you by your health care provider. Make sure you discuss any questions you have with your health care provider.  

## 2014-04-08 NOTE — Progress Notes (Signed)
Has lost another pound--will try to increase her diclegis. Does feel improvement with flovent

## 2014-04-09 ENCOUNTER — Ambulatory Visit (HOSPITAL_COMMUNITY)
Admission: RE | Admit: 2014-04-09 | Discharge: 2014-04-09 | Disposition: A | Discharge status: Home / Self Care | Payer: BLUE CROSS/BLUE SHIELD | Source: Ambulatory Visit | Attending: Family Medicine | Admitting: Family Medicine

## 2014-04-09 ENCOUNTER — Encounter (HOSPITAL_COMMUNITY): Payer: Self-pay

## 2014-04-09 DIAGNOSIS — Z3A12 12 weeks gestation of pregnancy: Secondary | ICD-10-CM | POA: Insufficient documentation

## 2014-04-09 DIAGNOSIS — Z3491 Encounter for supervision of normal pregnancy, unspecified, first trimester: Secondary | ICD-10-CM

## 2014-04-09 DIAGNOSIS — O09211 Supervision of pregnancy with history of pre-term labor, first trimester: Secondary | ICD-10-CM | POA: Insufficient documentation

## 2014-04-09 DIAGNOSIS — Z36 Encounter for antenatal screening of mother: Secondary | ICD-10-CM | POA: Insufficient documentation

## 2014-04-09 DIAGNOSIS — Z3682 Encounter for antenatal screening for nuchal translucency: Secondary | ICD-10-CM | POA: Insufficient documentation

## 2014-04-09 DIAGNOSIS — Z8279 Family history of other congenital malformations, deformations and chromosomal abnormalities: Secondary | ICD-10-CM

## 2014-04-14 ENCOUNTER — Telehealth: Payer: Self-pay | Admitting: *Deleted

## 2014-04-14 MED ORDER — ONDANSETRON HCL 8 MG PO TABS: 8.0000 mg | ORAL_TABLET | Freq: Three times a day (TID) | ORAL | Status: DC | PRN

## 2014-04-14 NOTE — Telephone Encounter (Signed)
Patient is still vomiting every day and was told if the Diclegis did not help that we could call in Zofran for her.  I have sent this to the pharmacy.

## 2014-04-15 ENCOUNTER — Other Ambulatory Visit (HOSPITAL_COMMUNITY): Payer: Self-pay

## 2014-05-02 ENCOUNTER — Encounter: Payer: Self-pay | Admitting: Obstetrics & Gynecology

## 2014-05-02 ENCOUNTER — Ambulatory Visit (INDEPENDENT_AMBULATORY_CARE_PROVIDER_SITE_OTHER): Payer: BLUE CROSS/BLUE SHIELD | Admitting: Obstetrics & Gynecology

## 2014-05-02 VITALS — BP 113/60 | HR 79 | Wt 193.0 lb

## 2014-05-02 DIAGNOSIS — Z3493 Encounter for supervision of normal pregnancy, unspecified, third trimester: Secondary | ICD-10-CM

## 2014-05-02 MED ORDER — BUTALBITAL-ASPIRIN-CAFFEINE 50-325-40 MG PO CAPS
1.0000 | ORAL_CAPSULE | Freq: Four times a day (QID) | ORAL | Status: DC | PRN
Start: 2014-05-02 — End: 2014-11-10

## 2014-05-02 NOTE — Progress Notes (Signed)
Doing well with the Zofran for her nausea.

## 2014-05-02 NOTE — Progress Notes (Signed)
Routine visit.  Anatomy u/s scheduled for 05/21/14. Early glucola at next visit. MSAFP at next visit. She is complaining of a return of her tension headaches. She is requesting the fiorocet that she used with her last pregnancy. She thinks some of the tension is from not taking xanax any longer. I will give her some fiorocet and rec an appt with Allie Dimmer for headaches. I have also recommended prenatal yoga and meditation.

## 2014-05-21 ENCOUNTER — Ambulatory Visit (HOSPITAL_COMMUNITY)
Admission: RE | Admit: 2014-05-21 | Discharge: 2014-05-21 | Disposition: A | Discharge status: Home / Self Care | Payer: BLUE CROSS/BLUE SHIELD | Source: Ambulatory Visit | Attending: Family Medicine | Admitting: Family Medicine

## 2014-05-21 ENCOUNTER — Encounter (HOSPITAL_COMMUNITY): Payer: Self-pay

## 2014-05-21 DIAGNOSIS — Z36 Encounter for antenatal screening of mother: Secondary | ICD-10-CM | POA: Insufficient documentation

## 2014-05-21 DIAGNOSIS — O09292 Supervision of pregnancy with other poor reproductive or obstetric history, second trimester: Secondary | ICD-10-CM | POA: Diagnosis not present

## 2014-05-21 DIAGNOSIS — Z3A18 18 weeks gestation of pregnancy: Secondary | ICD-10-CM | POA: Insufficient documentation

## 2014-05-21 DIAGNOSIS — Z3689 Encounter for other specified antenatal screening: Secondary | ICD-10-CM | POA: Insufficient documentation

## 2014-05-21 DIAGNOSIS — O352XX0 Maternal care for (suspected) hereditary disease in fetus, not applicable or unspecified: Secondary | ICD-10-CM | POA: Diagnosis not present

## 2014-05-21 DIAGNOSIS — Z8279 Family history of other congenital malformations, deformations and chromosomal abnormalities: Secondary | ICD-10-CM

## 2014-05-30 ENCOUNTER — Ambulatory Visit (INDEPENDENT_AMBULATORY_CARE_PROVIDER_SITE_OTHER): Payer: BLUE CROSS/BLUE SHIELD | Admitting: Family Medicine

## 2014-05-30 VITALS — BP 110/68 | HR 84 | Wt 192.0 lb

## 2014-05-30 DIAGNOSIS — Z36 Encounter for antenatal screening of mother: Secondary | ICD-10-CM

## 2014-05-30 DIAGNOSIS — Z3493 Encounter for supervision of normal pregnancy, unspecified, third trimester: Secondary | ICD-10-CM

## 2014-05-30 NOTE — Addendum Note (Signed)
Addended by: [name] on: 05/30/2014 11:27 AM   Modules accepted: Orders

## 2014-05-30 NOTE — Patient Instructions (Signed)
Second Trimester of Pregnancy The second trimester is from week 13 through week 28, months 4 through 6. The second trimester is often a time when you feel your best. Your body has also adjusted to being pregnant, and you begin to feel better physically. Usually, morning sickness has lessened or quit completely, you may have more energy, and you may have an increase in appetite. The second trimester is also a time when the fetus is growing rapidly. At the end of the sixth month, the fetus is about 9 inches long and weighs about 1 pounds. You will likely begin to feel the baby move (quickening) between 18 and 20 weeks of the pregnancy. BODY CHANGES Your body goes through many changes during pregnancy. The changes vary from woman to woman.   Your weight will continue to increase. You will notice your lower abdomen bulging out.  You may begin to get stretch marks on your hips, abdomen, and breasts.  You may develop headaches that can be relieved by medicines approved by your health care provider.  You may urinate more often because the fetus is pressing on your bladder.  You may develop or continue to have heartburn as a result of your pregnancy.  You may develop constipation because certain hormones are causing the muscles that push waste through your intestines to slow down.  You may develop hemorrhoids or swollen, bulging veins (varicose veins).  You may have back pain because of the weight gain and pregnancy hormones relaxing your joints between the bones in your pelvis and as a result of a shift in weight and the muscles that support your balance.  Your breasts will continue to grow and be tender.  Your gums may bleed and may be sensitive to brushing and flossing.  Dark spots or blotches (chloasma, mask of pregnancy) may develop on your face. This will likely fade after the baby is born.  A dark line from your belly button to the pubic area (linea nigra) may appear. This will likely  fade after the baby is born.  You may have changes in your hair. These can include thickening of your hair, rapid growth, and changes in texture. Some women also have hair loss during or after pregnancy, or hair that feels dry or thin. Your hair will most likely return to normal after your baby is born. WHAT TO EXPECT AT YOUR PRENATAL VISITS During a routine prenatal visit:  You will be weighed to make sure you and the fetus are growing normally.  Your blood pressure will be taken.  Your abdomen will be measured to track your baby's growth.  The fetal heartbeat will be listened to.  Any test results from the previous visit will be discussed. Your health care provider may ask you:  How you are feeling.  If you are feeling the baby move.  If you have had any abnormal symptoms, such as leaking fluid, bleeding, severe headaches, or abdominal cramping.  If you have any questions. Other tests that may be performed during your second trimester include:  Blood tests that check for:  Low iron levels (anemia).  Gestational diabetes (between 24 and 28 weeks).  Rh antibodies.  Urine tests to check for infections, diabetes, or protein in the urine.  An ultrasound to confirm the proper growth and development of the baby.  An amniocentesis to check for possible genetic problems.  Fetal screens for spina bifida and Down syndrome. HOME CARE INSTRUCTIONS   Avoid all smoking, herbs, alcohol, and unprescribed   drugs. These chemicals affect the formation and growth of the baby.  Follow your health care provider's instructions regarding medicine use. There are medicines that are either safe or unsafe to take during pregnancy.  Exercise only as directed by your health care provider. Experiencing uterine cramps is a good sign to stop exercising.  Continue to eat regular, healthy meals.  Wear a good support bra for breast tenderness.  Do not use hot tubs, steam rooms, or saunas.  Wear  your seat belt at all times when driving.  Avoid raw meat, uncooked cheese, cat litter boxes, and soil used by cats. These carry germs that can cause birth defects in the baby.  Take your prenatal vitamins.  Try taking a stool softener (if your health care provider approves) if you develop constipation. Eat more high-fiber foods, such as fresh vegetables or fruit and whole grains. Drink plenty of fluids to keep your urine clear or pale yellow.  Take warm sitz baths to soothe any pain or discomfort caused by hemorrhoids. Use hemorrhoid cream if your health care provider approves.  If you develop varicose veins, wear support hose. Elevate your feet for 15 minutes, 3-4 times a day. Limit salt in your diet.  Avoid heavy lifting, wear low heel shoes, and practice good posture.  Rest with your legs elevated if you have leg cramps or low back pain.  Visit your dentist if you have not gone yet during your pregnancy. Use a soft toothbrush to brush your teeth and be gentle when you floss.  A sexual relationship may be continued unless your health care provider directs you otherwise.  Continue to go to all your prenatal visits as directed by your health care provider. SEEK MEDICAL CARE IF:   You have dizziness.  You have mild pelvic cramps, pelvic pressure, or nagging pain in the abdominal area.  You have persistent nausea, vomiting, or diarrhea.  You have a bad smelling vaginal discharge.  You have pain with urination. SEEK IMMEDIATE MEDICAL CARE IF:   You have a fever.  You are leaking fluid from your vagina.  You have spotting or bleeding from your vagina.  You have severe abdominal cramping or pain.  You have rapid weight gain or loss.  You have shortness of breath with chest pain.  You notice sudden or extreme swelling of your face, hands, ankles, feet, or legs.  You have not felt your baby move in over an hour.  You have severe headaches that do not go away with  medicine.  You have vision changes. Document Released: 03/01/2001 Document Revised: 03/12/2013 Document Reviewed: 05/08/2012 [employer] Patient Information 2015 [employer]. This information is not intended to replace advice given to you by your health care provider. Make sure you discuss any questions you have with your health care provider.  Breastfeeding Deciding to breastfeed is one of the best choices you can make for you and your baby. A change in hormones during pregnancy causes your breast tissue to grow and increases the number and size of your milk ducts. These hormones also allow proteins, sugars, and fats from your blood supply to make breast milk in your milk-producing glands. Hormones prevent breast milk from being released before your baby is born as well as prompt milk flow after birth. Once breastfeeding has begun, thoughts of your baby, as well as his or her sucking or crying, can stimulate the release of milk from your milk-producing glands.  BENEFITS OF BREASTFEEDING For Your Baby  Your first   milk (colostrum) helps your baby's digestive system function better.   There are antibodies in your milk that help your baby fight off infections.   Your baby has a lower incidence of asthma, allergies, and sudden infant death syndrome.   The nutrients in breast milk are better for your baby than infant formulas and are designed uniquely for your baby's needs.   Breast milk improves your baby's brain development.   Your baby is less likely to develop other conditions, such as childhood obesity, asthma, or type 2 diabetes mellitus.  For You   Breastfeeding helps to create a very special bond between you and your baby.   Breastfeeding is convenient. Breast milk is always available at the correct temperature and costs nothing.   Breastfeeding helps to burn calories and helps you lose the weight gained during pregnancy.   Breastfeeding makes your uterus contract to its  prepregnancy size faster and slows bleeding (lochia) after you give birth.   Breastfeeding helps to lower your risk of developing type 2 diabetes mellitus, osteoporosis, and breast or ovarian cancer later in life. SIGNS THAT YOUR BABY IS HUNGRY Early Signs of Hunger  Increased alertness or activity.  Stretching.  Movement of the head from side to side.  Movement of the head and opening of the mouth when the corner of the mouth or cheek is stroked (rooting).  Increased sucking sounds, smacking lips, cooing, sighing, or squeaking.  Hand-to-mouth movements.  Increased sucking of fingers or hands. Late Signs of Hunger  Fussing.  Intermittent crying. Extreme Signs of Hunger Signs of extreme hunger will require calming and consoling before your baby will be able to breastfeed successfully. Do not wait for the following signs of extreme hunger to occur before you initiate breastfeeding:   Restlessness.  A loud, strong cry.   Screaming. BREASTFEEDING BASICS Breastfeeding Initiation  Find a comfortable place to sit or lie down, with your neck and back well supported.  Place a pillow or rolled up blanket under your baby to bring him or her to the level of your breast (if you are seated). Nursing pillows are specially designed to help support your arms and your baby while you breastfeed.  Make sure that your baby's abdomen is facing your abdomen.   Gently massage your breast. With your fingertips, massage from your chest wall toward your nipple in a circular motion. This encourages milk flow. You may need to continue this action during the feeding if your milk flows slowly.  Support your breast with 4 fingers underneath and your thumb above your nipple. Make sure your fingers are well away from your nipple and your baby's mouth.   Stroke your baby's lips gently with your finger or nipple.   When your baby's mouth is open wide enough, quickly bring your baby to your breast,  placing your entire nipple and as much of the colored area around your nipple (areola) as possible into your baby's mouth.   More areola should be visible above your baby's upper lip than below the lower lip.   Your baby's tongue should be between his or her lower gum and your breast.   Ensure that your baby's mouth is correctly positioned around your nipple (latched). Your baby's lips should create a seal on your breast and be turned out (everted).  It is common for your baby to suck about 2-3 minutes in order to start the flow of breast milk. Latching Teaching your baby how to latch on to your breast   properly is very important. An improper latch can cause nipple pain and decreased milk supply for you and poor weight gain in your baby. Also, if your baby is not latched onto your nipple properly, he or she may swallow some air during feeding. This can make your baby fussy. Burping your baby when you switch breasts during the feeding can help to get rid of the air. However, teaching your baby to latch on properly is still the best way to prevent fussiness from swallowing air while breastfeeding. Signs that your baby has successfully latched on to your nipple:    Silent tugging or silent sucking, without causing you pain.   Swallowing heard between every 3-4 sucks.    Muscle movement above and in front of his or her ears while sucking.  Signs that your baby has not successfully latched on to nipple:   Sucking sounds or smacking sounds from your baby while breastfeeding.  Nipple pain. If you think your baby has not latched on correctly, slip your finger into the corner of your baby's mouth to break the suction and place it between your baby's gums. Attempt breastfeeding initiation again. Signs of Successful Breastfeeding Signs from your baby:   A gradual decrease in the number of sucks or complete cessation of sucking.   Falling asleep.   Relaxation of his or her body.    Retention of a small amount of milk in his or her mouth.   Letting go of your breast by himself or herself. Signs from you:  Breasts that have increased in firmness, weight, and size 1-3 hours after feeding.   Breasts that are softer immediately after breastfeeding.  Increased milk volume, as well as a change in milk consistency and color by the fifth day of breastfeeding.   Nipples that are not sore, cracked, or bleeding. Signs That Your Baby is Getting Enough Milk  Wetting at least 3 diapers in a 24-hour period. The urine should be clear and pale yellow by age 5 days.  At least 3 stools in a 24-hour period by age 5 days. The stool should be soft and yellow.  At least 3 stools in a 24-hour period by age 7 days. The stool should be seedy and yellow.  No loss of weight greater than 10% of birth weight during the first 3 days of age.  Average weight gain of 4-7 ounces (113-198 g) per week after age 4 days.  Consistent daily weight gain by age 5 days, without weight loss after the age of 2 weeks. After a feeding, your baby may spit up a small amount. This is common. BREASTFEEDING FREQUENCY AND DURATION Frequent feeding will help you make more milk and can prevent sore nipples and breast engorgement. Breastfeed when you feel the need to reduce the fullness of your breasts or when your baby shows signs of hunger. This is called "breastfeeding on demand." Avoid introducing a pacifier to your baby while you are working to establish breastfeeding (the first 4-6 weeks after your baby is born). After this time you may choose to use a pacifier. Research has shown that pacifier use during the first year of a baby's life decreases the risk of sudden infant death syndrome (SIDS). Allow your baby to feed on each breast as long as he or she wants. Breastfeed until your baby is finished feeding. When your baby unlatches or falls asleep while feeding from the first breast, offer the second breast.  Because newborns are often sleepy in the   first few weeks of life, you may need to awaken your baby to get him or her to feed. Breastfeeding times will vary from baby to baby. However, the following rules can serve as a guide to help you ensure that your baby is properly fed:  Newborns (babies 4 weeks of age or younger) may breastfeed every 1-3 hours.  Newborns should not go longer than 3 hours during the day or 5 hours during the night without breastfeeding.  You should breastfeed your baby a minimum of 8 times in a 24-hour period until you begin to introduce solid foods to your baby at around 6 months of age. BREAST MILK PUMPING Pumping and storing breast milk allows you to ensure that your baby is exclusively fed your breast milk, even at times when you are unable to breastfeed. This is especially important if you are going back to work while you are still breastfeeding or when you are not able to be present during feedings. Your lactation consultant can give you guidelines on how long it is safe to store breast milk.  A breast pump is a machine that allows you to pump milk from your breast into a sterile bottle. The pumped breast milk can then be stored in a refrigerator or freezer. Some breast pumps are operated by hand, while others use electricity. Ask your lactation consultant which type will work best for you. Breast pumps can be purchased, but some hospitals and breastfeeding support groups lease breast pumps on a monthly basis. A lactation consultant can teach you how to hand express breast milk, if you prefer not to use a pump.  CARING FOR YOUR BREASTS WHILE YOU BREASTFEED Nipples can become dry, cracked, and sore while breastfeeding. The following recommendations can help keep your breasts moisturized and healthy:  Avoid using soap on your nipples.   Wear a supportive bra. Although not required, special nursing bras and tank tops are designed to allow access to your breasts for  breastfeeding without taking off your entire bra or top. Avoid wearing underwire-style bras or extremely tight bras.  Air dry your nipples for 3-4minutes after each feeding.   Use only cotton bra pads to absorb leaked breast milk. Leaking of breast milk between feedings is normal.   Use lanolin on your nipples after breastfeeding. Lanolin helps to maintain your skin's normal moisture barrier. If you use pure lanolin, you do not need to wash it off before feeding your baby again. Pure lanolin is not toxic to your baby. You may also hand express a few drops of breast milk and gently massage that milk into your nipples and allow the milk to air dry. In the first few weeks after giving birth, some women experience extremely full breasts (engorgement). Engorgement can make your breasts feel heavy, warm, and tender to the touch. Engorgement peaks within 3-5 days after you give birth. The following recommendations can help ease engorgement:  Completely empty your breasts while breastfeeding or pumping. You may want to start by applying warm, moist heat (in the shower or with warm water-soaked hand towels) just before feeding or pumping. This increases circulation and helps the milk flow. If your baby does not completely empty your breasts while breastfeeding, pump any extra milk after he or she is finished.  Wear a snug bra (nursing or regular) or tank top for 1-2 days to signal your body to slightly decrease milk production.  Apply ice packs to your breasts, unless this is too uncomfortable for you.    Make sure that your baby is latched on and positioned properly while breastfeeding. If engorgement persists after 48 hours of following these recommendations, contact your health care provider or a lactation consultant. OVERALL HEALTH CARE RECOMMENDATIONS WHILE BREASTFEEDING  Eat healthy foods. Alternate between meals and snacks, eating 3 of each per day. Because what you eat affects your breast milk,  some of the foods may make your baby more irritable than usual. Avoid eating these foods if you are sure that they are negatively affecting your baby.  Drink milk, fruit juice, and water to satisfy your thirst (about 10 glasses a day).   Rest often, relax, and continue to take your prenatal vitamins to prevent fatigue, stress, and anemia.  Continue breast self-awareness checks.  Avoid chewing and smoking tobacco.  Avoid alcohol and drug use. Some medicines that may be harmful to your baby can pass through breast milk. It is important to ask your health care provider before taking any medicine, including all over-the-counter and prescription medicine as well as vitamin and herbal supplements. It is possible to become pregnant while breastfeeding. If birth control is desired, ask your health care provider about options that will be safe for your baby. SEEK MEDICAL CARE IF:   You feel like you want to stop breastfeeding or have become frustrated with breastfeeding.  You have painful breasts or nipples.  Your nipples are cracked or bleeding.  Your breasts are red, tender, or warm.  You have a swollen area on either breast.  You have a fever or chills.  You have nausea or vomiting.  You have drainage other than breast milk from your nipples.  Your breasts do not become full before feedings by the fifth day after you give birth.  You feel sad and depressed.  Your baby is too sleepy to eat well.  Your baby is having trouble sleeping.   Your baby is wetting less than 3 diapers in a 24-hour period.  Your baby has less than 3 stools in a 24-hour period.  Your baby's skin or the white part of his or her eyes becomes yellow.   Your baby is not gaining weight by 5 days of age. SEEK IMMEDIATE MEDICAL CARE IF:   Your baby is overly tired (lethargic) and does not want to wake up and feed.  Your baby develops an unexplained fever. Document Released: 03/07/2005 Document Revised:  03/12/2013 Document Reviewed: 08/29/2012 [employer] Patient Information 2015 [employer]. This information is not intended to replace advice given to you by your health care provider. Make sure you discuss any questions you have with your health care provider.  

## 2014-05-30 NOTE — Progress Notes (Signed)
Doing well Less headaches Normal anatomy Reports itching--no palm or sole itching. Advised on moisterizer, benadryl, soap--if persists, may need bile acids checked.

## 2014-05-31 LAB — GLUCOSE TOLERANCE, 1 HOUR (50G) W/O FASTING: Glucose, 1 Hour GTT: 114 mg/dL (ref 70–140)

## 2014-06-04 ENCOUNTER — Ambulatory Visit (INDEPENDENT_AMBULATORY_CARE_PROVIDER_SITE_OTHER): Payer: BLUE CROSS/BLUE SHIELD | Admitting: Family Medicine

## 2014-06-04 VITALS — BP 100/60 | HR 76 | Wt 194.2 lb

## 2014-06-04 DIAGNOSIS — R21 Rash and other nonspecific skin eruption: Secondary | ICD-10-CM

## 2014-06-04 LAB — ALPHA FETOPROTEIN, MATERNAL
AFP: 39.6 ng/mL
CURR GEST AGE: 19.4 wks.days
MOM FOR AFP: 0.84
OPEN SPINA BIFIDA: NEGATIVE
Osb Risk: 1:23200 {titer}

## 2014-06-04 MED ORDER — PREDNISONE 20 MG PO TABS: 60.0000 mg | ORAL_TABLET | Freq: Every day | ORAL | Status: DC

## 2014-06-04 NOTE — Progress Notes (Signed)
History Reports itching, which was present at [facility] last week.  New onset rash that has been unresponsive to Zyrtec, and topical treatments.  Notes no new exposures. Rash is progressing to new parts including back, thighs, breasts, abdomen and arms.  Physical exam Filed Vitals:   06/04/14 0857  BP: 100/60  Pulse: 76   Gen: WD/WN female, NAD Skin: generalized eruption, maculopapular rash with erythematous base noted on lower abdomen, breasts, arms, lower back and thighs.  Assessment Rash and nonspecific skin eruption Likely allergic to something.  Plan Prednisone 60 mg x 5 days Referral to Derm as may need sensitivity testing to find culprit.

## 2014-06-04 NOTE — Progress Notes (Signed)
Itching is much worse.  Rash is on torso and extremities.

## 2014-06-27 ENCOUNTER — Ambulatory Visit (INDEPENDENT_AMBULATORY_CARE_PROVIDER_SITE_OTHER): Payer: BLUE CROSS/BLUE SHIELD | Admitting: Obstetrics and Gynecology

## 2014-06-27 ENCOUNTER — Encounter: Payer: Self-pay | Admitting: Obstetrics and Gynecology

## 2014-06-27 VITALS — BP 101/63 | HR 80 | Wt 194.0 lb

## 2014-06-27 DIAGNOSIS — Z3493 Encounter for supervision of normal pregnancy, unspecified, third trimester: Secondary | ICD-10-CM

## 2014-06-27 DIAGNOSIS — O3421 Maternal care for scar from previous cesarean delivery: Secondary | ICD-10-CM

## 2014-06-27 DIAGNOSIS — O219 Vomiting of pregnancy, unspecified: Secondary | ICD-10-CM

## 2014-06-27 DIAGNOSIS — Z8279 Family history of other congenital malformations, deformations and chromosomal abnormalities: Secondary | ICD-10-CM

## 2014-06-27 DIAGNOSIS — O34219 Maternal care for unspecified type scar from previous cesarean delivery: Secondary | ICD-10-CM

## 2014-06-27 MED ORDER — ONDANSETRON HCL 8 MG PO TABS: 8.0000 mg | ORAL_TABLET | Freq: Three times a day (TID) | ORAL | Status: DC | PRN

## 2014-06-27 NOTE — Progress Notes (Signed)
Patient is doing well and is without complaints. FM/PTL precautions reviewed. 1hr GCT, TDap and labs next visit Patient was seen by Dermatologist regarding rash. Was given triamcinolone cream to apply twice daily which helped but the rash returned. She was prescribed prednisone 20 mg every other day but was afraid to take it. Patient with persistent pruritic rash. Advise to take prednisone as prescribed and to continue with cream. If symptoms worsen, may need to check bilirubin level

## 2014-07-25 ENCOUNTER — Encounter: Payer: Self-pay | Admitting: Obstetrics & Gynecology

## 2014-07-25 ENCOUNTER — Ambulatory Visit (INDEPENDENT_AMBULATORY_CARE_PROVIDER_SITE_OTHER): Payer: BLUE CROSS/BLUE SHIELD | Admitting: Obstetrics & Gynecology

## 2014-07-25 VITALS — BP 106/70 | HR 84 | Wt 202.0 lb

## 2014-07-25 DIAGNOSIS — Z36 Encounter for antenatal screening of mother: Secondary | ICD-10-CM

## 2014-07-25 DIAGNOSIS — Z3492 Encounter for supervision of normal pregnancy, unspecified, second trimester: Secondary | ICD-10-CM

## 2014-07-25 DIAGNOSIS — Z23 Encounter for immunization: Secondary | ICD-10-CM

## 2014-07-25 LAB — CBC
HEMATOCRIT: 33.9 % — AB (ref 36.0–46.0)
HEMOGLOBIN: 11.3 g/dL — AB (ref 12.0–15.0)
MCH: 29.7 pg (ref 26.0–34.0)
MCHC: 33.3 g/dL (ref 30.0–36.0)
MCV: 89 fL (ref 78.0–100.0)
MPV: 9.8 fL (ref 8.6–12.4)
Platelets: 259 10*3/uL (ref 150–400)
RBC: 3.81 MIL/uL — ABNORMAL LOW (ref 3.87–5.11)
RDW: 14.9 % (ref 11.5–15.5)
WBC: 8 10*3/uL (ref 4.0–10.5)

## 2014-07-25 NOTE — Progress Notes (Signed)
Routine visit. Good FM. Rash is getting better but still itching. Routine 28 week labs today plus a CMET.

## 2014-07-26 LAB — COMPREHENSIVE METABOLIC PANEL
ALT: 14 U/L (ref 0–35)
AST: 14 U/L (ref 0–37)
Albumin: 3.3 g/dL — ABNORMAL LOW (ref 3.5–5.2)
Alkaline Phosphatase: 58 U/L (ref 39–117)
BILIRUBIN TOTAL: 0.3 mg/dL (ref 0.2–1.2)
BUN: 5 mg/dL — AB (ref 6–23)
CO2: 21 meq/L (ref 19–32)
CREATININE: 0.49 mg/dL — AB (ref 0.50–1.10)
Calcium: 8.3 mg/dL — ABNORMAL LOW (ref 8.4–10.5)
Chloride: 102 mEq/L (ref 96–112)
GLUCOSE: 92 mg/dL (ref 70–99)
Potassium: 3.9 mEq/L (ref 3.5–5.3)
Sodium: 138 mEq/L (ref 135–145)
TOTAL PROTEIN: 5.6 g/dL — AB (ref 6.0–8.3)

## 2014-07-26 LAB — HIV ANTIBODY (ROUTINE TESTING W REFLEX): HIV 1&2 Ab, 4th Generation: NONREACTIVE

## 2014-07-26 LAB — RPR

## 2014-07-26 LAB — GLUCOSE TOLERANCE, 1 HOUR (50G) W/O FASTING: Glucose, 1 Hour GTT: 149 mg/dL — ABNORMAL HIGH (ref 70–140)

## 2014-08-04 ENCOUNTER — Telehealth: Payer: Self-pay | Admitting: *Deleted

## 2014-08-04 NOTE — Telephone Encounter (Signed)
Advised patient of need for 3 hr testing.  She wishes to do it at her next follow up appointment this Friday.  She understands she needs to be fasting and will need to stay all morning.

## 2014-08-04 NOTE — Telephone Encounter (Signed)
-----   Message from Emily Filbert, MD sent at 08/04/2014  2:04 PM EDT ----- She will need a 3 hour GTT

## 2014-08-08 ENCOUNTER — Ambulatory Visit (INDEPENDENT_AMBULATORY_CARE_PROVIDER_SITE_OTHER): Payer: BLUE CROSS/BLUE SHIELD | Admitting: Family Medicine

## 2014-08-08 ENCOUNTER — Encounter: Payer: Self-pay | Admitting: Family Medicine

## 2014-08-08 VITALS — BP 103/63 | HR 88 | Wt 200.0 lb

## 2014-08-08 DIAGNOSIS — Z3483 Encounter for supervision of other normal pregnancy, third trimester: Secondary | ICD-10-CM

## 2014-08-08 DIAGNOSIS — Z3493 Encounter for supervision of normal pregnancy, unspecified, third trimester: Secondary | ICD-10-CM

## 2014-08-08 DIAGNOSIS — Z36 Encounter for antenatal screening of mother: Secondary | ICD-10-CM

## 2014-08-08 DIAGNOSIS — O3421 Maternal care for scar from previous cesarean delivery: Secondary | ICD-10-CM

## 2014-08-08 DIAGNOSIS — R7302 Impaired glucose tolerance (oral): Secondary | ICD-10-CM

## 2014-08-08 DIAGNOSIS — K429 Umbilical hernia without obstruction or gangrene: Secondary | ICD-10-CM

## 2014-08-08 DIAGNOSIS — O34219 Maternal care for unspecified type scar from previous cesarean delivery: Secondary | ICD-10-CM

## 2014-08-08 NOTE — Patient Instructions (Signed)
Breastfeeding Deciding to breastfeed is one of the best choices you can make for you and your baby. A change in hormones during pregnancy causes your breast tissue to grow and increases the number and size of your milk ducts. These hormones also allow proteins, sugars, and fats from your blood supply to make breast milk in your milk-producing glands. Hormones prevent breast milk from being released before your baby is born as well as prompt milk flow after birth. Once breastfeeding has begun, thoughts of your baby, as well as his or her sucking or crying, can stimulate the release of milk from your milk-producing glands.  BENEFITS OF BREASTFEEDING For Your Baby  Your first milk (colostrum) helps your baby's digestive system function better.   There are antibodies in your milk that help your baby fight off infections.   Your baby has a lower incidence of asthma, allergies, and sudden infant death syndrome.   The nutrients in breast milk are better for your baby than infant formulas and are designed uniquely for your baby's needs.   Breast milk improves your baby's brain development.   Your baby is less likely to develop other conditions, such as childhood obesity, asthma, or type 2 diabetes mellitus.  For You   Breastfeeding helps to create a very special bond between you and your baby.   Breastfeeding is convenient. Breast milk is always available at the correct temperature and costs nothing.   Breastfeeding helps to burn calories and helps you lose the weight gained during pregnancy.   Breastfeeding makes your uterus contract to its prepregnancy size faster and slows bleeding (lochia) after you give birth.   Breastfeeding helps to lower your risk of developing type 2 diabetes mellitus, osteoporosis, and breast or ovarian cancer later in life. SIGNS THAT YOUR BABY IS HUNGRY Early Signs of Hunger  Increased alertness or activity.  Stretching.  Movement of the head from  side to side.  Movement of the head and opening of the mouth when the corner of the mouth or cheek is stroked (rooting).  Increased sucking sounds, smacking lips, cooing, sighing, or squeaking.  Hand-to-mouth movements.  Increased sucking of fingers or hands. Late Signs of Hunger  Fussing.  Intermittent crying. Extreme Signs of Hunger Signs of extreme hunger will require calming and consoling before your baby will be able to breastfeed successfully. Do not wait for the following signs of extreme hunger to occur before you initiate breastfeeding:   Restlessness.  A loud, strong cry.   Screaming. BREASTFEEDING BASICS Breastfeeding Initiation  Find a comfortable place to sit or lie down, with your neck and back well supported.  Place a pillow or rolled up blanket under your baby to bring him or her to the level of your breast (if you are seated). Nursing pillows are specially designed to help support your arms and your baby while you breastfeed.  Make sure that your baby's abdomen is facing your abdomen.   Gently massage your breast. With your fingertips, massage from your chest wall toward your nipple in a circular motion. This encourages milk flow. You may need to continue this action during the feeding if your milk flows slowly.  Support your breast with 4 fingers underneath and your thumb above your nipple. Make sure your fingers are well away from your nipple and your baby's mouth.   Stroke your baby's lips gently with your finger or nipple.   When your baby's mouth is open wide enough, quickly bring your baby to your  breast, placing your entire nipple and as much of the colored area around your nipple (areola) as possible into your baby's mouth.   More areola should be visible above your baby's upper lip than below the lower lip.   Your baby's tongue should be between his or her lower gum and your breast.   Ensure that your baby's mouth is correctly positioned  around your nipple (latched). Your baby's lips should create a seal on your breast and be turned out (everted).  It is common for your baby to suck about 2-3 minutes in order to start the flow of breast milk. Latching Teaching your baby how to latch on to your breast properly is very important. An improper latch can cause nipple pain and decreased milk supply for you and poor weight gain in your baby. Also, if your baby is not latched onto your nipple properly, he or she may swallow some air during feeding. This can make your baby fussy. Burping your baby when you switch breasts during the feeding can help to get rid of the air. However, teaching your baby to latch on properly is still the best way to prevent fussiness from swallowing air while breastfeeding. Signs that your baby has successfully latched on to your nipple:    Silent tugging or silent sucking, without causing you pain.   Swallowing heard between every 3-4 sucks.    Muscle movement above and in front of his or her ears while sucking.  Signs that your baby has not successfully latched on to nipple:   Sucking sounds or smacking sounds from your baby while breastfeeding.  Nipple pain. If you think your baby has not latched on correctly, slip your finger into the corner of your baby's mouth to break the suction and place it between your baby's gums. Attempt breastfeeding initiation again. Signs of Successful Breastfeeding Signs from your baby:   A gradual decrease in the number of sucks or complete cessation of sucking.   Falling asleep.   Relaxation of his or her body.   Retention of a small amount of milk in his or her mouth.   Letting go of your breast by himself or herself. Signs from you:  Breasts that have increased in firmness, weight, and size 1-3 hours after feeding.   Breasts that are softer immediately after breastfeeding.  Increased milk volume, as well as a change in milk consistency and color by  the fifth day of breastfeeding.   Nipples that are not sore, cracked, or bleeding. Signs That Your [employer] is Getting Enough Milk  Wetting at least 3 diapers in a 24-hour period. The urine should be clear and pale yellow by age 11 days.  At least 3 stools in a 24-hour period by age 11 days. The stool should be soft and yellow.  At least 3 stools in a 24-hour period by age 18 days. The stool should be seedy and yellow.  No loss of weight greater than 10% of birth weight during the first 55 days of age.  Average weight gain of 4-7 ounces (113-198 g) per week after age 36 days.  Consistent daily weight gain by age 9 days, without weight loss after the age of 2 weeks. After a feeding, your baby may spit up a small amount. This is common. BREASTFEEDING FREQUENCY AND DURATION Frequent feeding will help you make more milk and can prevent sore nipples and breast engorgement. Breastfeed when you feel the need to reduce the fullness of your breasts  or when your baby shows signs of hunger. This is called "breastfeeding on demand." Avoid introducing a pacifier to your baby while you are working to establish breastfeeding (the first 4-6 weeks after your baby is born). After this time you may choose to use a pacifier. Research has shown that pacifier use during the first year of a baby's life decreases the risk of sudden infant death syndrome (SIDS). Allow your baby to feed on each breast as long as he or she wants. Breastfeed until your baby is finished feeding. When your baby unlatches or falls asleep while feeding from the first breast, offer the second breast. Because newborns are often sleepy in the first few weeks of life, you may need to awaken your baby to get him or her to feed. Breastfeeding times will vary from baby to baby. However, the following rules can serve as a guide to help you ensure that your baby is properly fed:  Newborns (babies 5 weeks of age or younger) may breastfeed every 1-3  hours.  Newborns should not go longer than 3 hours during the day or 5 hours during the night without breastfeeding.  You should breastfeed your baby a minimum of 8 times in a 24-hour period until you begin to introduce solid foods to your baby at around 54 months of age. BREAST MILK PUMPING Pumping and storing breast milk allows you to ensure that your baby is exclusively fed your breast milk, even at times when you are unable to breastfeed. This is especially important if you are going back to work while you are still breastfeeding or when you are not able to be present during feedings. Your lactation consultant can give you guidelines on how long it is safe to store breast milk.  A breast pump is a machine that allows you to pump milk from your breast into a sterile bottle. The pumped breast milk can then be stored in a refrigerator or freezer. Some breast pumps are operated by hand, while others use electricity. Ask your lactation consultant which type will work best for you. Breast pumps can be purchased, but some hospitals and breastfeeding support groups lease breast pumps on a monthly basis. A lactation consultant can teach you how to hand express breast milk, if you prefer not to use a pump.  CARING FOR YOUR BREASTS WHILE YOU BREASTFEED Nipples can become dry, cracked, and sore while breastfeeding. The following recommendations can help keep your breasts moisturized and healthy:  Avoid using soap on your nipples.   Wear a supportive bra. Although not required, special nursing bras and tank tops are designed to allow access to your breasts for breastfeeding without taking off your entire bra or top. Avoid wearing underwire-style bras or extremely tight bras.  Air dry your nipples for 3-98minutes after each feeding.   Use only cotton bra pads to absorb leaked breast milk. Leaking of breast milk between feedings is normal.   Use lanolin on your nipples after breastfeeding. Lanolin helps to  maintain your skin's normal moisture barrier. If you use pure lanolin, you do not need to wash it off before feeding your baby again. Pure lanolin is not toxic to your baby. You may also hand express a few drops of breast milk and gently massage that milk into your nipples and allow the milk to air dry. In the first few weeks after giving birth, some women experience extremely full breasts (engorgement). Engorgement can make your breasts feel heavy, warm, and tender to the  touch. Engorgement peaks within 3-5 days after you give birth. The following recommendations can help ease engorgement:  Completely empty your breasts while breastfeeding or pumping. You may want to start by applying warm, moist heat (in the shower or with warm water-soaked hand towels) just before feeding or pumping. This increases circulation and helps the milk flow. If your baby does not completely empty your breasts while breastfeeding, pump any extra milk after he or she is finished.  Wear a snug bra (nursing or regular) or tank top for 1-2 days to signal your body to slightly decrease milk production.  Apply ice packs to your breasts, unless this is too uncomfortable for you.  Make sure that your baby is latched on and positioned properly while breastfeeding. If engorgement persists after 48 hours of following these recommendations, contact your health care provider or a Science writer. OVERALL HEALTH CARE RECOMMENDATIONS WHILE BREASTFEEDING  Eat healthy foods. Alternate between meals and snacks, eating 3 of each per day. Because what you eat affects your breast milk, some of the foods may make your baby more irritable than usual. Avoid eating these foods if you are sure that they are negatively affecting your baby.  Drink milk, fruit juice, and water to satisfy your thirst (about 10 glasses a day).   Rest often, relax, and continue to take your prenatal vitamins to prevent fatigue, stress, and anemia.  Continue  breast self-awareness checks.  Avoid chewing and smoking tobacco.  Avoid alcohol and drug use. Some medicines that may be harmful to your baby can pass through breast milk. It is important to ask your health care provider before taking any medicine, including all over-the-counter and prescription medicine as well as vitamin and herbal supplements. It is possible to become pregnant while breastfeeding. If birth control is desired, ask your health care provider about options that will be safe for your baby. SEEK MEDICAL CARE IF:   You feel like you want to stop breastfeeding or have become frustrated with breastfeeding.  You have painful breasts or nipples.  Your nipples are cracked or bleeding.  Your breasts are red, tender, or warm.  You have a swollen area on either breast.  You have a fever or chills.  You have nausea or vomiting.  You have drainage other than breast milk from your nipples.  Your breasts do not become full before feedings by the fifth day after you give birth.  You feel sad and depressed.  Your baby is too sleepy to eat well.  Your baby is having trouble sleeping.   Your baby is wetting less than 3 diapers in a 24-hour period.  Your baby has less than 3 stools in a 24-hour period.  Your baby's skin or the white part of his or her eyes becomes yellow.   Your baby is not gaining weight by 60 days of age. SEEK IMMEDIATE MEDICAL CARE IF:   Your baby is overly tired (lethargic) and does not want to wake up and feed.  Your baby develops an unexplained fever. Document Released: 03/07/2005 Document Revised: 03/12/2013 Document Reviewed: 08/29/2012 [facility] Patient Information 2015 [place], [place]. This information is not intended to replace advice given to you by your health care provider. Make sure you discuss any questions you have with your health care provider.

## 2014-08-08 NOTE — Progress Notes (Signed)
Subjective:   Cindy Walker is a 29 y.o. G3P2001 [redacted]w[redacted]d being seen today for her obstetrical visit.  Patient reports no complaints. Reports firm knot at umbilicus. Has stopped itching.  Denies contractions, vaginal bleeding or leaking of fluid.  Reports good fetal movement.  The following portions of the patient's history were reviewed and updated as appropriate: allergies, current medications, past family history, past medical history, past social history, past surgical history and problem list.   Objective:  BP 103/63 mmHg  Pulse 88  Wt 200 lb (90.719 kg)  LMP 01/13/2014 (Exact Date)  FHT: Fetal Heart Rate (bpm): 145  Uterine Size:  29  Fetal Movement: Movement: Present    Abdomen:  soft, gravid, appropriate for gestational age,There is a moderate sized umbilical hernia which is soft   No results found for this or any previous visit (from the past 24 hour(s)).  Assessment and Plan:   Pregnancy:  G3P2001 at [redacted]w[redacted]d  1. Supervision of normal pregnancy, third trimester Abnormal 1 hour for 3 hour today  2. Previous cesarean delivery, antepartum Will book for Csection next visit    Preterm labor symptoms: vaginal bleeding, contractions and leaking of fluid reviewed in detail.  Fetal movement precautions reviewed.  Follow up in 2 weeks.  [name], MD

## 2014-08-09 LAB — GLUCOSE TOLERANCE, 3 HOURS
GLUCOSE 3 HOUR GTT: 124 mg/dL (ref 70–144)
GLUCOSE, FASTING-GESTATIONAL: 73 mg/dL (ref 70–104)
Glucose Tolerance, 1 hour: 145 mg/dL (ref 70–189)
Glucose Tolerance, 2 hour: 118 mg/dL (ref 70–164)

## 2014-08-25 ENCOUNTER — Ambulatory Visit (INDEPENDENT_AMBULATORY_CARE_PROVIDER_SITE_OTHER): Payer: BLUE CROSS/BLUE SHIELD | Admitting: Family Medicine

## 2014-08-25 VITALS — BP 100/66 | HR 80 | Wt 200.0 lb

## 2014-08-25 DIAGNOSIS — Z3493 Encounter for supervision of normal pregnancy, unspecified, third trimester: Secondary | ICD-10-CM

## 2014-08-25 DIAGNOSIS — O3421 Maternal care for scar from previous cesarean delivery: Secondary | ICD-10-CM

## 2014-08-25 DIAGNOSIS — O34219 Maternal care for unspecified type scar from previous cesarean delivery: Secondary | ICD-10-CM

## 2014-08-25 NOTE — Progress Notes (Signed)
Subjective:   Cindy Walker is a 29 y.o. G3P2001 at [redacted]w[redacted]d being seen today for her obstetrical visit.  Patient reports no complaints.   Contractions: Contractions: Not present.   Vaginal Bleeding Vag. Bleeding: None.   Fetal Movement: Movement: Present.   The following portions of the patient's history were reviewed and updated as appropriate: allergies, current medications, past family history, past medical history, past social history, past surgical history and problem list.   Objective:  BP 100/66 mmHg  Pulse 80  Wt 200 lb (90.719 kg)  LMP 01/13/2014 (Exact Date) Fetal Heart Rate: Fetal Heart Rate (bpm): 149  Fundal Height: Fundal Height: 33 cm  Fetal Movement: Movement: Present   Abdomen: Soft, gravid, appropriate for gestational age.  Pain/Pressure: Pain/Pressure: Present     Vaginal: Vaginal Bleeding Vag. Bleeding: None   Discharge: Vag D/C Character: Thin  Extremities: Edema: Edema: None   Urinalysis: Protein: Urine Protein: Negative Glucose: Urine Glucose: Negative   Assessment and Plan:   Pregnancy:  G3P2001 at [redacted]w[redacted]d  1. Supervision of normal pregnancy, third trimester Continue routine care  2. Previous cesarean delivery, antepartum Repeat C-section scheduled today   Preterm labor symptoms: vaginal bleeding, contractions and leaking of fluid reviewed in detail.  Fetal movement precautions reviewed.  Follow up in 2 weeks.   [name], MD

## 2014-08-25 NOTE — Patient Instructions (Signed)
Third Trimester of Pregnancy The third trimester is from week 29 through week 42, months 7 through 9. The third trimester is a time when the fetus is growing rapidly. At the end of the ninth month, the fetus is about 20 inches in length and weighs 6-10 pounds.  BODY CHANGES Your body goes through many changes during pregnancy. The changes vary from woman to woman.   Your weight will continue to increase. You can expect to gain 25-35 pounds (11-16 kg) by the end of the pregnancy.  You may begin to get stretch marks on your hips, abdomen, and breasts.  You may urinate more often because the fetus is moving lower into your pelvis and pressing on your bladder.  You may develop or continue to have heartburn as a result of your pregnancy.  You may develop constipation because certain hormones are causing the muscles that push waste through your intestines to slow down.  You may develop hemorrhoids or swollen, bulging veins (varicose veins).  You may have pelvic pain because of the weight gain and pregnancy hormones relaxing your joints between the bones in your pelvis. Backaches may result from overexertion of the muscles supporting your posture.  You may have changes in your hair. These can include thickening of your hair, rapid growth, and changes in texture. Some women also have hair loss during or after pregnancy, or hair that feels dry or thin. Your hair will most likely return to normal after your baby is born.  Your breasts will continue to grow and be tender. A yellow discharge may leak from your breasts called colostrum.  Your belly button may stick out.  You may feel short of breath because of your expanding uterus.  You may notice the fetus "dropping," or moving lower in your abdomen.  You may have a bloody mucus discharge. This usually occurs a few days to a week before labor begins.  Your cervix becomes thin and soft (effaced) near your due date. WHAT TO EXPECT AT YOUR  PRENATAL EXAMS  You will have prenatal exams every 2 weeks until week 36. Then, you will have weekly prenatal exams. During a routine prenatal visit:  You will be weighed to make sure you and the fetus are growing normally.  Your blood pressure is taken.  Your abdomen will be measured to track your baby's growth.  The fetal heartbeat will be listened to.  Any test results from the previous visit will be discussed.  You may have a cervical check near your due date to see if you have effaced. At around 36 weeks, your caregiver will check your cervix. At the same time, your caregiver will also perform a test on the secretions of the vaginal tissue. This test is to determine if a type of bacteria, Group B streptococcus, is present. Your caregiver will explain this further. Your caregiver may ask you:  What your birth plan is.  How you are feeling.  If you are feeling the baby move.  If you have had any abnormal symptoms, such as leaking fluid, bleeding, severe headaches, or abdominal cramping.  If you have any questions. Other tests or screenings that may be performed during your third trimester include:  Blood tests that check for low iron levels (anemia).  Fetal testing to check the health, activity level, and growth of the fetus. Testing is done if you have certain medical conditions or if there are problems during the pregnancy. FALSE LABOR You may feel small, irregular contractions that   eventually go away. These are called Braxton Hicks contractions, or false labor. Contractions may last for hours, days, or even weeks before true labor sets in. If contractions come at regular intervals, intensify, or become painful, it is best to be seen by your caregiver.  SIGNS OF LABOR   Menstrual-like cramps.  Contractions that are 5 minutes apart or less.  Contractions that start on the top of the uterus and spread down to the lower abdomen and back.  A sense of increased pelvic  pressure or back pain.  A watery or bloody mucus discharge that comes from the vagina. If you have any of these signs before the 37th week of pregnancy, call your caregiver right away. You need to go to the hospital to get checked immediately. HOME CARE INSTRUCTIONS   Avoid all smoking, herbs, alcohol, and unprescribed drugs. These chemicals affect the formation and growth of the baby.  Follow your caregiver's instructions regarding medicine use. There are medicines that are either safe or unsafe to take during pregnancy.  Exercise only as directed by your caregiver. Experiencing uterine cramps is a good sign to stop exercising.  Continue to eat regular, healthy meals.  Wear a good support bra for breast tenderness.  Do not use hot tubs, steam rooms, or saunas.  Wear your seat belt at all times when driving.  Avoid raw meat, uncooked cheese, cat litter boxes, and soil used by cats. These carry germs that can cause birth defects in the baby.  Take your prenatal vitamins.  Try taking a stool softener (if your caregiver approves) if you develop constipation. Eat more high-fiber foods, such as fresh vegetables or fruit and whole grains. Drink plenty of fluids to keep your urine clear or pale yellow.  Take warm sitz baths to soothe any pain or discomfort caused by hemorrhoids. Use hemorrhoid cream if your caregiver approves.  If you develop varicose veins, wear support hose. Elevate your feet for 15 minutes, 3-4 times a day. Limit salt in your diet.  Avoid heavy lifting, wear low heal shoes, and practice good posture.  Rest a lot with your legs elevated if you have leg cramps or low back pain.  Visit your dentist if you have not gone during your pregnancy. Use a soft toothbrush to brush your teeth and be gentle when you floss.  A sexual relationship may be continued unless your caregiver directs you otherwise.  Do not travel far distances unless it is absolutely necessary and only  with the approval of your caregiver.  Take prenatal classes to understand, practice, and ask questions about the labor and delivery.  Make a trial run to the hospital.  Pack your hospital bag.  Prepare the baby's nursery.  Continue to go to all your prenatal visits as directed by your caregiver. SEEK MEDICAL CARE IF:  You are unsure if you are in labor or if your water has broken.  You have dizziness.  You have mild pelvic cramps, pelvic pressure, or nagging pain in your abdominal area.  You have persistent nausea, vomiting, or diarrhea.  You have a bad smelling vaginal discharge.  You have pain with urination. SEEK IMMEDIATE MEDICAL CARE IF:   You have a fever.  You are leaking fluid from your vagina.  You have spotting or bleeding from your vagina.  You have severe abdominal cramping or pain.  You have rapid weight loss or gain.  You have shortness of breath with chest pain.  You notice sudden or extreme swelling   of your face, hands, ankles, feet, or legs.  You have not felt your baby move in over an hour.  You have severe headaches that do not go away with medicine.  You have vision changes. Document Released: 03/01/2001 Document Revised: 03/12/2013 Document Reviewed: 05/08/2012 [employer] Patient Information 2015 [employer]. This information is not intended to replace advice given to you by your health care provider. Make sure you discuss any questions you have with your health care provider.  Breastfeeding Deciding to breastfeed is one of the best choices you can make for you and your baby. A change in hormones during pregnancy causes your breast tissue to grow and increases the number and size of your milk ducts. These hormones also allow proteins, sugars, and fats from your blood supply to make breast milk in your milk-producing glands. Hormones prevent breast milk from being released before your baby is born as well as prompt milk flow after birth. Once  breastfeeding has begun, thoughts of your baby, as well as his or her sucking or crying, can stimulate the release of milk from your milk-producing glands.  BENEFITS OF BREASTFEEDING For Your Baby  Your first milk (colostrum) helps your baby's digestive system function better.   There are antibodies in your milk that help your baby fight off infections.   Your baby has a lower incidence of asthma, allergies, and sudden infant death syndrome.   The nutrients in breast milk are better for your baby than infant formulas and are designed uniquely for your baby's needs.   Breast milk improves your baby's brain development.   Your baby is less likely to develop other conditions, such as childhood obesity, asthma, or type 2 diabetes mellitus.  For You   Breastfeeding helps to create a very special bond between you and your baby.   Breastfeeding is convenient. Breast milk is always available at the correct temperature and costs nothing.   Breastfeeding helps to burn calories and helps you lose the weight gained during pregnancy.   Breastfeeding makes your uterus contract to its prepregnancy size faster and slows bleeding (lochia) after you give birth.   Breastfeeding helps to lower your risk of developing type 2 diabetes mellitus, osteoporosis, and breast or ovarian cancer later in life. SIGNS THAT YOUR BABY IS HUNGRY Early Signs of Hunger  Increased alertness or activity.  Stretching.  Movement of the head from side to side.  Movement of the head and opening of the mouth when the corner of the mouth or cheek is stroked (rooting).  Increased sucking sounds, smacking lips, cooing, sighing, or squeaking.  Hand-to-mouth movements.  Increased sucking of fingers or hands. Late Signs of Hunger  Fussing.  Intermittent crying. Extreme Signs of Hunger Signs of extreme hunger will require calming and consoling before your baby will be able to breastfeed successfully. Do not  wait for the following signs of extreme hunger to occur before you initiate breastfeeding:   Restlessness.  A loud, strong cry.   Screaming. BREASTFEEDING BASICS Breastfeeding Initiation  Find a comfortable place to sit or lie down, with your neck and back well supported.  Place a pillow or rolled up blanket under your baby to bring him or her to the level of your breast (if you are seated). Nursing pillows are specially designed to help support your arms and your baby while you breastfeed.  Make sure that your baby's abdomen is facing your abdomen.   Gently massage your breast. With your fingertips, massage from your chest   wall toward your nipple in a circular motion. This encourages milk flow. You may need to continue this action during the feeding if your milk flows slowly.  Support your breast with 4 fingers underneath and your thumb above your nipple. Make sure your fingers are well away from your nipple and your baby's mouth.   Stroke your baby's lips gently with your finger or nipple.   When your baby's mouth is open wide enough, quickly bring your baby to your breast, placing your entire nipple and as much of the colored area around your nipple (areola) as possible into your baby's mouth.   More areola should be visible above your baby's upper lip than below the lower lip.   Your baby's tongue should be between his or her lower gum and your breast.   Ensure that your baby's mouth is correctly positioned around your nipple (latched). Your baby's lips should create a seal on your breast and be turned out (everted).  It is common for your baby to suck about 2-3 minutes in order to start the flow of breast milk. Latching Teaching your baby how to latch on to your breast properly is very important. An improper latch can cause nipple pain and decreased milk supply for you and poor weight gain in your baby. Also, if your baby is not latched onto your nipple properly, he or she  may swallow some air during feeding. This can make your baby fussy. Burping your baby when you switch breasts during the feeding can help to get rid of the air. However, teaching your baby to latch on properly is still the best way to prevent fussiness from swallowing air while breastfeeding. Signs that your baby has successfully latched on to your nipple:    Silent tugging or silent sucking, without causing you pain.   Swallowing heard between every 3-4 sucks.    Muscle movement above and in front of his or her ears while sucking.  Signs that your baby has not successfully latched on to nipple:   Sucking sounds or smacking sounds from your baby while breastfeeding.  Nipple pain. If you think your baby has not latched on correctly, slip your finger into the corner of your baby's mouth to break the suction and place it between your baby's gums. Attempt breastfeeding initiation again. Signs of Successful Breastfeeding Signs from your baby:   A gradual decrease in the number of sucks or complete cessation of sucking.   Falling asleep.   Relaxation of his or her body.   Retention of a small amount of milk in his or her mouth.   Letting go of your breast by himself or herself. Signs from you:  Breasts that have increased in firmness, weight, and size 1-3 hours after feeding.   Breasts that are softer immediately after breastfeeding.  Increased milk volume, as well as a change in milk consistency and color by the fifth day of breastfeeding.   Nipples that are not sore, cracked, or bleeding. Signs That Your Baby is Getting Enough Milk  Wetting at least 3 diapers in a 24-hour period. The urine should be clear and pale yellow by age 5 days.  At least 3 stools in a 24-hour period by age 5 days. The stool should be soft and yellow.  At least 3 stools in a 24-hour period by age 7 days. The stool should be seedy and yellow.  No loss of weight greater than 10% of birth weight  during the first 3   days of age.  Average weight gain of 4-7 ounces (113-198 g) per week after age 4 days.  Consistent daily weight gain by age 5 days, without weight loss after the age of 2 weeks. After a feeding, your baby may spit up a small amount. This is common. BREASTFEEDING FREQUENCY AND DURATION Frequent feeding will help you make more milk and can prevent sore nipples and breast engorgement. Breastfeed when you feel the need to reduce the fullness of your breasts or when your baby shows signs of hunger. This is called "breastfeeding on demand." Avoid introducing a pacifier to your baby while you are working to establish breastfeeding (the first 4-6 weeks after your baby is born). After this time you may choose to use a pacifier. Research has shown that pacifier use during the first year of a baby's life decreases the risk of sudden infant death syndrome (SIDS). Allow your baby to feed on each breast as long as he or she wants. Breastfeed until your baby is finished feeding. When your baby unlatches or falls asleep while feeding from the first breast, offer the second breast. Because newborns are often sleepy in the first few weeks of life, you may need to awaken your baby to get him or her to feed. Breastfeeding times will vary from baby to baby. However, the following rules can serve as a guide to help you ensure that your baby is properly fed:  Newborns (babies 4 weeks of age or younger) may breastfeed every 1-3 hours.  Newborns should not go longer than 3 hours during the day or 5 hours during the night without breastfeeding.  You should breastfeed your baby a minimum of 8 times in a 24-hour period until you begin to introduce solid foods to your baby at around 6 months of age. BREAST MILK PUMPING Pumping and storing breast milk allows you to ensure that your baby is exclusively fed your breast milk, even at times when you are unable to breastfeed. This is especially important if you are  going back to work while you are still breastfeeding or when you are not able to be present during feedings. Your lactation consultant can give you guidelines on how long it is safe to store breast milk.  A breast pump is a machine that allows you to pump milk from your breast into a sterile bottle. The pumped breast milk can then be stored in a refrigerator or freezer. Some breast pumps are operated by hand, while others use electricity. Ask your lactation consultant which type will work best for you. Breast pumps can be purchased, but some hospitals and breastfeeding support groups lease breast pumps on a monthly basis. A lactation consultant can teach you how to hand express breast milk, if you prefer not to use a pump.  CARING FOR YOUR BREASTS WHILE YOU BREASTFEED Nipples can become dry, cracked, and sore while breastfeeding. The following recommendations can help keep your breasts moisturized and healthy:  Avoid using soap on your nipples.   Wear a supportive bra. Although not required, special nursing bras and tank tops are designed to allow access to your breasts for breastfeeding without taking off your entire bra or top. Avoid wearing underwire-style bras or extremely tight bras.  Air dry your nipples for 3-4minutes after each feeding.   Use only cotton bra pads to absorb leaked breast milk. Leaking of breast milk between feedings is normal.   Use lanolin on your nipples after breastfeeding. Lanolin helps to maintain your skin's   normal moisture barrier. If you use pure lanolin, you do not need to wash it off before feeding your baby again. Pure lanolin is not toxic to your baby. You may also hand express a few drops of breast milk and gently massage that milk into your nipples and allow the milk to air dry. In the first few weeks after giving birth, some women experience extremely full breasts (engorgement). Engorgement can make your breasts feel heavy, warm, and tender to the touch.  Engorgement peaks within 3-5 days after you give birth. The following recommendations can help ease engorgement:  Completely empty your breasts while breastfeeding or pumping. You may want to start by applying warm, moist heat (in the shower or with warm water-soaked hand towels) just before feeding or pumping. This increases circulation and helps the milk flow. If your baby does not completely empty your breasts while breastfeeding, pump any extra milk after he or she is finished.  Wear a snug bra (nursing or regular) or tank top for 1-2 days to signal your body to slightly decrease milk production.  Apply ice packs to your breasts, unless this is too uncomfortable for you.  Make sure that your baby is latched on and positioned properly while breastfeeding. If engorgement persists after 48 hours of following these recommendations, contact your health care provider or a lactation consultant. OVERALL HEALTH CARE RECOMMENDATIONS WHILE BREASTFEEDING  Eat healthy foods. Alternate between meals and snacks, eating 3 of each per day. Because what you eat affects your breast milk, some of the foods may make your baby more irritable than usual. Avoid eating these foods if you are sure that they are negatively affecting your baby.  Drink milk, fruit juice, and water to satisfy your thirst (about 10 glasses a day).   Rest often, relax, and continue to take your prenatal vitamins to prevent fatigue, stress, and anemia.  Continue breast self-awareness checks.  Avoid chewing and smoking tobacco.  Avoid alcohol and drug use. Some medicines that may be harmful to your baby can pass through breast milk. It is important to ask your health care provider before taking any medicine, including all over-the-counter and prescription medicine as well as vitamin and herbal supplements. It is possible to become pregnant while breastfeeding. If birth control is desired, ask your health care provider about options that  will be safe for your baby. SEEK MEDICAL CARE IF:   You feel like you want to stop breastfeeding or have become frustrated with breastfeeding.  You have painful breasts or nipples.  Your nipples are cracked or bleeding.  Your breasts are red, tender, or warm.  You have a swollen area on either breast.  You have a fever or chills.  You have nausea or vomiting.  You have drainage other than breast milk from your nipples.  Your breasts do not become full before feedings by the fifth day after you give birth.  You feel sad and depressed.  Your baby is too sleepy to eat well.  Your baby is having trouble sleeping.   Your baby is wetting less than 3 diapers in a 24-hour period.  Your baby has less than 3 stools in a 24-hour period.  Your baby's skin or the white part of his or her eyes becomes yellow.   Your baby is not gaining weight by 5 days of age. SEEK IMMEDIATE MEDICAL CARE IF:   Your baby is overly tired (lethargic) and does not want to wake up and feed.  Your baby   develops an unexplained fever. Document Released: 03/07/2005 Document Revised: 03/12/2013 Document Reviewed: 08/29/2012 [employer] Patient Information 2015 [employer]. This information is not intended to replace advice given to you by your health care provider. Make sure you discuss any questions you have with your health care provider.  

## 2014-08-27 ENCOUNTER — Encounter (HOSPITAL_COMMUNITY): Payer: Self-pay | Admitting: *Deleted

## 2014-09-05 ENCOUNTER — Encounter: Payer: Self-pay | Admitting: Obstetrics and Gynecology

## 2014-09-05 ENCOUNTER — Ambulatory Visit (INDEPENDENT_AMBULATORY_CARE_PROVIDER_SITE_OTHER): Payer: BLUE CROSS/BLUE SHIELD | Admitting: Obstetrics and Gynecology

## 2014-09-05 VITALS — BP 100/66 | HR 89 | Wt 195.0 lb

## 2014-09-05 DIAGNOSIS — O34219 Maternal care for unspecified type scar from previous cesarean delivery: Secondary | ICD-10-CM

## 2014-09-05 DIAGNOSIS — O3421 Maternal care for scar from previous cesarean delivery: Secondary | ICD-10-CM

## 2014-09-05 DIAGNOSIS — Z8279 Family history of other congenital malformations, deformations and chromosomal abnormalities: Secondary | ICD-10-CM

## 2014-09-05 DIAGNOSIS — Z3493 Encounter for supervision of normal pregnancy, unspecified, third trimester: Secondary | ICD-10-CM

## 2014-09-05 NOTE — Progress Notes (Signed)
Subjective:  Cindy Walker is a 29 y.o. G3P2001 at [redacted]w[redacted]d being seen today for ongoing prenatal care.  Patient reports no complaints.  Contractions: Not present.  Vag. Bleeding: None. Movement: Present. Denies leaking of fluid.   The following portions of the patient's history were reviewed and updated as appropriate: allergies, current medications, past family history, past medical history, past social history, past surgical history and problem list.   Objective:   Filed Vitals:   09/05/14 1005  BP: 100/66  Pulse: 89  Weight: 195 lb (88.451 kg)    Fetal Status: Fetal Heart Rate (bpm): 140   Movement: Present     General:  Alert, oriented and cooperative. Patient is in no acute distress.  Skin: Skin is warm and dry. No rash noted.   Cardiovascular: Normal heart rate noted  Respiratory: Effort and breath sounds normal, no problems with respiration noted  Abdomen: Soft, gravid, appropriate for gestational age. Pain/Pressure: Absent     Vaginal: Vag. Bleeding: None.    Vag D/C Character: Thin  Cervix: Not evaluated  Extremities: Normal range of motion.  Edema: None  Mental Status: Normal mood and affect. Normal behavior. Normal judgment and thought content.   Urinalysis: Urine Protein: Negative Urine Glucose: Negative  Assessment and Plan:  Pregnancy: G3P2001 at [redacted]w[redacted]d  1. Supervision of normal pregnancy, third trimester Patient doing well without complaints  2. Previous cesarean delivery, antepartum Scheduled for repeat on 10/13/2014  3. Family history of cleft palate with cleft lip    Preterm labor symptoms and general obstetric precautions including but not limited to vaginal bleeding, contractions, leaking of fluid and fetal movement were reviewed in detail with the patient.  Please refer to After Visit Summary for other counseling recommendations.   Return in about 2 weeks (around 09/19/2014).   Mora Bellman, MD

## 2014-09-19 ENCOUNTER — Ambulatory Visit (INDEPENDENT_AMBULATORY_CARE_PROVIDER_SITE_OTHER): Payer: BLUE CROSS/BLUE SHIELD | Admitting: Family Medicine

## 2014-09-19 VITALS — BP 105/67 | HR 88 | Wt 195.0 lb

## 2014-09-19 DIAGNOSIS — O219 Vomiting of pregnancy, unspecified: Secondary | ICD-10-CM | POA: Diagnosis not present

## 2014-09-19 DIAGNOSIS — Z3483 Encounter for supervision of other normal pregnancy, third trimester: Secondary | ICD-10-CM

## 2014-09-19 DIAGNOSIS — O34219 Maternal care for unspecified type scar from previous cesarean delivery: Secondary | ICD-10-CM

## 2014-09-19 DIAGNOSIS — O3421 Maternal care for scar from previous cesarean delivery: Secondary | ICD-10-CM

## 2014-09-19 DIAGNOSIS — Z3493 Encounter for supervision of normal pregnancy, unspecified, third trimester: Secondary | ICD-10-CM

## 2014-09-19 MED ORDER — ONDANSETRON HCL 8 MG PO TABS: 8.0000 mg | ORAL_TABLET | Freq: Three times a day (TID) | ORAL | Status: DC | PRN

## 2014-09-19 NOTE — Progress Notes (Signed)
Subjective:  Cindy Walker is a 29 y.o. G3P2001 at [redacted]w[redacted]d being seen today for ongoing prenatal care.  Patient reports nausea and vomiting.  Contractions: Not present.  Vag. Bleeding: None. Movement: Present. Denies leaking of fluid.   The following portions of the patient's history were reviewed and updated as appropriate: allergies, current medications, past family history, past medical history, past social history, past surgical history and problem list.   Objective:   Filed Vitals:   09/19/14 1025  BP: 105/67  Pulse: 88  Weight: 195 lb (88.451 kg)    Fetal Status: Fetal Heart Rate (bpm): 160 Fundal Height: 37 cm Movement: Present     General:  Alert, oriented and cooperative. Patient is in no acute distress.  Skin: Skin is warm and dry. No rash noted.   Cardiovascular: Normal heart rate noted  Respiratory: Normal respiratory effort, no problems with respiration noted  Abdomen: Soft, gravid, appropriate for gestational age. Pain/Pressure: Absent     Vaginal: Vag. Bleeding: None.    Vag D/C Character: Thin  Cervix: Not evaluated        Extremities: Normal range of motion.  Edema: None  Mental Status: Normal mood and affect. Normal behavior. Normal judgment and thought content.   Urinalysis: Urine Protein: Negative Urine Glucose: Negative  Assessment and Plan:  Pregnancy: G3P2001 at [redacted]w[redacted]d  1. Supervision of normal pregnancy, third trimester  - GC/chlamydia probe amp, urine  2. Previous cesarean delivery, antepartum  - Repeat scheduled  3. Nausea and vomiting of pregnancy, antepartum  - ondansetron (ZOFRAN) 8 MG tablet; Take 1 tablet (8 mg total) by mouth every 8 (eight) hours as needed for nausea or vomiting.  Dispense: 30 tablet; Refill: 2   Preterm labor symptoms and general obstetric precautions including but not limited to vaginal bleeding, contractions, leaking of fluid and fetal movement were reviewed in detail with the patient.  Please refer to After Visit  Summary for other counseling recommendations.   Return in 1 week (on 09/26/2014).   [name], MD

## 2014-09-19 NOTE — Patient Instructions (Signed)
Third Trimester of Pregnancy The third trimester is from week 29 through week 42, months 7 through 9. The third trimester is a time when the fetus is growing rapidly. At the end of the ninth month, the fetus is about 20 inches in length and weighs 6-10 pounds.  BODY CHANGES Your body goes through many changes during pregnancy. The changes vary from woman to woman.   Your weight will continue to increase. You can expect to gain 25-35 pounds (11-16 kg) by the end of the pregnancy.  You may begin to get stretch marks on your hips, abdomen, and breasts.  You may urinate more often because the fetus is moving lower into your pelvis and pressing on your bladder.  You may develop or continue to have heartburn as a result of your pregnancy.  You may develop constipation because certain hormones are causing the muscles that push waste through your intestines to slow down.  You may develop hemorrhoids or swollen, bulging veins (varicose veins).  You may have pelvic pain because of the weight gain and pregnancy hormones relaxing your joints between the bones in your pelvis. Backaches may result from overexertion of the muscles supporting your posture.  You may have changes in your hair. These can include thickening of your hair, rapid growth, and changes in texture. Some women also have hair loss during or after pregnancy, or hair that feels dry or thin. Your hair will most likely return to normal after your baby is born.  Your breasts will continue to grow and be tender. A yellow discharge may leak from your breasts called colostrum.  Your belly button may stick out.  You may feel short of breath because of your expanding uterus.  You may notice the fetus "dropping," or moving lower in your abdomen.  You may have a bloody mucus discharge. This usually occurs a few days to a week before labor begins.  Your cervix becomes thin and soft (effaced) near your due date. WHAT TO EXPECT AT YOUR  PRENATAL EXAMS  You will have prenatal exams every 2 weeks until week 36. Then, you will have weekly prenatal exams. During a routine prenatal visit:  You will be weighed to make sure you and the fetus are growing normally.  Your blood pressure is taken.  Your abdomen will be measured to track your baby's growth.  The fetal heartbeat will be listened to.  Any test results from the previous visit will be discussed.  You may have a cervical check near your due date to see if you have effaced. At around 36 weeks, your caregiver will check your cervix. At the same time, your caregiver will also perform a test on the secretions of the vaginal tissue. This test is to determine if a type of bacteria, Group B streptococcus, is present. Your caregiver will explain this further. Your caregiver may ask you:  What your birth plan is.  How you are feeling.  If you are feeling the baby move.  If you have had any abnormal symptoms, such as leaking fluid, bleeding, severe headaches, or abdominal cramping.  If you have any questions. Other tests or screenings that may be performed during your third trimester include:  Blood tests that check for low iron levels (anemia).  Fetal testing to check the health, activity level, and growth of the fetus. Testing is done if you have certain medical conditions or if there are problems during the pregnancy. FALSE LABOR You may feel small, irregular contractions that   eventually go away. These are called Braxton Hicks contractions, or false labor. Contractions may last for hours, days, or even weeks before true labor sets in. If contractions come at regular intervals, intensify, or become painful, it is best to be seen by your caregiver.  SIGNS OF LABOR   Menstrual-like cramps.  Contractions that are 5 minutes apart or less.  Contractions that start on the top of the uterus and spread down to the lower abdomen and back.  A sense of increased pelvic  pressure or back pain.  A watery or bloody mucus discharge that comes from the vagina. If you have any of these signs before the 37th week of pregnancy, call your caregiver right away. You need to go to the hospital to get checked immediately. HOME CARE INSTRUCTIONS   Avoid all smoking, herbs, alcohol, and unprescribed drugs. These chemicals affect the formation and growth of the baby.  Follow your caregiver's instructions regarding medicine use. There are medicines that are either safe or unsafe to take during pregnancy.  Exercise only as directed by your caregiver. Experiencing uterine cramps is a good sign to stop exercising.  Continue to eat regular, healthy meals.  Wear a good support bra for breast tenderness.  Do not use hot tubs, steam rooms, or saunas.  Wear your seat belt at all times when driving.  Avoid raw meat, uncooked cheese, cat litter boxes, and soil used by cats. These carry germs that can cause birth defects in the baby.  Take your prenatal vitamins.  Try taking a stool softener (if your caregiver approves) if you develop constipation. Eat more high-fiber foods, such as fresh vegetables or fruit and whole grains. Drink plenty of fluids to keep your urine clear or pale yellow.  Take warm sitz baths to soothe any pain or discomfort caused by hemorrhoids. Use hemorrhoid cream if your caregiver approves.  If you develop varicose veins, wear support hose. Elevate your feet for 15 minutes, 3-4 times a day. Limit salt in your diet.  Avoid heavy lifting, wear low heal shoes, and practice good posture.  Rest a lot with your legs elevated if you have leg cramps or low back pain.  Visit your dentist if you have not gone during your pregnancy. Use a soft toothbrush to brush your teeth and be gentle when you floss.  A sexual relationship may be continued unless your caregiver directs you otherwise.  Do not travel far distances unless it is absolutely necessary and only  with the approval of your caregiver.  Take prenatal classes to understand, practice, and ask questions about the labor and delivery.  Make a trial run to the hospital.  Pack your hospital bag.  Prepare the baby's nursery.  Continue to go to all your prenatal visits as directed by your caregiver. SEEK MEDICAL CARE IF:  You are unsure if you are in labor or if your water has broken.  You have dizziness.  You have mild pelvic cramps, pelvic pressure, or nagging pain in your abdominal area.  You have persistent nausea, vomiting, or diarrhea.  You have a bad smelling vaginal discharge.  You have pain with urination. SEEK IMMEDIATE MEDICAL CARE IF:   You have a fever.  You are leaking fluid from your vagina.  You have spotting or bleeding from your vagina.  You have severe abdominal cramping or pain.  You have rapid weight loss or gain.  You have shortness of breath with chest pain.  You notice sudden or extreme swelling   of your face, hands, ankles, feet, or legs.  You have not felt your baby move in over an hour.  You have severe headaches that do not go away with medicine.  You have vision changes. Document Released: 03/01/2001 Document Revised: 03/12/2013 Document Reviewed: 05/08/2012 [employer] Patient Information 2015 [employer]. This information is not intended to replace advice given to you by your health care provider. Make sure you discuss any questions you have with your health care provider.  Breastfeeding Deciding to breastfeed is one of the best choices you can make for you and your baby. A change in hormones during pregnancy causes your breast tissue to grow and increases the number and size of your milk ducts. These hormones also allow proteins, sugars, and fats from your blood supply to make breast milk in your milk-producing glands. Hormones prevent breast milk from being released before your baby is born as well as prompt milk flow after birth. Once  breastfeeding has begun, thoughts of your baby, as well as his or her sucking or crying, can stimulate the release of milk from your milk-producing glands.  BENEFITS OF BREASTFEEDING For Your Baby  Your first milk (colostrum) helps your baby's digestive system function better.   There are antibodies in your milk that help your baby fight off infections.   Your baby has a lower incidence of asthma, allergies, and sudden infant death syndrome.   The nutrients in breast milk are better for your baby than infant formulas and are designed uniquely for your baby's needs.   Breast milk improves your baby's brain development.   Your baby is less likely to develop other conditions, such as childhood obesity, asthma, or type 2 diabetes mellitus.  For You   Breastfeeding helps to create a very special bond between you and your baby.   Breastfeeding is convenient. Breast milk is always available at the correct temperature and costs nothing.   Breastfeeding helps to burn calories and helps you lose the weight gained during pregnancy.   Breastfeeding makes your uterus contract to its prepregnancy size faster and slows bleeding (lochia) after you give birth.   Breastfeeding helps to lower your risk of developing type 2 diabetes mellitus, osteoporosis, and breast or ovarian cancer later in life. SIGNS THAT YOUR BABY IS HUNGRY Early Signs of Hunger  Increased alertness or activity.  Stretching.  Movement of the head from side to side.  Movement of the head and opening of the mouth when the corner of the mouth or cheek is stroked (rooting).  Increased sucking sounds, smacking lips, cooing, sighing, or squeaking.  Hand-to-mouth movements.  Increased sucking of fingers or hands. Late Signs of Hunger  Fussing.  Intermittent crying. Extreme Signs of Hunger Signs of extreme hunger will require calming and consoling before your baby will be able to breastfeed successfully. Do not  wait for the following signs of extreme hunger to occur before you initiate breastfeeding:   Restlessness.  A loud, strong cry.   Screaming. BREASTFEEDING BASICS Breastfeeding Initiation  Find a comfortable place to sit or lie down, with your neck and back well supported.  Place a pillow or rolled up blanket under your baby to bring him or her to the level of your breast (if you are seated). Nursing pillows are specially designed to help support your arms and your baby while you breastfeed.  Make sure that your baby's abdomen is facing your abdomen.   Gently massage your breast. With your fingertips, massage from your chest   wall toward your nipple in a circular motion. This encourages milk flow. You may need to continue this action during the feeding if your milk flows slowly.  Support your breast with 4 fingers underneath and your thumb above your nipple. Make sure your fingers are well away from your nipple and your baby's mouth.   Stroke your baby's lips gently with your finger or nipple.   When your baby's mouth is open wide enough, quickly bring your baby to your breast, placing your entire nipple and as much of the colored area around your nipple (areola) as possible into your baby's mouth.   More areola should be visible above your baby's upper lip than below the lower lip.   Your baby's tongue should be between his or her lower gum and your breast.   Ensure that your baby's mouth is correctly positioned around your nipple (latched). Your baby's lips should create a seal on your breast and be turned out (everted).  It is common for your baby to suck about 2-3 minutes in order to start the flow of breast milk. Latching Teaching your baby how to latch on to your breast properly is very important. An improper latch can cause nipple pain and decreased milk supply for you and poor weight gain in your baby. Also, if your baby is not latched onto your nipple properly, he or she  may swallow some air during feeding. This can make your baby fussy. Burping your baby when you switch breasts during the feeding can help to get rid of the air. However, teaching your baby to latch on properly is still the best way to prevent fussiness from swallowing air while breastfeeding. Signs that your baby has successfully latched on to your nipple:    Silent tugging or silent sucking, without causing you pain.   Swallowing heard between every 3-4 sucks.    Muscle movement above and in front of his or her ears while sucking.  Signs that your baby has not successfully latched on to nipple:   Sucking sounds or smacking sounds from your baby while breastfeeding.  Nipple pain. If you think your baby has not latched on correctly, slip your finger into the corner of your baby's mouth to break the suction and place it between your baby's gums. Attempt breastfeeding initiation again. Signs of Successful Breastfeeding Signs from your baby:   A gradual decrease in the number of sucks or complete cessation of sucking.   Falling asleep.   Relaxation of his or her body.   Retention of a small amount of milk in his or her mouth.   Letting go of your breast by himself or herself. Signs from you:  Breasts that have increased in firmness, weight, and size 1-3 hours after feeding.   Breasts that are softer immediately after breastfeeding.  Increased milk volume, as well as a change in milk consistency and color by the fifth day of breastfeeding.   Nipples that are not sore, cracked, or bleeding. Signs That Your Baby is Getting Enough Milk  Wetting at least 3 diapers in a 24-hour period. The urine should be clear and pale yellow by age 5 days.  At least 3 stools in a 24-hour period by age 5 days. The stool should be soft and yellow.  At least 3 stools in a 24-hour period by age 7 days. The stool should be seedy and yellow.  No loss of weight greater than 10% of birth weight  during the first 3   days of age.  Average weight gain of 4-7 ounces (113-198 g) per week after age 4 days.  Consistent daily weight gain by age 5 days, without weight loss after the age of 2 weeks. After a feeding, your baby may spit up a small amount. This is common. BREASTFEEDING FREQUENCY AND DURATION Frequent feeding will help you make more milk and can prevent sore nipples and breast engorgement. Breastfeed when you feel the need to reduce the fullness of your breasts or when your baby shows signs of hunger. This is called "breastfeeding on demand." Avoid introducing a pacifier to your baby while you are working to establish breastfeeding (the first 4-6 weeks after your baby is born). After this time you may choose to use a pacifier. Research has shown that pacifier use during the first year of a baby's life decreases the risk of sudden infant death syndrome (SIDS). Allow your baby to feed on each breast as long as he or she wants. Breastfeed until your baby is finished feeding. When your baby unlatches or falls asleep while feeding from the first breast, offer the second breast. Because newborns are often sleepy in the first few weeks of life, you may need to awaken your baby to get him or her to feed. Breastfeeding times will vary from baby to baby. However, the following rules can serve as a guide to help you ensure that your baby is properly fed:  Newborns (babies 4 weeks of age or younger) may breastfeed every 1-3 hours.  Newborns should not go longer than 3 hours during the day or 5 hours during the night without breastfeeding.  You should breastfeed your baby a minimum of 8 times in a 24-hour period until you begin to introduce solid foods to your baby at around 6 months of age. BREAST MILK PUMPING Pumping and storing breast milk allows you to ensure that your baby is exclusively fed your breast milk, even at times when you are unable to breastfeed. This is especially important if you are  going back to work while you are still breastfeeding or when you are not able to be present during feedings. Your lactation consultant can give you guidelines on how long it is safe to store breast milk.  A breast pump is a machine that allows you to pump milk from your breast into a sterile bottle. The pumped breast milk can then be stored in a refrigerator or freezer. Some breast pumps are operated by hand, while others use electricity. Ask your lactation consultant which type will work best for you. Breast pumps can be purchased, but some hospitals and breastfeeding support groups lease breast pumps on a monthly basis. A lactation consultant can teach you how to hand express breast milk, if you prefer not to use a pump.  CARING FOR YOUR BREASTS WHILE YOU BREASTFEED Nipples can become dry, cracked, and sore while breastfeeding. The following recommendations can help keep your breasts moisturized and healthy:  Avoid using soap on your nipples.   Wear a supportive bra. Although not required, special nursing bras and tank tops are designed to allow access to your breasts for breastfeeding without taking off your entire bra or top. Avoid wearing underwire-style bras or extremely tight bras.  Air dry your nipples for 3-4minutes after each feeding.   Use only cotton bra pads to absorb leaked breast milk. Leaking of breast milk between feedings is normal.   Use lanolin on your nipples after breastfeeding. Lanolin helps to maintain your skin's   normal moisture barrier. If you use pure lanolin, you do not need to wash it off before feeding your baby again. Pure lanolin is not toxic to your baby. You may also hand express a few drops of breast milk and gently massage that milk into your nipples and allow the milk to air dry. In the first few weeks after giving birth, some women experience extremely full breasts (engorgement). Engorgement can make your breasts feel heavy, warm, and tender to the touch.  Engorgement peaks within 3-5 days after you give birth. The following recommendations can help ease engorgement:  Completely empty your breasts while breastfeeding or pumping. You may want to start by applying warm, moist heat (in the shower or with warm water-soaked hand towels) just before feeding or pumping. This increases circulation and helps the milk flow. If your baby does not completely empty your breasts while breastfeeding, pump any extra milk after he or she is finished.  Wear a snug bra (nursing or regular) or tank top for 1-2 days to signal your body to slightly decrease milk production.  Apply ice packs to your breasts, unless this is too uncomfortable for you.  Make sure that your baby is latched on and positioned properly while breastfeeding. If engorgement persists after 48 hours of following these recommendations, contact your health care provider or a lactation consultant. OVERALL HEALTH CARE RECOMMENDATIONS WHILE BREASTFEEDING  Eat healthy foods. Alternate between meals and snacks, eating 3 of each per day. Because what you eat affects your breast milk, some of the foods may make your baby more irritable than usual. Avoid eating these foods if you are sure that they are negatively affecting your baby.  Drink milk, fruit juice, and water to satisfy your thirst (about 10 glasses a day).   Rest often, relax, and continue to take your prenatal vitamins to prevent fatigue, stress, and anemia.  Continue breast self-awareness checks.  Avoid chewing and smoking tobacco.  Avoid alcohol and drug use. Some medicines that may be harmful to your baby can pass through breast milk. It is important to ask your health care provider before taking any medicine, including all over-the-counter and prescription medicine as well as vitamin and herbal supplements. It is possible to become pregnant while breastfeeding. If birth control is desired, ask your health care provider about options that  will be safe for your baby. SEEK MEDICAL CARE IF:   You feel like you want to stop breastfeeding or have become frustrated with breastfeeding.  You have painful breasts or nipples.  Your nipples are cracked or bleeding.  Your breasts are red, tender, or warm.  You have a swollen area on either breast.  You have a fever or chills.  You have nausea or vomiting.  You have drainage other than breast milk from your nipples.  Your breasts do not become full before feedings by the fifth day after you give birth.  You feel sad and depressed.  Your baby is too sleepy to eat well.  Your baby is having trouble sleeping.   Your baby is wetting less than 3 diapers in a 24-hour period.  Your baby has less than 3 stools in a 24-hour period.  Your baby's skin or the white part of his or her eyes becomes yellow.   Your baby is not gaining weight by 5 days of age. SEEK IMMEDIATE MEDICAL CARE IF:   Your baby is overly tired (lethargic) and does not want to wake up and feed.  Your baby   develops an unexplained fever. Document Released: 03/07/2005 Document Revised: 03/12/2013 Document Reviewed: 08/29/2012 [employer] Patient Information 2015 [employer]. This information is not intended to replace advice given to you by your health care provider. Make sure you discuss any questions you have with your health care provider.  

## 2014-09-24 LAB — GC/CHLAMYDIA PROBE AMP, URINE
Chlamydia, Swab/Urine, PCR: NEGATIVE
GC PROBE AMP, URINE: NEGATIVE

## 2014-09-29 ENCOUNTER — Encounter: Payer: BLUE CROSS/BLUE SHIELD | Admitting: Family Medicine

## 2014-10-06 ENCOUNTER — Encounter: Payer: Self-pay | Admitting: Obstetrics and Gynecology

## 2014-10-06 ENCOUNTER — Ambulatory Visit (INDEPENDENT_AMBULATORY_CARE_PROVIDER_SITE_OTHER): Payer: BLUE CROSS/BLUE SHIELD | Admitting: Obstetrics and Gynecology

## 2014-10-06 VITALS — BP 131/84 | HR 43 | Resp 16 | Ht 63.0 in | Wt 185.0 lb

## 2014-10-06 DIAGNOSIS — Z5189 Encounter for other specified aftercare: Secondary | ICD-10-CM | POA: Diagnosis not present

## 2014-10-06 NOTE — Progress Notes (Signed)
Patient ID: Cindy Walker, female   DOB: 008-Jun-1987, 29 y.o.   MRN: [id number] 29 yo G3P3 s/p repeat cesarean section on 09/27/2014 at [redacted]w[redacted]d secondary to placental abruption presenting today for a wound check. Patient reports doing well and denies fever, chills, abnormal drainage from incision or uncontrolled pain. She is not sexually active and is planning to use condoms for contraception while breastfeeding  Past Medical History  Diagnosis Date  . Migraine     with aura  . Asthma   . Fibrocystic breast   . Breast disorder     cyst on Lt breast-removed 5/14  . Anxiety   . Anemia    Past Surgical History  Procedure Laterality Date  . Cesarean section  01/11/11    No BTL done  . Breast surgery      cyst removed 5/14  . Wisdom tooth extraction    . Cesarean section N/A 04/16/2012    Procedure: Cesarean Section;  Surgeon: Guss Bunde, MD;  Location: [facility];  Service: Obstetrics;  Laterality: N/A;  Wound Class (Clean contaminated)   Family History  Problem Relation Age of Onset  . Diabetes Maternal Aunt     Type 2  . Heart disease Maternal Grandmother   . Cancer Maternal Grandfather     liver cancer  . Heart disease Maternal Grandfather   . Heart disease Paternal Grandmother   . Heart disease Paternal Grandfather   . Cleft palate Son     deceased   History  Substance Use Topics  . Smoking status: Former Smoker -- 0.50 packs/day for 7 years    Types: Cigarettes    Quit date: 12/19/2008  . Smokeless tobacco: Never Used  . Alcohol Use: No   GENERAL: Well-developed, well-nourished female in no acute distress.  ABDOMEN: Soft, nontender, nondistended. No organomegaly. Incision: no erythema, induration or drainage. Healing well EXTREMITIES: No cyanosis, clubbing, or edema, 2+ distal pulses.  A/P 29 yo here for wound check - Incision healing well - post-op care instructions reviewed - RTC in 5 weeks for postpartum exam

## 2014-10-09 ENCOUNTER — Other Ambulatory Visit: Payer: Self-pay | Admitting: Obstetrics & Gynecology

## 2014-10-10 ENCOUNTER — Inpatient Hospital Stay (HOSPITAL_COMMUNITY): Admission: RE | Admit: 2014-10-10 | Payer: BLUE CROSS/BLUE SHIELD | Source: Ambulatory Visit

## 2014-10-13 ENCOUNTER — Inpatient Hospital Stay (HOSPITAL_COMMUNITY)
Admission: AD | Admit: 2014-10-13 | Payer: BLUE CROSS/BLUE SHIELD | Source: Ambulatory Visit | Admitting: Family Medicine

## 2014-10-13 SURGERY — Surgical Case
Anesthesia: Regional | Site: Abdomen

## 2014-10-15 ENCOUNTER — Telehealth: Payer: Self-pay | Admitting: *Deleted

## 2014-10-15 DIAGNOSIS — K429 Umbilical hernia without obstruction or gangrene: Secondary | ICD-10-CM

## 2014-10-15 NOTE — Telephone Encounter (Signed)
Patient called and needed a referral to a general surgeon for an Umbilical hernia.  I have sent over the referral.  They should call patient with an appointment.

## 2014-11-07 ENCOUNTER — Other Ambulatory Visit: Payer: Self-pay | Admitting: Surgery

## 2014-11-10 ENCOUNTER — Ambulatory Visit (INDEPENDENT_AMBULATORY_CARE_PROVIDER_SITE_OTHER): Payer: BLUE CROSS/BLUE SHIELD | Admitting: Family Medicine

## 2014-11-10 VITALS — BP 112/73 | HR 63 | Ht 63.0 in | Wt 181.0 lb

## 2014-11-10 DIAGNOSIS — Z30011 Encounter for initial prescription of contraceptive pills: Secondary | ICD-10-CM | POA: Diagnosis not present

## 2014-11-10 MED ORDER — NORGESTIMATE-ETH ESTRADIOL 0.25-35 MG-MCG PO TABS: 1.0000 | ORAL_TABLET | Freq: Every day | ORAL | Status: DC

## 2014-11-10 NOTE — Patient Instructions (Signed)
Postpartum Depression and Baby Blues The postpartum period begins right after the birth of a baby. During this time, there is often a great amount of joy and excitement. It is also a time of many changes in the life of the parents. Regardless of how many times a mother gives birth, each child brings new challenges and dynamics to the family. It is not unusual to have feelings of excitement along with confusing shifts in moods, emotions, and thoughts. All mothers are at risk of developing postpartum depression or the "baby blues." These mood changes can occur right after giving birth, or they may occur many months after giving birth. The baby blues or postpartum depression can be mild or severe. Additionally, postpartum depression can go away rather quickly, or it can be a long-term condition.  CAUSES Raised hormone levels and the rapid drop in those levels are thought to be a main cause of postpartum depression and the baby blues. A number of hormones change during and after pregnancy. Estrogen and progesterone usually decrease right after the delivery of your baby. The levels of thyroid hormone and various cortisol steroids also rapidly drop. Other factors that play a role in these mood changes include major life events and genetics.  RISK FACTORS If you have any of the following risks for the baby blues or postpartum depression, know what symptoms to watch out for during the postpartum period. Risk factors that may increase the likelihood of getting the baby blues or postpartum depression include:  Having a personal or family history of depression.   Having depression while being pregnant.   Having premenstrual mood issues or mood issues related to oral contraceptives.  Having a lot of life stress.   Having marital conflict.   Lacking a social support network.   Having a baby with special needs.   Having health problems, such as diabetes.  SIGNS AND SYMPTOMS Symptoms of baby blues  include:  Brief changes in mood, such as going from extreme happiness to sadness.  Decreased concentration.   Difficulty sleeping.   Crying spells, tearfulness.   Irritability.   Anxiety.  Symptoms of postpartum depression typically begin within the first month after giving birth. These symptoms include:  Difficulty sleeping or excessive sleepiness.   Marked weight loss.   Agitation.   Feelings of worthlessness.   Lack of interest in activity or food.  Postpartum psychosis is a very serious condition and can be dangerous. Fortunately, it is rare. Displaying any of the following symptoms is cause for immediate medical attention. Symptoms of postpartum psychosis include:   Hallucinations and delusions.   Bizarre or disorganized behavior.   Confusion or disorientation.  DIAGNOSIS  A diagnosis is made by an evaluation of your symptoms. There are no medical or lab tests that lead to a diagnosis, but there are various questionnaires that a health care provider may use to identify those with the baby blues, postpartum depression, or psychosis. Often, a screening tool called the [place] Postnatal Depression Scale is used to diagnose depression in the postpartum period.  TREATMENT The baby blues usually goes away on its own in 1-2 weeks. Social support is often all that is needed. You will be encouraged to get adequate sleep and rest. Occasionally, you may be given medicines to help you sleep.  Postpartum depression requires treatment because it can last several months or longer if it is not treated. Treatment may include individual or group therapy, medicine, or both to address any social, physiological, and psychological  factors that may play a role in the depression. Regular exercise, a healthy diet, rest, and social support may also be strongly recommended.  Postpartum psychosis is more serious and needs treatment right away. Hospitalization is often needed. HOME CARE  INSTRUCTIONS  Get as much rest as you can. Nap when the baby sleeps.   Exercise regularly. Some women find yoga and walking to be beneficial.   Eat a balanced and nourishing diet.   Do little things that you enjoy. Have a cup of tea, take a bubble bath, read your favorite magazine, or listen to your favorite music.  Avoid alcohol.   Ask for help with household chores, cooking, grocery shopping, or running errands as needed. Do not try to do everything.   Talk to people close to you about how you are feeling. Get support from your partner, family members, friends, or other new moms.  Try to stay positive in how you think. Think about the things you are grateful for.   Do not spend a lot of time alone.   Only take over-the-counter or prescription medicine as directed by your health care provider.  Keep all your postpartum appointments.   Let your health care provider know if you have any concerns.  SEEK MEDICAL CARE IF: You are having a reaction to or problems with your medicine. SEEK IMMEDIATE MEDICAL CARE IF:  You have suicidal feelings.   You think you may harm the baby or someone else. MAKE SURE YOU:  Understand these instructions.  Will watch your condition.  Will get help right away if you are not doing well or get worse. Document Released: 12/10/2003 Document Revised: 03/12/2013 Document Reviewed: 12/17/2012 [facility] Patient Information 2015 [place], [place]. This information is not intended to replace advice given to you by your health care provider. Make sure you discuss any questions you have with your health care provider.

## 2014-11-10 NOTE — Progress Notes (Signed)
Patient ID: Cindy Walker, female   DOB: June 06, 1985, 29 y.o.   MRN: [id number] Here today for postpartum visit.  Wants to go back onto [employer] for birth control.  Has not had intercourse yet.

## 2014-11-10 NOTE — Progress Notes (Signed)
  Subjective:     Cindy Walker is a 29 y.o. female who presents for a postpartum visit. She is 6 weeks postpartum following a low cervical transverse Cesarean section. I have fully reviewed the prenatal and intrapartum course. The delivery was at 23 gestational weeks. Outcome: repeat cesarean section, low transverse incision. Anesthesia: spinal. Postpartum course has been normal. Baby's course has been normal. Baby is feeding by breast. Bleeding no bleeding. Bowel function is normal. Bladder function is normal. Patient is not sexually active. Contraception method is none. Postpartum depression screening: negative.  The following portions of the patient's history were reviewed and updated as appropriate: allergies, current medications, past family history, past medical history, past social history, past surgical history and problem list.  Review of Systems A comprehensive review of systems was negative.   Objective:    BP 112/73 mmHg  Pulse 63  Ht 5\' 3"  (1.6 m)  Wt 181 lb (82.101 kg)  BMI 32.07 kg/m2  Breastfeeding? No  General:  alert, cooperative and appears stated age  Abdomen: soft, non-tender; bowel sounds normal; no masses,  no organomegaly and incision is well healed   Vulva:  normal  Vagina: normal vagina, no discharge, exudate, lesion, or erythema  Cervix:  anteverted and no lesions  Corpus: normal size, contour, position, consistency, mobility, non-tender  Adnexa:  normal adnexa        Assessment:     Normal postpartum exam. Pap smear not done at today's visit.   Plan:    1. Contraception: OCP (estrogen/progesterone) 2. Pap due 2018 or later 3. Follow up in: 1 year or as needed.

## 2014-11-11 ENCOUNTER — Encounter: Payer: Self-pay | Admitting: Family Medicine

## 2014-11-20 DIAGNOSIS — K429 Umbilical hernia without obstruction or gangrene: Secondary | ICD-10-CM

## 2014-11-20 HISTORY — DX: Umbilical hernia without obstruction or gangrene: K42.9

## 2014-11-21 ENCOUNTER — Encounter (HOSPITAL_BASED_OUTPATIENT_CLINIC_OR_DEPARTMENT_OTHER): Payer: Self-pay | Admitting: *Deleted

## 2014-11-30 NOTE — H&P (Signed)
Cindy Walker. Raider 11/07/2014 10:14 AM Location: [facility] Patient #: [id number] DOB: [birth year] Married / Language: English / Race: White Female  History of Present Illness (Cindy Walker A. Ninfa Linden MD; 11/07/2014 10:26 AM) Patient words: umb hernia.  The patient is a 29 year old female who presents with an umbilical hernia. This is a very pleasant patient referred to me by Dr. Jeannetta Walker for a symptomatic hernia above the umbilicus. She is status post C-section in early July. The hernia developed during the pregnancy. It has persisted postpartum. She has mild sharp discomfort. It does not refer anywhere else. She has no objective symptoms. She is otherwise healthy without complaints.   Other Problems Cindy Walker; 11/07/2014 10:14 AM) Anxiety Disorder Asthma Lump In Breast Migraine Headache  Past Surgical History Cindy Walker; 11/07/2014 10:14 AM) Breast Mass; Local Excision Left. Cesarean Section - Multiple  Diagnostic Studies History Cindy Walker; 11/07/2014 10:14 AM) Colonoscopy >10 years ago Mammogram >3 years ago Pap Smear 1-5 years ago  Allergies Cindy Walker; 11/07/2014 10:15 AM) No Known Drug Allergies08/19/2016  Medication History Cindy Walker; 11/07/2014 10:15 AM) No Current Medications Medications Reconciled  Social History Cindy Walker; 11/07/2014 10:14 AM) Alcohol use Moderate alcohol use. Caffeine use Carbonated beverages, Coffee, Tea. No drug use Tobacco use Former smoker.  Family History Cindy Walker, Cindy Walker; 11/07/2014 10:14 AM) Colon Polyps Mother. Migraine Headache Mother.  Pregnancy / Birth History Cindy Walker; 11/07/2014 10:14 AM) Age at menarche 38 years. Contraceptive History Oral contraceptives. Gravida 3 Maternal age 2-30 Para 3 Regular periods  Review of Systems Cindy Walker Walker; 11/07/2014 10:15 AM) General Not Present- Appetite Loss, Chills, Fatigue, Fever, Night  Sweats, Weight Gain and Weight Loss. Skin Not Present- Change in Wart/Mole, Dryness, Hives, Jaundice, New Lesions, Non-Healing Wounds, Rash and Ulcer. HEENT Present- Seasonal Allergies. Not Present- Earache, Hearing Loss, Hoarseness, Nose Bleed, Oral Ulcers, Ringing in the Ears, Sinus Pain, Sore Throat, Visual Disturbances, Wears glasses/contact lenses and Yellow Eyes. Respiratory Not Present- Bloody sputum, Chronic Cough, Difficulty Breathing, Snoring and Wheezing. Breast Not Present- Breast Mass, Breast Pain, Nipple Discharge and Skin Changes. Gastrointestinal Present- Abdominal Pain. Not Present- Bloating, Bloody Stool, Change in Bowel Habits, Chronic diarrhea, Constipation, Difficulty Swallowing, Excessive gas, Gets full quickly at meals, Hemorrhoids, Indigestion, Nausea, Rectal Pain and Vomiting. Neurological Not Present- Decreased Memory, Fainting, Headaches, Numbness, Seizures, Tingling, Tremor, Trouble walking and Weakness. Psychiatric Not Present- Anxiety, Bipolar, Change in Sleep Pattern, Depression, Fearful and Frequent crying. Hematology Not Present- Easy Bruising, Excessive bleeding, Gland problems, HIV and Persistent Infections.   Vitals Cindy Walker Walker; 11/07/2014 10:15 AM) 11/07/2014 10:15 AM Weight: 177 lb Height: 63in Body Surface Area: 1.89 m Body Mass Index: 31.35 kg/m Temp.: 97.63F(Temporal)  Pulse: 62 (Regular)  BP: 126/70 (Sitting, Left Arm, Standard)    Physical Exam (Buzz Axel A. Ninfa Linden MD; 11/07/2014 10:27 AM) General Mental Status-Alert. General Appearance-Consistent with stated age. Hydration-Well hydrated. Voice-Normal.  Head and Neck Head-normocephalic, atraumatic with no lesions or palpable masses. Trachea-midline.  Eye Eyeball - Bilateral-Extraocular movements intact. Sclera/Conjunctiva - Bilateral-No scleral icterus.  Chest and Lung Exam Chest and lung exam reveals -quiet, even and easy respiratory effort with no use  of accessory muscles and on auscultation, normal breath sounds, no adventitious sounds and normal vocal resonance. Inspection Chest Wall - Normal. Back - normal.  Cardiovascular Cardiovascular examination reveals -normal heart sounds, regular rate and rhythm with no murmurs and normal pedal pulses bilaterally.  Abdomen Inspection Skin - Scar -  no surgical scars. Hernias - Umbilical hernia - Incarcerated. Palpation/Percussion Palpation and Percussion of the abdomen reveal - Soft, Non Tender, No Rebound tenderness, No Rigidity (guarding) and No hepatosplenomegaly. Auscultation Auscultation of the abdomen reveals - Bowel sounds normal.  Neurologic - Did not examine.  Musculoskeletal Normal Exam - Left-Upper Extremity Strength Normal and Lower Extremity Strength Normal. Normal Exam - Right-Upper Extremity Strength Normal, Lower Extremity Weakness.    Assessment & Plan (Tiaja Hagan A. Ninfa Linden MD; 07/06/4079 44:81 AM) UMBILICAL HERNIA (856.3  K42.9) Impression: I discussed the diagnosis with her in detail. I do not believe this will completely resolve without surgery. I discussed the reasons for surgery with her. I gave her an educational pamphlet regarding hernias. I would recommend open umbilical hernia repair with possible mesh. I discussed the risks of surgery which include but is not limited to bleeding, infection, injury to his friend structures, adhesive mesh, recurrence, etc. I discussed postoperative recovery. She understands and wishes to proceed Current Plans  Pt Education - Pamphlet Given - Hernia Surgery: discussed with patient and provided information.

## 2014-12-01 ENCOUNTER — Ambulatory Visit (HOSPITAL_BASED_OUTPATIENT_CLINIC_OR_DEPARTMENT_OTHER)
Admission: RE | Admit: 2014-12-01 | Discharge: 2014-12-01 | Disposition: A | Discharge status: Home / Self Care | Payer: BLUE CROSS/BLUE SHIELD | Source: Ambulatory Visit | Attending: Surgery | Admitting: Surgery

## 2014-12-01 ENCOUNTER — Ambulatory Visit (HOSPITAL_BASED_OUTPATIENT_CLINIC_OR_DEPARTMENT_OTHER): Payer: BLUE CROSS/BLUE SHIELD | Admitting: Anesthesiology

## 2014-12-01 ENCOUNTER — Encounter (HOSPITAL_BASED_OUTPATIENT_CLINIC_OR_DEPARTMENT_OTHER): Payer: Self-pay | Admitting: *Deleted

## 2014-12-01 ENCOUNTER — Encounter (HOSPITAL_BASED_OUTPATIENT_CLINIC_OR_DEPARTMENT_OTHER)
Admission: RE | Disposition: A | Discharge status: Home / Self Care | Payer: Self-pay | Source: Ambulatory Visit | Attending: Surgery

## 2014-12-01 DIAGNOSIS — G43909 Migraine, unspecified, not intractable, without status migrainosus: Secondary | ICD-10-CM | POA: Diagnosis not present

## 2014-12-01 DIAGNOSIS — K429 Umbilical hernia without obstruction or gangrene: Secondary | ICD-10-CM | POA: Insufficient documentation

## 2014-12-01 DIAGNOSIS — J45909 Unspecified asthma, uncomplicated: Secondary | ICD-10-CM | POA: Insufficient documentation

## 2014-12-01 DIAGNOSIS — Z87891 Personal history of nicotine dependence: Secondary | ICD-10-CM | POA: Insufficient documentation

## 2014-12-01 DIAGNOSIS — E669 Obesity, unspecified: Secondary | ICD-10-CM | POA: Insufficient documentation

## 2014-12-01 DIAGNOSIS — D649 Anemia, unspecified: Secondary | ICD-10-CM | POA: Insufficient documentation

## 2014-12-01 DIAGNOSIS — Z6833 Body mass index (BMI) 33.0-33.9, adult: Secondary | ICD-10-CM | POA: Insufficient documentation

## 2014-12-01 HISTORY — PX: UMBILICAL HERNIA REPAIR: SHX196

## 2014-12-01 HISTORY — PX: INSERTION OF MESH: SHX5868

## 2014-12-01 HISTORY — DX: Umbilical hernia without obstruction or gangrene: K42.9

## 2014-12-01 LAB — POCT HEMOGLOBIN-HEMACUE: HEMOGLOBIN: 11.7 g/dL — AB (ref 12.0–15.0)

## 2014-12-01 SURGERY — REPAIR, HERNIA, UMBILICAL, ADULT
Anesthesia: General | Site: Abdomen

## 2014-12-01 MED ORDER — SODIUM CHLORIDE 0.9 % IV SOLN: 250.0000 mL | INTRAVENOUS | Status: DC | PRN

## 2014-12-01 MED ORDER — LIDOCAINE HCL (CARDIAC) 20 MG/ML IV SOLN
INTRAVENOUS | Status: AC
  Filled 2014-12-01: qty 5

## 2014-12-01 MED ORDER — BUPIVACAINE-EPINEPHRINE 0.5% -1:200000 IJ SOLN
INTRAMUSCULAR | Status: DC | PRN
  Administered 2014-12-01: 16 mL

## 2014-12-01 MED ORDER — BUPIVACAINE-EPINEPHRINE (PF) 0.5% -1:200000 IJ SOLN
INTRAMUSCULAR | Status: AC
  Filled 2014-12-01: qty 120

## 2014-12-01 MED ORDER — GLYCOPYRROLATE 0.2 MG/ML IJ SOLN: 0.2000 mg | Freq: Once | INTRAMUSCULAR | Status: DC | PRN

## 2014-12-01 MED ORDER — DEXAMETHASONE SODIUM PHOSPHATE 10 MG/ML IJ SOLN
INTRAMUSCULAR | Status: AC
  Filled 2014-12-01: qty 1

## 2014-12-01 MED ORDER — OXYCODONE-ACETAMINOPHEN 5-325 MG PO TABS: 1.0000 | ORAL_TABLET | ORAL | Status: DC | PRN

## 2014-12-01 MED ORDER — ACETAMINOPHEN 325 MG PO TABS: 650.0000 mg | ORAL_TABLET | ORAL | Status: DC | PRN

## 2014-12-01 MED ORDER — LIDOCAINE HCL (CARDIAC) 20 MG/ML IV SOLN
INTRAVENOUS | Status: DC | PRN
  Administered 2014-12-01: 50 mg via INTRAVENOUS

## 2014-12-01 MED ORDER — KETOROLAC TROMETHAMINE 30 MG/ML IJ SOLN
INTRAMUSCULAR | Status: DC | PRN
  Administered 2014-12-01: 30 mg via INTRAVENOUS

## 2014-12-01 MED ORDER — CEFAZOLIN SODIUM-DEXTROSE 2-3 GM-% IV SOLR
INTRAVENOUS | Status: DC | PRN
Start: 2014-12-01 — End: 2014-12-01
  Administered 2014-12-01: 2 g via INTRAVENOUS

## 2014-12-01 MED ORDER — FENTANYL CITRATE (PF) 100 MCG/2ML IJ SOLN
INTRAMUSCULAR | Status: AC
Start: 2014-12-01 — End: 2014-12-01
  Filled 2014-12-01: qty 4

## 2014-12-01 MED ORDER — ONDANSETRON HCL 4 MG/2ML IJ SOLN
INTRAMUSCULAR | Status: DC | PRN
  Administered 2014-12-01: 4 mg via INTRAVENOUS

## 2014-12-01 MED ORDER — PROMETHAZINE HCL 25 MG/ML IJ SOLN: 6.2500 mg | INTRAMUSCULAR | Status: DC | PRN

## 2014-12-01 MED ORDER — KETOROLAC TROMETHAMINE 30 MG/ML IJ SOLN
INTRAMUSCULAR | Status: AC
  Filled 2014-12-01: qty 1

## 2014-12-01 MED ORDER — LACTATED RINGERS IV SOLN
INTRAVENOUS | Status: DC
  Administered 2014-12-01: 07:00:00 via INTRAVENOUS

## 2014-12-01 MED ORDER — ONDANSETRON HCL 4 MG/2ML IJ SOLN
INTRAMUSCULAR | Status: AC
  Filled 2014-12-01: qty 2

## 2014-12-01 MED ORDER — SCOPOLAMINE 1 MG/3DAYS TD PT72: 1.0000 | MEDICATED_PATCH | Freq: Once | TRANSDERMAL | Status: DC | PRN

## 2014-12-01 MED ORDER — ACETAMINOPHEN 650 MG RE SUPP: 650.0000 mg | RECTAL | Status: DC | PRN

## 2014-12-01 MED ORDER — OXYCODONE HCL 5 MG PO TABS: 5.0000 mg | ORAL_TABLET | ORAL | Status: DC | PRN

## 2014-12-01 MED ORDER — MIDAZOLAM HCL 2 MG/2ML IJ SOLN
INTRAMUSCULAR | Status: AC
  Filled 2014-12-01: qty 4

## 2014-12-01 MED ORDER — FENTANYL CITRATE (PF) 100 MCG/2ML IJ SOLN
50.0000 ug | INTRAMUSCULAR | Status: DC | PRN
  Administered 2014-12-01: 100 ug via INTRAVENOUS
  Administered 2014-12-01: 25 ug via INTRAVENOUS

## 2014-12-01 MED ORDER — SODIUM CHLORIDE 0.9 % IJ SOLN: 3.0000 mL | INTRAMUSCULAR | Status: DC | PRN

## 2014-12-01 MED ORDER — CEFAZOLIN SODIUM-DEXTROSE 2-3 GM-% IV SOLR
INTRAVENOUS | Status: AC
Start: 2014-12-01 — End: 2014-12-01
  Filled 2014-12-01: qty 50

## 2014-12-01 MED ORDER — DEXAMETHASONE SODIUM PHOSPHATE 4 MG/ML IJ SOLN
INTRAMUSCULAR | Status: DC | PRN
  Administered 2014-12-01: 10 mg via INTRAVENOUS

## 2014-12-01 MED ORDER — MORPHINE SULFATE (PF) 2 MG/ML IV SOLN: 1.0000 mg | INTRAVENOUS | Status: DC | PRN

## 2014-12-01 MED ORDER — MIDAZOLAM HCL 2 MG/2ML IJ SOLN
1.0000 mg | INTRAMUSCULAR | Status: DC | PRN
  Administered 2014-12-01: 2 mg via INTRAVENOUS

## 2014-12-01 MED ORDER — SUCCINYLCHOLINE CHLORIDE 20 MG/ML IJ SOLN
INTRAMUSCULAR | Status: AC
  Filled 2014-12-01: qty 1

## 2014-12-01 MED ORDER — SODIUM BICARBONATE 4 % IV SOLN
INTRAVENOUS | Status: AC
  Filled 2014-12-01: qty 5

## 2014-12-01 MED ORDER — HYDROMORPHONE HCL 1 MG/ML IJ SOLN
INTRAMUSCULAR | Status: AC
  Filled 2014-12-01: qty 1

## 2014-12-01 MED ORDER — SODIUM CHLORIDE 0.9 % IJ SOLN: 3.0000 mL | Freq: Two times a day (BID) | INTRAMUSCULAR | Status: DC

## 2014-12-01 MED ORDER — FENTANYL CITRATE (PF) 100 MCG/2ML IJ SOLN
INTRAMUSCULAR | Status: AC
  Filled 2014-12-01: qty 4

## 2014-12-01 MED ORDER — PROPOFOL 500 MG/50ML IV EMUL
INTRAVENOUS | Status: AC
  Filled 2014-12-01: qty 50

## 2014-12-01 MED ORDER — ARTIFICIAL TEARS OP OINT
TOPICAL_OINTMENT | OPHTHALMIC | Status: AC
  Filled 2014-12-01: qty 3.5

## 2014-12-01 MED ORDER — LIDOCAINE HCL (PF) 1 % IJ SOLN
INTRAMUSCULAR | Status: AC
  Filled 2014-12-01: qty 30

## 2014-12-01 MED ORDER — CEFAZOLIN SODIUM-DEXTROSE 2-3 GM-% IV SOLR: 2.0000 g | INTRAVENOUS | Status: DC

## 2014-12-01 MED ORDER — HYDROMORPHONE HCL 1 MG/ML IJ SOLN
0.2500 mg | INTRAMUSCULAR | Status: DC | PRN
  Administered 2014-12-01: 0.5 mg via INTRAVENOUS
  Administered 2014-12-01: 0.25 mg via INTRAVENOUS

## 2014-12-01 MED ORDER — PROPOFOL 10 MG/ML IV BOLUS
INTRAVENOUS | Status: DC | PRN
  Administered 2014-12-01: 200 mg via INTRAVENOUS

## 2014-12-01 SURGICAL SUPPLY — 40 items
BLADE CLIPPER SURG (BLADE) IMPLANT
BLADE HEX COATED 2.75 (ELECTRODE) ×3 IMPLANT
BLADE SURG 15 STRL LF DISP TIS (BLADE) ×1 IMPLANT
BLADE SURG 15 STRL SS (BLADE) ×2
CANISTER SUCT 1200ML W/VALVE (MISCELLANEOUS) IMPLANT
CHLORAPREP W/TINT 26ML (MISCELLANEOUS) ×3 IMPLANT
COVER BACK TABLE 60X90IN (DRAPES) ×3 IMPLANT
COVER MAYO STAND STRL (DRAPES) ×3 IMPLANT
DECANTER SPIKE VIAL GLASS SM (MISCELLANEOUS) IMPLANT
DRAPE LAPAROTOMY 100X72 PEDS (DRAPES) ×3 IMPLANT
DRAPE UTILITY XL STRL (DRAPES) ×3 IMPLANT
DRSG TEGADERM 2-3/8X2-3/4 SM (GAUZE/BANDAGES/DRESSINGS) IMPLANT
ELECT REM PT RETURN 9FT ADLT (ELECTROSURGICAL) ×3
ELECTRODE REM PT RTRN 9FT ADLT (ELECTROSURGICAL) ×1 IMPLANT
GLOVE SURG SIGNA 7.5 PF LTX (GLOVE) ×3 IMPLANT
GOWN STRL REUS W/ TWL LRG LVL3 (GOWN DISPOSABLE) ×1 IMPLANT
GOWN STRL REUS W/ TWL XL LVL3 (GOWN DISPOSABLE) ×1 IMPLANT
GOWN STRL REUS W/TWL LRG LVL3 (GOWN DISPOSABLE) ×2
GOWN STRL REUS W/TWL XL LVL3 (GOWN DISPOSABLE) ×2
LIQUID BAND (GAUZE/BANDAGES/DRESSINGS) ×6 IMPLANT
MESH VENTRALEX ST 1-7/10 CRC S (Mesh General) ×3 IMPLANT
NEEDLE HYPO 25X1 1.5 SAFETY (NEEDLE) ×3 IMPLANT
NS IRRIG 1000ML POUR BTL (IV SOLUTION) IMPLANT
PACK BASIN DAY SURGERY FS (CUSTOM PROCEDURE TRAY) ×3 IMPLANT
PENCIL BUTTON HOLSTER BLD 10FT (ELECTRODE) ×3 IMPLANT
SLEEVE SCD COMPRESS KNEE MED (MISCELLANEOUS) ×3 IMPLANT
SPONGE LAP 4X18 X RAY DECT (DISPOSABLE) IMPLANT
SUT MNCRL AB 4-0 PS2 18 (SUTURE) ×3 IMPLANT
SUT NOVA 0 T19/GS 22DT (SUTURE) IMPLANT
SUT NOVA NAB DX-16 0-1 5-0 T12 (SUTURE) IMPLANT
SUT VIC AB 2-0 SH 27 (SUTURE)
SUT VIC AB 2-0 SH 27XBRD (SUTURE) IMPLANT
SUT VIC AB 3-0 SH 27 (SUTURE) ×2
SUT VIC AB 3-0 SH 27X BRD (SUTURE) ×1 IMPLANT
SYR CONTROL 10ML LL (SYRINGE) ×3 IMPLANT
TOWEL OR 17X24 6PK STRL BLUE (TOWEL DISPOSABLE) ×3 IMPLANT
TOWEL OR NON WOVEN STRL DISP B (DISPOSABLE) ×3 IMPLANT
TUBE CONNECTING 20'X1/4 (TUBING)
TUBE CONNECTING 20X1/4 (TUBING) IMPLANT
YANKAUER SUCT BULB TIP NO VENT (SUCTIONS) IMPLANT

## 2014-12-01 NOTE — Transfer of Care (Signed)
Immediate Anesthesia Transfer of Care Note  Patient: Cindy Walker  Procedure(s) Performed: Procedure(s): UMBILICAL HERNIA REPAIR  (N/A) INSERTION OF MESH (N/A)  Patient Location: PACU  Anesthesia Type:General  Level of Consciousness: sedated  Airway & Oxygen Therapy: Patient Spontanous Breathing and Patient connected to face mask oxygen  Post-op Assessment: Report given to RN and Post -op Vital signs reviewed and stable  Post vital signs: Reviewed and stable  Last Vitals:  Filed Vitals:   12/01/14 0818  BP:   Pulse: 105  Temp:   Resp: 25    Complications: No apparent anesthesia complications

## 2014-12-01 NOTE — Anesthesia Postprocedure Evaluation (Signed)
  Anesthesia Post-op Note  Patient: Cindy Walker  Procedure(s) Performed: Procedure(s): UMBILICAL HERNIA REPAIR  (N/A) INSERTION OF MESH (N/A)  Patient Location: PACU  Anesthesia Type:General  Level of Consciousness: awake, alert , oriented and patient cooperative  Airway and Oxygen Therapy: Patient Spontanous Breathing  Post-op Pain: mild  Post-op Assessment: Post-op Vital signs reviewed, Patient's Cardiovascular Status Stable, Respiratory Function Stable, Patent Airway and No signs of Nausea or vomiting              Post-op Vital Signs: Reviewed and stable  Last Vitals:  Filed Vitals:   12/01/14 0845  BP: 110/64  Pulse: 60  Temp:   Resp: 20    Complications: No apparent anesthesia complications

## 2014-12-01 NOTE — Anesthesia Preprocedure Evaluation (Signed)
Anesthesia Evaluation  Patient identified by MRN, date of birth, ID band Patient awake    Reviewed: Allergy & Precautions, NPO status , Patient's Chart, lab work & pertinent test results  History of Anesthesia Complications Negative for: history of anesthetic complications  Airway Mallampati: II  TM Distance: >3 FB Neck ROM: Full    Dental  (+) Teeth Intact, Dental Advisory Given   Pulmonary asthma , former smoker,    Pulmonary exam normal breath sounds clear to auscultation       Cardiovascular Exercise Tolerance: Good (-) hypertension(-) angina(-) Past MI negative cardio ROS Normal cardiovascular exam Rhythm:Regular Rate:Normal     Neuro/Psych  Headaches,    GI/Hepatic negative GI ROS, Neg liver ROS,   Endo/Other  obesity  Renal/GU negative Renal ROS     Musculoskeletal negative musculoskeletal ROS (+)   Abdominal   Peds  Hematology  (+) Blood dyscrasia, anemia ,   Anesthesia Other Findings Day of surgery medications reviewed with the patient.  Reproductive/Obstetrics                             Anesthesia Physical Anesthesia Plan  ASA: II  Anesthesia Plan: General   Post-op Pain Management:    Induction: Intravenous  Airway Management Planned: LMA  Additional Equipment:   Intra-op Plan:   Post-operative Plan: Extubation in OR  Informed Consent: I have reviewed the patients History and Physical, chart, labs and discussed the procedure including the risks, benefits and alternatives for the proposed anesthesia with the patient or authorized representative who has indicated his/her understanding and acceptance.   Dental advisory given  Plan Discussed with: CRNA  Anesthesia Plan Comments: (Risks/benefits of general anesthesia discussed with patient including risk of damage to teeth, lips, gum, and tongue, nausea/vomiting, allergic reactions to medications, and the  possibility of heart attack, stroke and death.  All patient questions answered.  Patient wishes to proceed.)        Anesthesia Quick Evaluation

## 2014-12-01 NOTE — Op Note (Signed)
UMBILICAL HERNIA REPAIR , INSERTION OF MESH  Procedure Note  Cindy Walker 12/01/2014   Pre-op Diagnosis: Umbilical Hernia     Post-op Diagnosis: same  Procedure(s): UMBILICAL HERNIA REPAIR  INSERTION OF MESH (4.3 cm round ventralite)  Surgeon(s): Coralie Keens, MD  Anesthesia: General  Staff:  Circulator: Maurene Capes, RN Scrub Person: Romero Liner, CST  Estimated Blood Loss: Minimal                        [name]   Date: 12/01/2014  Time: 8:12 AM

## 2014-12-01 NOTE — Discharge Instructions (Signed)
[facility] _______[facility]  UMBILICAL OR INGUINAL HERNIA REPAIR: POST OP INSTRUCTIONS  Always review your discharge instruction sheet given to you by the facility where your surgery was performed. IF YOU HAVE DISABILITY OR FAMILY LEAVE FORMS, YOU MUST BRING THEM TO THE OFFICE FOR PROCESSING.   DO NOT GIVE THEM TO YOUR DOCTOR.  1. A  prescription for pain medication may be given to you upon discharge.  Take your pain medication as prescribed, if needed.  If narcotic pain medicine is not needed, then you may take acetaminophen (Tylenol) or ibuprofen (Advil) as needed. 2. Take your usually prescribed medications unless otherwise directed. 3. If you need a refill on your pain medication, please contact your pharmacy.  They will contact our office to request authorization. Prescriptions will not be filled after 5 pm or on week-ends. 4. You should follow a light diet the first 24 hours after arrival home, such as soup and crackers, etc.  Be sure to include lots of fluids daily.  Resume your normal diet the day after surgery. 5. Most patients will experience some swelling and bruising around the umbilicus or in the groin and scrotum.  Ice packs and reclining will help.  Swelling and bruising can take several days to resolve.  6. It is common to experience some constipation if taking pain medication after surgery.  Increasing fluid intake and taking a stool softener (such as Colace) will usually help or prevent this problem from occurring.  A mild laxative (Milk of Magnesia or Miralax) should be taken according to package directions if there are no bowel movements after 48 hours. 7. Unless discharge instructions indicate otherwise, you may remove your bandages 24-48 hours after surgery, and you may shower at that time.  You may have steri-strips (small skin tapes) in place directly over the incision.  These strips should be left on the skin for 7-10 days.  If your surgeon used skin glue on the  incision, you may shower in 24 hours.  The glue will flake off over the next 2-3 weeks.  Any sutures or staples will be removed at the office during your follow-up visit. 8. ACTIVITIES:  You may resume regular (light) daily activities beginning the next day--such as daily self-care, walking, climbing stairs--gradually increasing activities as tolerated.  You may have sexual intercourse when it is comfortable.  Refrain from any heavy lifting or straining until approved by your doctor. a. You may drive when you are no longer taking prescription pain medication, you can comfortably wear a seatbelt, and you can safely maneuver your car and apply brakes. b. RETURN TO WORK:  __________________________________________________________ 9. You should see your doctor in the office for a follow-up appointment approximately 2-3 weeks after your surgery.  Make sure that you call for this appointment within a day or two after you arrive home to insure a convenient appointment time. 10. OTHER INSTRUCTIONS: OK TO SHOWER STARTING TOMORROW 11. ICE PACK AND IBUPROFEN ALSO FOR PAIN 12. STOOL SOFTENER FOR CONSTIPATION __________________________________________________________________________________________________________________________________________________________________________________________  WHEN TO CALL YOUR DOCTOR: 1. Fever over 101.0 2. Inability to urinate 3. Nausea and/or vomiting 4. Extreme swelling or bruising 5. Continued bleeding from incision. 6. Increased pain, redness, or drainage from the incision  The clinic staff is available to answer your questions during regular business hours.  Please dont hesitate to call and ask to speak to one of the nurses for clinical concerns.  If you have a medical emergency, go to the nearest emergency room or call  911.  A surgeon from [facility] is always on call at the hospital   [address], [place], [place], [place]  [address] ?  [address], [place],    [address] [phone] ? [phone] ? FAX [phone] Web site: [email or web address]    Post Anesthesia Home Care Instructions  Activity: Get plenty of rest for the remainder of the day. A responsible adult should stay with you for 24 hours following the procedure.  For the next 24 hours, DO NOT: -Drive a car -Paediatric nurse -Drink alcoholic beverages -Take any medication unless instructed by your physician -Make any legal decisions or sign important papers.  Meals: Start with liquid foods such as gelatin or soup. Progress to regular foods as tolerated. Avoid greasy, spicy, heavy foods. If nausea and/or vomiting occur, drink only clear liquids until the nausea and/or vomiting subsides. Call your physician if vomiting continues.  Special Instructions/Symptoms: Your throat may feel dry or sore from the anesthesia or the breathing tube placed in your throat during surgery. If this causes discomfort, gargle with warm salt water. The discomfort should disappear within 24 hours.  If you had a scopolamine patch placed behind your ear for the management of post- operative nausea and/or vomiting:  1. The medication in the patch is effective for 72 hours, after which it should be removed.  Wrap patch in a tissue and discard in the trash. Wash hands thoroughly with soap and water. 2. You may remove the patch earlier than 72 hours if you experience unpleasant side effects which may include dry mouth, dizziness or visual disturbances. 3. Avoid touching the patch. Wash your hands with soap and water after contact with the patch.

## 2014-12-01 NOTE — Interval H&P Note (Signed)
History and Physical Interval Note:no change in H and P  12/01/2014 7:07 AM  Cindy Walker  has presented today for surgery, with the diagnosis of Umbilical Hernia  The various methods of treatment have been discussed with the patient and family. After consideration of risks, benefits and other options for treatment, the patient has consented to  Procedure(s): UMBILICAL HERNIA REPAIR WITH POSSIBLE MESH (N/A) as a surgical intervention .  The patient's history has been reviewed, patient examined, no change in status, stable for surgery.  I have reviewed the patient's chart and labs.  Questions were answered to the patient's satisfaction.     Curley Fayette A

## 2014-12-01 NOTE — Anesthesia Procedure Notes (Signed)
Procedure Name: LMA Insertion Date/Time: 12/01/2014 7:38 AM Performed by: Melynda Ripple D Pre-anesthesia Checklist: Patient identified, Emergency Drugs available, Suction available and Patient being monitored Patient Re-evaluated:Patient Re-evaluated prior to inductionOxygen Delivery Method: Circle System Utilized Preoxygenation: Pre-oxygenation with 100% oxygen Intubation Type: IV induction Ventilation: Mask ventilation without difficulty LMA: LMA inserted LMA Size: 4.0 Number of attempts: 1 Airway Equipment and Method: bite block Placement Confirmation: positive ETCO2 Tube secured with: Tape Dental Injury: Teeth and Oropharynx as per pre-operative assessment

## 2014-12-01 NOTE — Op Note (Signed)
NAME:  Cindy Walker, Cindy Walker NO.:  [id number]  MEDICAL RECORD NO.:  [id number]  LOCATION:                               FACILITY:  [facility]  PHYSICIAN:  Coralie Keens, M.D. DATE OF BIRTH:  30-Mar-1985  DATE OF PROCEDURE:  12/01/2014 DATE OF DISCHARGE:                              OPERATIVE REPORT   PREOPERATIVE DIAGNOSIS:  Umbilical hernia.  POSTOPERATIVE DIAGNOSIS:  Umbilical hernia.  PROCEDURE:  Umbilical hernia repair with a mesh.  SURGEON:  Coralie Keens, M.D.  ANESTHESIA:  General and 0.5% Marcaine.  ESTIMATED BLOOD LOSS:  Minimal.  FINDINGS:  The patient was found to have a 1 cm fascial defect at the umbilicus containing incarcerated omentum.  It was repaired with a 4.3 cm round Ventralight patch from [employer].  PROCEDURE IN DETAIL:  The patient was brought to the operating room, identified as Environmental education officer.  She was placed supine on the operating table, and general anesthesia was induced.  Her abdomen was then prepped and draped in the usual sterile fashion.  I anesthetized the skin at the upper edge of the umbilicus with Marcaine.  I then made a longitudinal incision with a scalpel.  I took this down to the hernia sac which was easily identified and elevated.  I excised the sac which was found to contain omentum.  I had to excise a small amount of omentum and then reduce the rest back into the abdominal cavity.  Next, a 4.3 cm round Ventralight patch was brought on to the field.  I placed it through the fascial opening and then pulled it up against the fascia with the stay ties.  I then sutured the mesh in place with interrupted #1 Novafil sutures.  I cut the stay ties and closed the fascia over top of the mesh with a figure-of-eight #1 Novafil suture as well.  I then anesthetized the fascia with Marcaine.  Wide coverage of the fascial defect appeared to be achieved.  I then closed the subcutaneous tissue with interrupted 3-0 Vicryl sutures and  closed the skin with a running 4- 0 Monocryl.  Skin glue was then applied.  The patient tolerated the procedure well.  All the counts were correct at the end of the procedure.  The patient was then extubated in the operating room and was taken in a stable condition to the recovery room.     Coralie Keens, M.D.    DB/MEDQ  D:  12/01/2014  T:  12/01/2014  Job:  [id number]

## 2014-12-02 ENCOUNTER — Encounter (HOSPITAL_BASED_OUTPATIENT_CLINIC_OR_DEPARTMENT_OTHER): Payer: Self-pay | Admitting: Surgery

## 2015-01-21 ENCOUNTER — Encounter: Payer: Self-pay | Admitting: *Deleted

## 2015-01-27 ENCOUNTER — Encounter: Payer: Self-pay | Admitting: *Deleted

## 2015-05-11 ENCOUNTER — Other Ambulatory Visit: Payer: Self-pay | Admitting: Physician Assistant

## 2015-05-11 DIAGNOSIS — N63 Unspecified lump in unspecified breast: Secondary | ICD-10-CM

## 2015-05-11 DIAGNOSIS — N6019 Diffuse cystic mastopathy of unspecified breast: Secondary | ICD-10-CM

## 2015-05-25 ENCOUNTER — Ambulatory Visit
Admission: RE | Admit: 2015-05-25 | Discharge: 2015-05-25 | Disposition: A | Discharge status: Home / Self Care | Payer: BLUE CROSS/BLUE SHIELD | Source: Ambulatory Visit | Attending: Physician Assistant | Admitting: Physician Assistant

## 2015-05-25 DIAGNOSIS — R928 Other abnormal and inconclusive findings on diagnostic imaging of breast: Secondary | ICD-10-CM | POA: Diagnosis not present

## 2015-05-25 DIAGNOSIS — N63 Unspecified lump in unspecified breast: Secondary | ICD-10-CM

## 2015-05-25 DIAGNOSIS — N6019 Diffuse cystic mastopathy of unspecified breast: Secondary | ICD-10-CM

## 2015-08-17 ENCOUNTER — Other Ambulatory Visit: Payer: Self-pay | Admitting: Family Medicine

## 2015-12-30 ENCOUNTER — Ambulatory Visit (INDEPENDENT_AMBULATORY_CARE_PROVIDER_SITE_OTHER): Payer: BLUE CROSS/BLUE SHIELD | Admitting: Family Medicine

## 2015-12-30 ENCOUNTER — Encounter: Payer: Self-pay | Admitting: Family Medicine

## 2015-12-30 VITALS — BP 119/81 | HR 70 | Resp 18 | Ht 63.0 in | Wt 176.0 lb

## 2015-12-30 DIAGNOSIS — Z3169 Encounter for other general counseling and advice on procreation: Secondary | ICD-10-CM | POA: Diagnosis not present

## 2015-12-30 NOTE — Patient Instructions (Signed)
Preparing for Pregnancy Before trying to become pregnant, make an appointment with your health care provider (preconception care). The goal is to help you have a healthy, safe pregnancy. At your first appointment, your health care provider will:   Do a complete physical exam, including a Pap test.  Take a complete medical history.  Give you advice and help you resolve any problems. PRECONCEPTION CHECKLIST Here is a list of the basics to cover with your health care provider at your preconception visit:  Medical history.  Tell your health care provider about any diseases you have had. Many diseases can affect your pregnancy.  Include your partner's medical history and family history.  Make sure you have been tested for sexually transmitted infections (STIs). These can affect your pregnancy. In some cases, they can be passed to your baby. Tell your health care provider about any history of STIs.  Make sure your health care provider knows about any previous problems you have had with conception or pregnancy.  Tell your health care provider about any medicine you take. This includes herbal supplements and over-the-counter medicines.  Make sure all your immunizations are up to date. You may need to make additional appointments.  Ask your health care provider if you need any vaccinations or if there are any you should avoid.  Diet.  It is especially important to eat a healthy, balanced diet with the right nutrients when you are pregnant.  Ask your health care provider to help you get to a healthy weight before pregnancy.  If you are overweight, you are at higher risk for certain complications. These include high blood pressure, diabetes, and preterm birth.  If you are underweight, you are more likely to have a low-birth-weight baby.  Lifestyle.  Tell your health care provider about lifestyle factors such as alcohol use, drug use, or smoking.  Describe any harmful substances you may  be exposed to at work or home. These can include chemicals, pesticides, and radiation.  Mental health.  Let your health care provider know if you have been feeling depressed or anxious.  Let your health care provider know if you have a history of substance abuse.  Let your health care provider know if you do not feel safe at home. HOME INSTRUCTIONS TO PREPARE FOR PREGNANCY Follow your health care provider's advice and instructions.   Keep an accurate record of your menstrual periods. This makes it easier for your health care provider to determine your baby's due date.  Begin taking prenatal vitamins and folic acid supplements daily. Take them as directed by your health care provider.  Eat a balanced diet. Get help from a nutrition counselor if you have questions or need help.  Get regular exercise. Try to be active for at least 30 minutes a day most days of the week.  Quit smoking, if you smoke.  Do not drink alcohol.  Do not take illegal drugs.  Get medical problems, such as diabetes or high blood pressure, under control.  If you have diabetes, make sure you do the following:  Have good blood sugar control. If you have type 1 diabetes, use multiple daily doses of insulin. Do not use split-dose or premixed insulin.  Have an eye exam by a qualified eye care professional trained in caring for people with diabetes.  Get evaluated by your health care provider for cardiovascular disease.  Get to a healthy weight. If you are overweight or obese, reduce your weight with the help of a qualified health   professional such as a Firefighter. Ask your health care provider what the right weight range is for you. HOW DO I KNOW I AM PREGNANT? You may be pregnant if you have been sexually active and you miss your period. Symptoms of early pregnancy include:   Mild cramping.  Very light vaginal bleeding (spotting).  Feeling unusually tired.  Morning sickness. If you have any of  these symptoms, take a home pregnancy test. These tests look for a hormone called human chorionic gonadotropin (hCG) in your urine. Your body begins to make this hormone during early pregnancy. These tests are very accurate. Wait until at least the first day you miss your period to take one. If you get a positive result, call your health care provider to make appointments for prenatal care. WHAT SHOULD I DO IF I BECOME PREGNANT?  Make an appointment with your health care provider by week 12 of your pregnancy at the latest.  Do not smoke. Smoking can be harmful to your baby.  Do not drink alcoholic beverages. Alcohol is related to a number of birth defects.  Avoid toxic odors and chemicals.  You may continue to have sexual intercourse if it does not cause pain or other problems, such as vaginal bleeding.   This information is not intended to replace advice given to you by your health care provider. Make sure you discuss any questions you have with your health care provider.   Document Released: 02/18/2008 Document Revised: 03/28/2014 Document Reviewed: 02/11/2013 Elsevier Interactive Patient Education [employer].

## 2015-12-30 NOTE — Progress Notes (Signed)
   Subjective:    Patient ID: Cindy Walker is a 30 y.o. female presenting with Conception Counseling  on 12/30/2015  HPI: Here to discuss pregnancy possibilities. She has had 3 prior C-sections. [place] 35 [place]. Began bleeding.  Her second delivery, a planned c-section was noted incidentally to have ? Abruption and dark stained fluid. She is worried about this happening again with a subsequent pregnancy.  Review of Systems  Constitutional: Negative for chills and fever.  Respiratory: Negative for shortness of breath.   Cardiovascular: Negative for chest pain.  Gastrointestinal: Negative for abdominal pain, nausea and vomiting.  Genitourinary: Negative for dysuria.  Skin: Negative for rash.      Objective:    BP 119/81 (BP Location: Left Arm, Patient Position: Sitting, Cuff Size: Normal)   Pulse 70   Resp 18   Ht 5\' 3"  (1.6 m)   Wt 176 lb (79.8 kg)   LMP 12/28/2015   BMI 31.18 kg/m  Physical Exam  Constitutional: She is oriented to person, place, and time. She appears well-developed and well-nourished. No distress.  HENT:  Head: Normocephalic and atraumatic.  Eyes: No scleral icterus.  Neck: Neck supple.  Cardiovascular: Normal rate.   Pulmonary/Chest: Effort normal.  Abdominal: Soft.  Neurological: She is alert and oriented to person, place, and time.  Skin: Skin is warm and dry.  Psychiatric: She has a normal mood and affect.        Assessment & Plan:  Encounter for preconception consultation - Risks of pregnancy with 3 prior C-sections discussed, risk of subsequent abruption also with h/o cleft palate-begin PNV and folic acid at 4 mg daily dose   Total face-to-face time with patient: 15 minutes. Over 50% of encounter was spent on counseling and coordination of care. Return if symptoms worsen or fail to improve.  Cindy Walker 12/30/2015 9:17 AM

## 2016-01-04 ENCOUNTER — Other Ambulatory Visit: Payer: Self-pay | Admitting: Family Medicine

## 2016-02-09 ENCOUNTER — Telehealth: Payer: Self-pay | Admitting: *Deleted

## 2016-02-09 DIAGNOSIS — Z3041 Encounter for surveillance of contraceptive pills: Secondary | ICD-10-CM

## 2016-02-09 MED ORDER — NORGESTIMATE-ETH ESTRADIOL 0.25-35 MG-MCG PO TABS: 1.0000 | ORAL_TABLET | Freq: Every day | ORAL | 2 refills | Status: DC

## 2016-02-09 NOTE — Telephone Encounter (Signed)
Received fax from pharmacy to change OCPs to a 90 day supply, pt was seen in October for Preconception counseling.  Spoke with pt to verify, states she has decided to start the OCPs at this time and would like a few months refill.  Sent rx to pharmacy.

## 2016-04-05 ENCOUNTER — Encounter: Payer: Self-pay | Admitting: Family Medicine

## 2016-04-05 ENCOUNTER — Ambulatory Visit (INDEPENDENT_AMBULATORY_CARE_PROVIDER_SITE_OTHER): Payer: BLUE CROSS/BLUE SHIELD | Admitting: Family Medicine

## 2016-04-05 DIAGNOSIS — Z3009 Encounter for other general counseling and advice on contraception: Secondary | ICD-10-CM | POA: Diagnosis not present

## 2016-04-05 NOTE — Assessment & Plan Note (Signed)
Given previous surgeries, would recommend husband for vasectomy. If he is not willing, consider RUQ direct visualization entry and salpingectomy for ovarian cancer risk reduction. Risks include but are not limited to bleeding, infection, injury to surrounding structures, including bowel, bladder and ureters, blood clots, and death.  Likelihood of success is high. She will discuss with him and advise me if she wants to be scheduled.

## 2016-04-05 NOTE — Progress Notes (Signed)
   Subjective:    Patient ID: Cindy Walker is a 31 y.o. female presenting with Surgery Consult  on 04/05/2016  HPI: Here several months ago to discuss safety of having another baby. She reports her husband is not convinced of the safety and desires permanent sterilization. She has recent hernia repair at umbilicus with mesh and C-section x 3 (2 living children).  Review of Systems  Constitutional: Negative for chills and fever.  Respiratory: Negative for shortness of breath.   Cardiovascular: Negative for chest pain.  Gastrointestinal: Negative for abdominal pain, nausea and vomiting.  Genitourinary: Negative for dysuria.  Skin: Negative for rash.      Objective:    BP 125/75 (BP Location: Left Arm, Patient Position: Sitting, Cuff Size: Normal)   Pulse 65   Resp 18   Ht 5\' 3"  (1.6 m)   Wt 177 lb (80.3 kg)   LMP 03/24/2016   BMI 31.35 kg/m  Physical Exam  Constitutional: She is oriented to person, place, and time. She appears well-developed and well-nourished. No distress.  HENT:  Head: Normocephalic and atraumatic.  Eyes: No scleral icterus.  Neck: Neck supple.  Cardiovascular: Normal rate.   Pulmonary/Chest: Effort normal.  Abdominal: Soft.  Neurological: She is alert and oriented to person, place, and time.  Skin: Skin is warm and dry.  Psychiatric: She has a normal mood and affect.        Assessment & Plan:   Problem List Items Addressed This Visit      Unprioritized   Sterilization consult    Given previous surgeries, would recommend husband for vasectomy. If he is not willing, consider RUQ direct visualization entry and salpingectomy for ovarian cancer risk reduction. Risks include but are not limited to bleeding, infection, injury to surrounding structures, including bowel, bladder and ureters, blood clots, and death.  Likelihood of success is high. She will discuss with him and advise me if she wants to be scheduled.          Total face-to-face  time with patient: 25 minutes. Over 50% of encounter was spent on counseling and coordination of care. Return in about 3 months (around 07/04/2016).  [name] 04/05/2016 2:21 PM

## 2016-04-05 NOTE — Patient Instructions (Signed)
Laparoscopic Tubal Ligation Introduction Laparoscopic tubal ligation is a procedure to close the fallopian tubes. This is done so that you cannot get pregnant. When the fallopian tubes are closed, the eggs that your ovaries release cannot enter the uterus, and sperm cannot reach the released eggs. A laparoscopic tubal ligation is sometimes called "getting your tubes tied." You should not have this procedure if you want to get pregnant someday or if you are unsure about having more children. Tell a health care provider about:  Any allergies you have.  All medicines you are taking, including vitamins, herbs, eye drops, creams, and over-the-counter medicines.  Any problems you or family members have had with anesthetic medicines.  Any blood disorders you have.  Any surgeries you have had.  Any medical conditions you have.  Whether you are pregnant or may be pregnant.  Any past pregnancies. What are the risks? Generally, this is a safe procedure. However, problems may occur, including:  Infection.  Bleeding.  Injury to surrounding organs.  Side effects from anesthetics.  Failure of the procedure. This procedure can increase your risk of a kind of pregnancy in which a fertilized egg attaches to the outside of the uterus (ectopic pregnancy). What happens before the procedure?  Ask your health care provider about:  Changing or stopping your regular medicines. This is especially important if you are taking diabetes medicines or blood thinners.  Taking medicines such as aspirin and ibuprofen. These medicines can thin your blood. Do not take these medicines before your procedure if your health care provider instructs you not to.  Follow instructions from your health care provider about eating and drinking restrictions.  Plan to have someone take you home after the procedure.  If you go home right after the procedure, plan to have someone with you for 24 hours. What happens during  the procedure?  You will be given one or more of the following:  A medicine to help you relax (sedative).  A medicine to numb the area (local anesthetic).  A medicine to make you fall asleep (general anesthetic).  A medicine that is injected into an area of your body to numb everything below the injection site (regional anesthetic).  An IV tube will be inserted into one of your veins. It will be used to give you medicines and fluids during the procedure.  Your bladder may be emptied with a small tube (catheter).  If you have been given a general anesthetic, a tube will be put down your throat to help you breathe.  Two small cuts (incisions) will be made in your lower abdomen and near your belly button.  Your abdomen will be inflated with a gas. This will let the surgeon see better and will give the surgeon room to work.  A thin, lighted tube (laparoscope) with a camera attached will be inserted into your abdomen through one of the incisions. Small instruments will be inserted through the other incision.  The fallopian tubes will be tied off, burned (cauterized), or blocked with a clip, ring, or clamp. A small portion in the center of each fallopian tube may be removed.  The gas will be released from the abdomen.  The incisions will be closed with stitches (sutures).  A bandage (dressing) will be placed over the incisions. The procedure may vary among health care providers and hospitals. What happens after the procedure?  Your blood pressure, heart rate, breathing rate, and blood oxygen level will be monitored often until the medicines you  were given have worn off.  You will be given medicine to help with pain, nausea, and vomiting as needed. This information is not intended to replace advice given to you by your health care provider. Make sure you discuss any questions you have with your health care provider. Document Released: 06/13/2000 Document Revised: 08/13/2015 Document  Reviewed: 02/15/2015  2017 Elsevier

## 2016-04-08 ENCOUNTER — Encounter: Payer: Self-pay | Admitting: Family Medicine

## 2016-04-11 NOTE — Telephone Encounter (Signed)
Pt would like to proceed with BTL if you can set this up for her, thanks.

## 2016-04-26 ENCOUNTER — Encounter (HOSPITAL_COMMUNITY): Payer: Self-pay

## 2016-05-03 ENCOUNTER — Encounter (HOSPITAL_COMMUNITY): Payer: Self-pay

## 2016-05-06 ENCOUNTER — Encounter (HOSPITAL_COMMUNITY): Payer: Self-pay | Admitting: Family Medicine

## 2016-05-06 DIAGNOSIS — Z0001 Encounter for general adult medical examination with abnormal findings: Secondary | ICD-10-CM

## 2016-05-06 NOTE — H&P (Signed)
Cindy Walker is an 31 y.o. [phone] female.   Chief Complaint: Undesired fertility HPI: patient desires permanent sterility  Past Medical History:  Diagnosis Date  . Asthma    prn inhaler  . Migraine   . Umbilical hernia [id number]    Past Surgical History:  Procedure Laterality Date  . BREAST CYST EXCISION Left 2014  . CESAREAN SECTION  01/11/11; 09/27/2014  . CESAREAN SECTION N/A 04/16/2012   Procedure: Cesarean Section;  Surgeon: Guss Bunde, MD;  Location: [facility];  Service: Obstetrics;  Laterality: N/A;  Wound Class (Clean contaminated)  . INSERTION OF MESH N/A 12/01/2014   Procedure: INSERTION OF MESH;  Surgeon: Coralie Keens, MD;  Location: [facility];  Service: General;  Laterality: N/A;  . UMBILICAL HERNIA REPAIR N/A 12/01/2014   Procedure: UMBILICAL HERNIA REPAIR ;  Surgeon: Coralie Keens, MD;  Location: [facility];  Service: General;  Laterality: N/A;  . WISDOM TOOTH EXTRACTION      Family History  Problem Relation Age of Onset  . Diabetes Maternal Aunt     Type 2  . Heart disease Maternal Grandmother   . Cancer Maternal Grandfather     liver cancer  . Heart disease Maternal Grandfather   . Heart disease Paternal Grandmother   . Heart disease Paternal Grandfather   . Cleft palate Son     deceased  . Breast cancer Neg Hx    Social History:  reports that she quit smoking about 7 years ago. She smoked 0.00 packs per day. She has never used smokeless tobacco. She reports that she drinks about 1.8 oz of alcohol per week . She reports that she does not use drugs.  Allergies: No Known Allergies  No current facility-administered medications on file prior to encounter.    Current Outpatient Prescriptions on File Prior to Encounter  Medication Sig Dispense Refill  . albuterol (PROVENTIL HFA;VENTOLIN HFA) 108 (90 BASE) MCG/ACT inhaler Inhale 2 puffs into the lungs every 6 (six) hours as needed. 1 Inhaler 1  . cyclobenzaprine  (FLEXERIL) 10 MG tablet Take 10 mg by mouth 3 (three) times daily as needed for muscle spasms.    . Multiple Vitamins-Minerals (MULTIVITAMIN ADULT PO) Take 1 tablet by mouth daily.    . norgestimate-ethinyl estradiol (SPRINTEC 28) 0.25-35 MG-MCG tablet Take 1 tablet by mouth daily. 3 Package 2  . SUMAtriptan (IMITREX) 50 MG tablet Take 50 mg by mouth every 2 (two) hours as needed for migraine. May repeat in 2 hours if headache persists or recurs.      Pertinent items are noted in HPI.  There were no vitals taken for this visit. There were no vitals taken for this visit. General appearance: alert, cooperative and appears stated age Neck: supple, symmetrical, trachea midline Lungs: normal effort Heart: regular rate and rhythm Abdomen: soft, non-tender; bowel sounds normal; no masses,  no organomegaly Extremities: extremities normal, atraumatic, no cyanosis or edema Skin: Skin color, texture, turgor normal. No rashes or lesions Neurologic: Grossly normal   Lab Results  Component Value Date   WBC 8.0 07/25/2014   HGB 11.7 (L) 12/01/2014   HCT 33.9 (L) 07/25/2014   MCV 89.0 07/25/2014   PLT 259 07/25/2014   No results found for: PREGTESTUR, PREGSERUM, HCG, HCGQUANT   Assessment/Plan Principal Problem:   Encounter for sterilization  For laparoscopic salpingectomy. Consider RUQ direct visualization entry and salpingectomy for ovarian cancer risk reduction Risks include but are not limited to bleeding, infection, injury to  surrounding structures, including bowel, bladder and ureters, blood clots, and death.  Likelihood of success is high.    Cindy Walker 05/06/2016, 11:58 AM

## 2016-05-11 ENCOUNTER — Inpatient Hospital Stay (HOSPITAL_COMMUNITY)
Admission: RE | Admit: 2016-05-11 | Discharge: 2016-05-11 | Disposition: A | Discharge status: Home / Self Care | Payer: BLUE CROSS/BLUE SHIELD | Source: Ambulatory Visit

## 2016-05-16 ENCOUNTER — Encounter (HOSPITAL_COMMUNITY): Admission: RE | Payer: Self-pay | Source: Ambulatory Visit

## 2016-05-16 ENCOUNTER — Ambulatory Visit (HOSPITAL_COMMUNITY)
Admission: RE | Admit: 2016-05-16 | Payer: BLUE CROSS/BLUE SHIELD | Source: Ambulatory Visit | Admitting: Family Medicine

## 2016-05-16 SURGERY — SALPINGECTOMY, BILATERAL, LAPAROSCOPIC
Anesthesia: Choice | Site: Abdomen | Laterality: Bilateral

## 2016-05-16 SURGERY — Surgical Case
Anesthesia: *Unknown

## 2016-10-31 ENCOUNTER — Other Ambulatory Visit: Payer: Self-pay | Admitting: Family Medicine

## 2016-10-31 DIAGNOSIS — Z3041 Encounter for surveillance of contraceptive pills: Secondary | ICD-10-CM

## 2017-01-04 ENCOUNTER — Ambulatory Visit
Admission: EM | Admit: 2017-01-04 | Discharge: 2017-01-04 | Disposition: A | Discharge status: Home / Self Care | Payer: BLUE CROSS/BLUE SHIELD | Attending: Family Medicine | Admitting: Family Medicine

## 2017-01-04 DIAGNOSIS — J0100 Acute maxillary sinusitis, unspecified: Secondary | ICD-10-CM | POA: Diagnosis not present

## 2017-01-04 DIAGNOSIS — R05 Cough: Secondary | ICD-10-CM

## 2017-01-04 DIAGNOSIS — H66002 Acute suppurative otitis media without spontaneous rupture of ear drum, left ear: Secondary | ICD-10-CM

## 2017-01-04 DIAGNOSIS — R059 Cough, unspecified: Secondary | ICD-10-CM

## 2017-01-04 MED ORDER — AMOXICILLIN-POT CLAVULANATE 875-125 MG PO TABS: 1.0000 | ORAL_TABLET | Freq: Two times a day (BID) | ORAL | 0 refills | Status: AC

## 2017-01-04 NOTE — ED Provider Notes (Signed)
[facility]    CSN: [id number] Arrival date & time: 01/04/17  0903     History   Chief Complaint Chief Complaint  Patient presents with  . Facial Pain    HPI Cindy Walker is a 31 y.o. female.   The history is provided by the patient.  : 31 y/o female presented with CC of facial pain, pressure with purulent nasal drainage x 7-8 days. Productive cough with yellow expectorant x 3-4 days.Denies CP, SOB.  Left ear pain started yesterday.  Taking Mucinex, dayquil without relief in Sx.  Past Medical History:  Diagnosis Date  . Asthma    prn inhaler  . Migraine   . Umbilical hernia 11/5619    Patient Active Problem List   Diagnosis Date Noted  . Encounter for sterilization 05/06/2016  . Umbilical hernia 30/86/5784  . Left breast mass 06/04/2012  . Breast cyst 05/30/2012  . Family history of cleft palate with cleft lip 03/22/2012  . Asthma 02/24/2012  . Migraine 02/24/2012    Past Surgical History:  Procedure Laterality Date  . BREAST CYST EXCISION Left 2014  . CESAREAN SECTION  01/11/11; 09/27/2014  . CESAREAN SECTION N/A 04/16/2012   Procedure: Cesarean Section;  Surgeon: Guss Bunde, MD;  Location: [facility];  Service: Obstetrics;  Laterality: N/A;  Wound Class (Clean contaminated)  . INSERTION OF MESH N/A 12/01/2014   Procedure: INSERTION OF MESH;  Surgeon: Coralie Keens, MD;  Location: [facility];  Service: General;  Laterality: N/A;  . UMBILICAL HERNIA REPAIR N/A 12/01/2014   Procedure: UMBILICAL HERNIA REPAIR ;  Surgeon: Coralie Keens, MD;  Location: [facility];  Service: General;  Laterality: N/A;  . WISDOM TOOTH EXTRACTION      OB History    Gravida Para Term Preterm AB Living   3 3 2 1   2    SAB TAB Ectopic Multiple Live Births           3      Obstetric Comments   Son passed in 2/13-delivered c-section       Home Medications    Prior to Admission medications   Medication Sig Start Date End Date  Taking? Authorizing Provider  albuterol (PROVENTIL HFA;VENTOLIN HFA) 108 (90 BASE) MCG/ACT inhaler Inhale 2 puffs into the lungs every 6 (six) hours as needed. 02/26/14   Jaclyn Prime, Collene Leyden, PA-C  amoxicillin-clavulanate (AUGMENTIN) 875-125 MG tablet Take 1 tablet by mouth every 12 (twelve) hours. 01/04/17 01/14/17  Ajahnae Rathgeber, NP  cyclobenzaprine (FLEXERIL) 10 MG tablet Take 10 mg by mouth 3 (three) times daily as needed for muscle spasms.    [provider]  Multiple Vitamins-Minerals (MULTIVITAMIN ADULT PO) Take 1 tablet by mouth daily.    [provider]  Lucien 28 0.25-35 MG-MCG tablet TAKE 1 TABLET BY MOUTH DAILY. 10/31/16   [name], MD  SUMAtriptan (IMITREX) 50 MG tablet Take 50 mg by mouth every 2 (two) hours as needed for migraine. May repeat in 2 hours if headache persists or recurs.    [provider]    Family History Family History  Problem Relation Age of Onset  . Diabetes Maternal Aunt        Type 2  . Heart disease Maternal Grandmother   . Cancer Maternal Grandfather        liver cancer  . Heart disease Maternal Grandfather   . Heart disease Paternal Grandmother   . Heart disease Paternal Grandfather   .  Cleft palate Son        deceased  . Breast cancer Neg Hx     Social History Social History  Substance Use Topics  . Smoking status: Former Smoker    Packs/day: 0.00    Quit date: 12/19/2008  . Smokeless tobacco: Never Used  . Alcohol use 1.8 oz/week    3 Glasses of wine per week     Allergies   Patient has no known allergies.   Review of Systems Review of Systems  Constitutional: Negative.   HENT: Positive for congestion, ear pain, postnasal drip, sinus pain and sinus pressure.   Eyes: Negative.   Respiratory: Positive for cough.   Cardiovascular: Negative.   Neurological: Negative.      Physical Exam Triage Vital Signs ED Triage Vitals  Enc Vitals Group     BP 01/04/17 0915 130/79     Pulse Rate  01/04/17 0915 90     Resp 01/04/17 0915 18     Temp 01/04/17 0915 98.5 F (36.9 C)     Temp Source 01/04/17 0915 Oral     SpO2 01/04/17 0915 99 %     Weight 01/04/17 0913 185 lb (83.9 kg)     Height 01/04/17 0913 5\' 3"  (1.6 m)     Head Circumference --      Peak Flow --      Pain Score --      Pain Loc --      Pain Edu? --      Excl. in [place]? --    No data found.   Updated Vital Signs BP 130/79 (BP Location: Right Arm)   Pulse 90   Temp 98.5 F (36.9 C) (Oral)   Resp 18   Ht 5\' 3"  (1.6 m)   Wt 185 lb (83.9 kg)   LMP 12/28/2016   SpO2 99%   BMI 32.77 kg/m   Visual Acuity Right Eye Distance:   Left Eye Distance:   Bilateral Distance:    Right Eye Near:   Left Eye Near:    Bilateral Near:     Physical Exam  Constitutional: She is oriented to person, place, and time. She appears well-developed and well-nourished.  HENT:  Left Ear: Tympanic membrane is injected and bulging.  Nose: Sinus tenderness (Maxillary sinus tenderness with purulent nasal draiange ) present. Right sinus exhibits maxillary sinus tenderness. Left sinus exhibits maxillary sinus tenderness.  Cardiovascular: Normal rate and regular rhythm.   Pulmonary/Chest: Breath sounds normal. No respiratory distress. She has no wheezes. She has no rales. She exhibits no tenderness.  Reports productive cough with yellow sputum  Neurological: She is alert and oriented to person, place, and time.     UC Treatments / Results  Labs (all labs ordered are listed, but only abnormal results are displayed) Labs Reviewed - No data to display  EKG  EKG Interpretation None       Radiology No results found.  Procedures Procedures (including critical care time)  Medications Ordered in UC Medications - No data to display   Initial Impression / Assessment and Plan / UC Course  I have reviewed the triage vital signs and the nursing notes.  Pertinent labs & imaging results that were available during my care of  the patient were reviewed by me and considered in my medical decision making (see chart for details).    Saline nasal rinse as advised. Tylenol/Motrin as needed for pain/fevr. Continue Mucinex as taking  Final Clinical Impressions(s) / UC  Diagnoses   Final diagnoses:  Acute non-recurrent maxillary sinusitis  Acute suppurative otitis media of left ear without spontaneous rupture of tympanic membrane, recurrence not specified  Cough    New Prescriptions New Prescriptions   AMOXICILLIN-CLAVULANATE (AUGMENTIN) 875-125 MG TABLET    Take 1 tablet by mouth every 12 (twelve) hours.     Controlled Substance Prescriptions [place] Controlled Substance Registry consulted? Not Applicable   Teola Bradley, NP 01/04/17 [phone]

## 2017-01-04 NOTE — Discharge Instructions (Signed)
Saline nasal rinse as advised. Tylenol/Motrin as needed for ear pain and fever. Continue Mucinex Take Antibiotic with food.

## 2017-01-04 NOTE — ED Triage Notes (Signed)
Pt with one week of sinus/facial pain and congestion. Blowing yellow secretions from nose and coughing up green phlegm. Pain 7/10

## 2017-04-27 ENCOUNTER — Ambulatory Visit (INDEPENDENT_AMBULATORY_CARE_PROVIDER_SITE_OTHER): Payer: BLUE CROSS/BLUE SHIELD | Admitting: Nurse Practitioner

## 2017-04-27 ENCOUNTER — Encounter: Payer: Self-pay | Admitting: Nurse Practitioner

## 2017-04-27 VITALS — BP 115/80 | HR 77 | Resp 16 | Ht 63.0 in | Wt 190.0 lb

## 2017-04-27 DIAGNOSIS — Z124 Encounter for screening for malignant neoplasm of cervix: Secondary | ICD-10-CM

## 2017-04-27 DIAGNOSIS — R300 Dysuria: Secondary | ICD-10-CM | POA: Diagnosis not present

## 2017-04-27 DIAGNOSIS — G43109 Migraine with aura, not intractable, without status migrainosus: Secondary | ICD-10-CM

## 2017-04-27 DIAGNOSIS — Z0001 Encounter for general adult medical examination with abnormal findings: Secondary | ICD-10-CM

## 2017-04-27 DIAGNOSIS — L209 Atopic dermatitis, unspecified: Secondary | ICD-10-CM

## 2017-04-27 DIAGNOSIS — J4520 Mild intermittent asthma, uncomplicated: Secondary | ICD-10-CM

## 2017-04-27 MED ORDER — KETOROLAC TROMETHAMINE 10 MG PO TABS: 10.0000 mg | ORAL_TABLET | Freq: Three times a day (TID) | ORAL | 3 refills | Status: DC | PRN

## 2017-04-27 MED ORDER — ALBUTEROL SULFATE HFA 108 (90 BASE) MCG/ACT IN AERS: 2.0000 | INHALATION_SPRAY | Freq: Four times a day (QID) | RESPIRATORY_TRACT | 3 refills | Status: DC | PRN

## 2017-04-27 MED ORDER — TRIAMCINOLONE ACETONIDE 0.025 % EX CREA: 1.0000 "application " | TOPICAL_CREAM | Freq: Two times a day (BID) | CUTANEOUS | 3 refills | Status: DC

## 2017-04-27 NOTE — Progress Notes (Signed)
[facility] [place], [place] [address]  Internal MEDICINE  Office Visit Note  Patient Name: Cindy Walker  [id number]  [id number]  Date of Service: 05/07/2017  Chief Complaint  Patient presents with  . Headache  . Rash     Headache   This is a chronic problem. The current episode started more than 1 year ago. The problem occurs intermittently. The problem has been unchanged. The pain is located in the bilateral region. The pain radiates to the right neck, left neck and right arm. The pain quality is similar to prior headaches. The quality of the pain is described as throbbing. The pain is moderate. Associated symptoms include blurred vision, dizziness, nausea and weakness. Pertinent negatives include no back pain or vomiting. The symptoms are aggravated by menstrual cycle and fatigue. She has tried acetaminophen, darkened room, NSAIDs and triptans for the symptoms. The treatment provided moderate relief. Her past medical history is significant for migraine headaches.  Rash  This is a recurrent problem. The current episode started more than 1 month ago. The problem has been gradually improving since onset. The rash is diffuse. The rash is characterized by dryness and scaling. She was exposed to nothing. Associated symptoms include fatigue. Pertinent negatives include no congestion or vomiting. Past treatments include topical steroids and oral steroids. The treatment provided mild relief. Her past medical history is significant for eczema.   Pt is here for routine health maintenance examination  Current Medication: Outpatient Encounter Medications as of 04/27/2017  Medication Sig  . albuterol (PROVENTIL HFA;VENTOLIN HFA) 108 (90 Base) MCG/ACT inhaler Inhale 2 puffs into the lungs every 6 (six) hours as needed for wheezing.  . Multiple Vitamins-Minerals (MULTIVITAMIN ADULT PO) Take 1 tablet by mouth daily.  . SPRINTEC 28 0.25-35 MG-MCG tablet TAKE 1 TABLET BY  MOUTH DAILY.  . [DISCONTINUED] albuterol (PROVENTIL HFA;VENTOLIN HFA) 108 (90 BASE) MCG/ACT inhaler Inhale 2 puffs into the lungs every 6 (six) hours as needed.  . [DISCONTINUED] cyclobenzaprine (FLEXERIL) 10 MG tablet Take 10 mg by mouth 3 (three) times daily as needed for muscle spasms.  . [DISCONTINUED] SUMAtriptan (IMITREX) 50 MG tablet Take 50 mg by mouth every 2 (two) hours as needed for migraine. May repeat in 2 hours if headache persists or recurs.  Marland Kitchen ketorolac (TORADOL) 10 MG tablet Take 1 tablet (10 mg total) by mouth every 8 (eight) hours as needed for moderate pain.  Marland Kitchen triamcinolone (KENALOG) 0.025 % cream Apply 1 application topically 2 (two) times daily.   No facility-administered encounter medications on file as of 04/27/2017.     Surgical History: Past Surgical History:  Procedure Laterality Date  . BREAST CYST EXCISION Left 2014  . CESAREAN SECTION  01/11/11; 09/27/2014  . CESAREAN SECTION N/A 04/16/2012   Procedure: Cesarean Section;  Surgeon: Guss Bunde, MD;  Location: [facility];  Service: Obstetrics;  Laterality: N/A;  Wound Class (Clean contaminated)  . INSERTION OF MESH N/A 12/01/2014   Procedure: INSERTION OF MESH;  Surgeon: Coralie Keens, MD;  Location: [facility];  Service: General;  Laterality: N/A;  . UMBILICAL HERNIA REPAIR N/A 12/01/2014   Procedure: UMBILICAL HERNIA REPAIR ;  Surgeon: Coralie Keens, MD;  Location: [facility];  Service: General;  Laterality: N/A;  . WISDOM TOOTH EXTRACTION      Medical History: Past Medical History:  Diagnosis Date  . Asthma    prn inhaler  . Migraine   . Umbilical hernia 07/4625  Family History: Family History  Problem Relation Age of Onset  . Diabetes Maternal Aunt        Type 2  . Heart disease Maternal Grandmother   . Cancer Maternal Grandfather        liver cancer  . Heart disease Maternal Grandfather   . Heart disease Paternal Grandmother   . Heart disease Paternal  Grandfather   . Cleft palate Son        deceased  . Breast cancer Neg Hx       Review of Systems  Constitutional: Positive for fatigue. Negative for activity change and appetite change.  HENT: Negative for congestion.   Eyes: Positive for blurred vision.  Respiratory: Positive for wheezing.        Intermittent. Does need to have refill for her albuterol.   Cardiovascular: Negative for chest pain and palpitations.  Gastrointestinal: Positive for nausea. Negative for vomiting.  Genitourinary: Negative for frequency and menstrual problem.       Most recent menstrual cycle 04/19/2017  Musculoskeletal: Negative for arthralgias, back pain and myalgias.  Skin: Positive for rash.  Neurological: Positive for dizziness, weakness and headaches.  Hematological: Negative for adenopathy.     Today's Vitals   04/27/17 1413  BP: 115/80  Pulse: 77  Resp: 16  SpO2: 99%  Weight: 190 lb (86.2 kg)  Height: 5\' 3"  (1.6 m)    Physical Exam  Constitutional: She is oriented to person, place, and time. She appears well-developed and well-nourished. No distress.  HENT:  Head: Normocephalic and atraumatic.  Mouth/Throat: Oropharynx is clear and moist. No oropharyngeal exudate.  Eyes: EOM are normal. Pupils are equal, round, and reactive to light.  Neck: Normal range of motion. Neck supple. No JVD present. No tracheal deviation present. No thyromegaly present.  Cardiovascular: Normal rate, regular rhythm, normal heart sounds and intact distal pulses. Exam reveals no gallop and no friction rub.  No murmur heard. Pulmonary/Chest: Effort normal and breath sounds normal. No respiratory distress. She has no wheezes. She has no rales. She exhibits no tenderness. Right breast exhibits no inverted nipple, no mass, no nipple discharge, no skin change and no tenderness. Left breast exhibits no inverted nipple, no mass, no nipple discharge, no skin change and no tenderness.  Abdominal: Soft. Bowel sounds are  normal. There is no tenderness.  Genitourinary: No breast swelling, tenderness, discharge or bleeding. Pelvic exam was performed with patient supine. There is no rash, tenderness, lesion or injury on the right labia. There is no rash, tenderness, lesion or injury on the left labia. No erythema, tenderness or bleeding in the vagina. No foreign body in the vagina. No signs of injury around the vagina. No vaginal discharge found.  Genitourinary Comments: There is no tenderness, masses, or organomegaly present duuring bimanual exam.   Musculoskeletal: Normal range of motion.  Lymphadenopathy:    She has no cervical adenopathy.  Neurological: She is alert and oriented to person, place, and time. No cranial nerve deficit.  Skin: Skin is warm and dry. She is not diaphoretic.  Psychiatric: She has a normal mood and affect. Her behavior is normal. Judgment and thought content normal.  Nursing note and vitals reviewed.    LABS: Recent Results (from the past 2160 hour(s))  Pap IG and HPV (high risk) DNA detection     Status: None   Collection Time: 04/27/17  3:33 PM  Result Value Ref Range   DIAGNOSIS: Comment     Comment: NEGATIVE FOR INTRAEPITHELIAL LESION OR  MALIGNANCY. THIS SPECIMEN WAS RESCREENED AS PART OF OUR QUALITY CONTROL PROGRAM.    Specimen adequacy: Comment     Comment: Satisfactory for evaluation. Endocervical and/or squamous metaplastic cells (endocervical component) are present.    Clinician Provided ICD10 Comment     Comment: Z12.4   Performed by: Comment     Comment: Pat Patrick, Cytotechnologist (ASCP)   QC reviewed by: Comment     Comment: Lamar Benes, Cytotechnologist (ASCP)   PAP Smear Comment .    Note: Comment     Comment: The Pap smear is a screening test designed to aid in the detection of premalignant and malignant conditions of the uterine cervix.  It is not a diagnostic procedure and should not be used as the sole means of detecting cervical cancer.  Both  false-positive and false-negative reports do occur.    Test Methodology Comment     Comment: This liquid based ThinPrep(R) pap test was screened with the use of an image guided system.    HPV, high-risk Negative Negative    Comment: This high-risk HPV test detects thirteen high-risk types (16/18/31/33/35/39/45/51/52/56/58/59/68) without differentiation.   Urinalysis, Routine w reflex microscopic     Status: None   Collection Time: 04/27/17  3:33 PM  Result Value Ref Range   Specific Gravity, UA 1.005 1.005 - 1.030   pH, UA 7.0 5.0 - 7.5   Color, UA Yellow Yellow   Appearance Ur Clear Clear   Leukocytes, UA Negative Negative   Protein, UA Negative Negative/Trace   Glucose, UA Negative Negative   Ketones, UA Negative Negative   RBC, UA Negative Negative   Bilirubin, UA Negative Negative   Urobilinogen, Ur 0.2 0.2 - 1.0 mg/dL   Nitrite, UA Negative Negative   Microscopic Examination Comment     Comment: Microscopic not indicated and not performed.  Rapid Influenza A&B Antigens ([facility] only)     Status: None   Collection Time: 05/02/17 12:06 PM  Result Value Ref Range   Influenza A ([facility]) NEGATIVE NEGATIVE   Influenza B ([facility]) NEGATIVE NEGATIVE    Comment: Performed at [facility], [address], [place], [place] [address]    Assessment/Plan: 1. Encounter for general adult medical examination with abnormal findings Annual health maintenance exam today.  2. Migraine with aura and without status migrainosus, not intractable - ketorolac (TORADOL) 10 MG tablet; Take 1 tablet (10 mg total) by mouth every 8 (eight) hours as needed for moderate pain.  Dispense: 45 tablet; Refill: 3  3. Mild intermittent asthma without complication - albuterol (PROVENTIL HFA;VENTOLIN HFA) 108 (90 Base) MCG/ACT inhaler; Inhale 2 puffs into the lungs every 6 (six) hours as needed for wheezing.  Dispense: 1 Inhaler; Refill: 3  4. Routine cervical smear - Pap IG and HPV (high risk) DNA  detection  5. Atopic dermatitis, unspecified type - triamcinolone (KENALOG) 0.025 % cream; Apply 1 application topically 2 (two) times daily.  Dispense: 80 g; Refill: 3  6. Dysuria - Urinalysis, Routine w reflex microscopic  General Counseling: Jimi verbalizes understanding of the findings of todays visit and agrees with plan of treatment. I have discussed any further diagnostic evaluation that may be needed or ordered today. We also reviewed her medications today. she has been encouraged to call the office with any questions or concerns that should arise related to todays visit.   This patient was seen by Leretha Pol, FNP- C in Collaboration with Dr Lavera Guise as a part of collaborative care agreement  Orders Placed This Encounter  Procedures  . Urinalysis, Routine w reflex microscopic    Meds ordered this encounter  Medications  . albuterol (PROVENTIL HFA;VENTOLIN HFA) 108 (90 Base) MCG/ACT inhaler    Sig: Inhale 2 puffs into the lungs every 6 (six) hours as needed for wheezing.    Dispense:  1 Inhaler    Refill:  3    Order Specific Question:   Supervising Provider    Answer:   Lavera Guise [[id number]]  . ketorolac (TORADOL) 10 MG tablet    Sig: Take 1 tablet (10 mg total) by mouth every 8 (eight) hours as needed for moderate pain.    Dispense:  45 tablet    Refill:  3    triptans causing nausea    Order Specific Question:   Supervising Provider    Answer:   Lavera Guise [[id number]]  . triamcinolone (KENALOG) 0.025 % cream    Sig: Apply 1 application topically 2 (two) times daily.    Dispense:  80 g    Refill:  3    Order Specific Question:   Supervising Provider    Answer:   Lavera Guise [[id number]]    Time spent: Morgan Farm, MD  Internal Medicine

## 2017-04-27 NOTE — Progress Notes (Signed)
physical

## 2017-04-28 LAB — URINALYSIS, ROUTINE W REFLEX MICROSCOPIC
BILIRUBIN UA: NEGATIVE
GLUCOSE, UA: NEGATIVE
KETONES UA: NEGATIVE
LEUKOCYTES UA: NEGATIVE
NITRITE UA: NEGATIVE
Protein, UA: NEGATIVE
RBC UA: NEGATIVE
Specific Gravity, UA: 1.005 (ref 1.005–1.030)
Urobilinogen, Ur: 0.2 mg/dL (ref 0.2–1.0)
pH, UA: 7 (ref 5.0–7.5)

## 2017-05-01 LAB — PAP IG AND HPV HIGH-RISK
HPV, high-risk: NEGATIVE
PAP SMEAR COMMENT: 0

## 2017-05-02 ENCOUNTER — Ambulatory Visit
Admission: EM | Admit: 2017-05-02 | Discharge: 2017-05-02 | Disposition: A | Discharge status: Home / Self Care | Payer: BLUE CROSS/BLUE SHIELD | Attending: Family Medicine | Admitting: Family Medicine

## 2017-05-02 ENCOUNTER — Other Ambulatory Visit: Payer: Self-pay

## 2017-05-02 DIAGNOSIS — J069 Acute upper respiratory infection, unspecified: Secondary | ICD-10-CM | POA: Diagnosis not present

## 2017-05-02 DIAGNOSIS — B9789 Other viral agents as the cause of diseases classified elsewhere: Secondary | ICD-10-CM

## 2017-05-02 DIAGNOSIS — R69 Illness, unspecified: Secondary | ICD-10-CM

## 2017-05-02 DIAGNOSIS — J111 Influenza due to unidentified influenza virus with other respiratory manifestations: Secondary | ICD-10-CM

## 2017-05-02 DIAGNOSIS — R05 Cough: Secondary | ICD-10-CM | POA: Diagnosis not present

## 2017-05-02 LAB — RAPID INFLUENZA A&B ANTIGENS
Influenza A (ARMC): NEGATIVE
Influenza B (ARMC): NEGATIVE

## 2017-05-02 MED ORDER — OSELTAMIVIR PHOSPHATE 75 MG PO CAPS: 75.0000 mg | ORAL_CAPSULE | Freq: Two times a day (BID) | ORAL | 0 refills | Status: DC

## 2017-05-02 NOTE — ED Triage Notes (Signed)
Patient complains of cough, congestion, fever, body aches. Patient states that she started feeling bad (more sinus like on Friday) symptoms worsened last night with fevers.

## 2017-05-02 NOTE — ED Provider Notes (Addendum)
[facility] ____________________________________________  Time seen: Approximately 2:42 PM  I have reviewed the triage vital signs and the nursing notes.   HISTORY  Chief Complaint Cough (APPT)   HPI Cindy Walker is a 32 y.o. female presenting for evaluation of runny nose, nasal congestion, cough, chills, body aches that started on Sunday.  Patient reports last Wednesday she had some runny nose and congestion as well at that had mostly resolved going into the weekend.  States that she felt fine.  Reports Sunday evening quick onset of body aches, subjective fever and return of cough and congestion.  Denies known sick contacts at home.  Reports has continued to eat and drink well.  Has been taken over-the-counter cough and congestion medications with Tylenol and ibuprofen.  Has had some intermittent maxillary sinus pressure, no persistent sinus pressure sensation.  Denies other aggravating or alleviating factors. Denies chest pain, shortness of breath, abdominal pain, dysuria, extremity pain, extremity swelling or rash. Denies recent sickness. Denies recent antibiotic use.   Dewayne Shorter, MD: PCP Patient's last menstrual period was 04/19/2017.Denies pregnancy.    Past Medical History:  Diagnosis Date  . Asthma    prn inhaler  . Migraine   . Umbilical hernia 04/5850    Patient Active Problem List   Diagnosis Date Noted  . Encounter for sterilization 05/06/2016  . Umbilical hernia 77/82/4235  . Left breast mass 06/04/2012  . Breast cyst 05/30/2012  . Family history of cleft palate with cleft lip 03/22/2012  . Asthma 02/24/2012  . Migraine 02/24/2012    Past Surgical History:  Procedure Laterality Date  . BREAST CYST EXCISION Left 2014  . CESAREAN SECTION  01/11/11; 09/27/2014  . CESAREAN SECTION N/A 04/16/2012   Procedure: Cesarean Section;  Surgeon: Guss Bunde, MD;  Location: [facility];  Service: Obstetrics;  Laterality: N/A;  Wound Class (Clean  contaminated)  . INSERTION OF MESH N/A 12/01/2014   Procedure: INSERTION OF MESH;  Surgeon: Coralie Keens, MD;  Location: [facility];  Service: General;  Laterality: N/A;  . UMBILICAL HERNIA REPAIR N/A 12/01/2014   Procedure: UMBILICAL HERNIA REPAIR ;  Surgeon: Coralie Keens, MD;  Location: [facility];  Service: General;  Laterality: N/A;  . WISDOM TOOTH EXTRACTION       No current facility-administered medications for this encounter.   Current Outpatient Medications:  .  albuterol (PROVENTIL HFA;VENTOLIN HFA) 108 (90 Base) MCG/ACT inhaler, Inhale 2 puffs into the lungs every 6 (six) hours as needed for wheezing., Disp: 1 Inhaler, Rfl: 3 .  ketorolac (TORADOL) 10 MG tablet, Take 1 tablet (10 mg total) by mouth every 8 (eight) hours as needed for moderate pain., Disp: 45 tablet, Rfl: 3 .  Multiple Vitamins-Minerals (MULTIVITAMIN ADULT PO), Take 1 tablet by mouth daily., Disp: , Rfl:  .  SPRINTEC 28 0.25-35 MG-MCG tablet, TAKE 1 TABLET BY MOUTH DAILY., Disp: 84 tablet, Rfl: 2 .  triamcinolone (KENALOG) 0.025 % cream, Apply 1 application topically 2 (two) times daily., Disp: 80 g, Rfl: 3 .  oseltamivir (TAMIFLU) 75 MG capsule, Take 1 capsule (75 mg total) by mouth every 12 (twelve) hours., Disp: 10 capsule, Rfl: 0  Allergies Patient has no known allergies.  Family History  Problem Relation Age of Onset  . Diabetes Maternal Aunt        Type 2  . Heart disease Maternal Grandmother   . Cancer Maternal Grandfather        liver cancer  .  Heart disease Maternal Grandfather   . Heart disease Paternal Grandmother   . Heart disease Paternal Grandfather   . Cleft palate Son        deceased  . Breast cancer Neg Hx     Social History Social History   Tobacco Use  . Smoking status: Former Smoker    Packs/day: 0.00    Last attempt to quit: 12/19/2008    Years since quitting: 8.3  . Smokeless tobacco: Never Used  Substance Use Topics  . Alcohol use: Yes      Alcohol/week: 1.8 oz    Types: 3 Glasses of wine per week  . Drug use: No    Review of Systems Constitutional:As above.  Eyes: No visual changes. ENT: No sore throat. Cardiovascular: Denies chest pain. Respiratory: Denies shortness of breath. Gastrointestinal: No abdominal pain.   Musculoskeletal: Negative for back pain. Skin: Negative for rash.   ____________________________________________   PHYSICAL EXAM:  VITAL SIGNS: ED Triage Vitals  Enc Vitals Group     BP 05/02/17 1135 126/71     Pulse Rate 05/02/17 1135 88     Resp 05/02/17 1135 18     Temp 05/02/17 1135 98.6 F (37 C)     Temp Source 05/02/17 1135 Oral     SpO2 05/02/17 1135 100 %     Weight 05/02/17 1133 190 lb (86.2 kg)     Height 05/02/17 1133 5\' 3"  (1.6 m)     Head Circumference --      Peak Flow --      Pain Score 05/02/17 1133 8     Pain Loc --      Pain Edu? --      Excl. in [place]? --     Constitutional: Alert and oriented. Well appearing and in no acute distress. Eyes: Conjunctivae are normal. Head: Atraumatic. No sinus tenderness to palpation. No swelling. No erythema.  Ears: no erythema, normal TMs bilaterally.   Nose:Nasal congestion with clear rhinorrhea  Mouth/Throat: Mucous membranes are moist. No pharyngeal erythema. No tonsillar swelling or exudate.  Neck: No stridor.  No cervical spine tenderness to palpation. Hematological/Lymphatic/Immunilogical: No cervical lymphadenopathy. Cardiovascular: Normal rate, regular rhythm. Grossly normal heart sounds.  Good peripheral circulation. Respiratory: Normal respiratory effort.  No retractions. No wheezes, rales or rhonchi. Good air movement.  Musculoskeletal: Ambulatory with steady gait.  Neurologic:  Normal speech and language. No gait instability. Skin:  Skin appears warm, dry and intact. No rash noted. Psychiatric: Mood and affect are normal. Speech and behavior are normal.  ___________________________________________   LABS (all labs  ordered are listed, but only abnormal results are displayed)  Labs Reviewed  RAPID INFLUENZA A&B ANTIGENS ([place])    PROCEDURES Procedures    INITIAL IMPRESSION / ASSESSMENT AND PLAN / ED COURSE  Pertinent labs & imaging results that were available during my care of the patient were reviewed by me and considered in my medical decision making (see chart for details).  Well-appearing patient.  No acute distress.  Suspect viral illness.  Suspect patient had a viral URI last week and was resolving.  However as patient describes from this past Sunday, suspect secondary viral such as influenza.  Well-appearing patient.  Patient requested flu swab, however discussed suspect influenza.  Discussed use of Tamiflu as well as distinguishing timeframe.  Patient reports the symptoms started Sunday, Rx given.  Encourage rest, fluids, supportive care.Discussed indication, risks and benefits of medications with patient.  Discussed follow up with Primary care  physician this week. Discussed follow up and return parameters including no resolution or any worsening concerns. Patient verbalized understanding and agreed to plan.   ____________________________________________   FINAL CLINICAL IMPRESSION(S) / ED DIAGNOSES  Final diagnoses:  Viral URI with cough  Influenza-like illness     ED Discharge Orders        Ordered    oseltamivir (TAMIFLU) 75 MG capsule  Every 12 hours     05/02/17 1215       Note: This dictation was prepared with Dragon dictation along with smaller phrase technology. Any transcriptional errors that result from this process are unintentional.           Marylene Land, NP 05/02/17 1451

## 2017-05-02 NOTE — Discharge Instructions (Signed)
Take medication as prescribed. Rest. Drink plenty of fluids.  ° °Follow up with your primary care physician this week as needed. Return to Urgent care for new or worsening concerns.  ° °

## 2017-06-30 ENCOUNTER — Other Ambulatory Visit: Payer: Self-pay

## 2017-06-30 DIAGNOSIS — Z3041 Encounter for surveillance of contraceptive pills: Secondary | ICD-10-CM

## 2017-06-30 MED ORDER — NORGESTIMATE-ETH ESTRADIOL 0.25-35 MG-MCG PO TABS
1.0000 | ORAL_TABLET | Freq: Every day | ORAL | 2 refills | Status: DC
Start: 2017-06-30 — End: 2018-05-25

## 2017-06-30 NOTE — Telephone Encounter (Signed)
Patient requested sprintec be called in to her pharmacy.

## 2017-10-30 ENCOUNTER — Ambulatory Visit: Payer: BLUE CROSS/BLUE SHIELD | Admitting: Nurse Practitioner

## 2017-10-30 ENCOUNTER — Encounter: Payer: Self-pay | Admitting: Nurse Practitioner

## 2017-10-30 VITALS — BP 121/79 | HR 83 | Resp 16 | Ht 63.0 in | Wt 174.2 lb

## 2017-10-30 DIAGNOSIS — G43109 Migraine with aura, not intractable, without status migrainosus: Secondary | ICD-10-CM

## 2017-10-30 DIAGNOSIS — J4520 Mild intermittent asthma, uncomplicated: Secondary | ICD-10-CM | POA: Diagnosis not present

## 2017-10-30 MED ORDER — TOPIRAMATE 25 MG PO TABS: 25.0000 mg | ORAL_TABLET | Freq: Every day | ORAL | 2 refills | Status: DC

## 2017-10-30 NOTE — Progress Notes (Signed)
[facility], [place] [address]  Internal MEDICINE  Office Visit Note  Patient Name: Cindy Walker  [id number]  [id number]  Date of Service: 10/30/2017  Chief Complaint  Patient presents with  . Headache    6 month follow up  . Rash    resolved     Patient continues to get frequent headaches. States that she is getting 4 to 5 headaches per week. Having to take a lot of ibuprofen or Toradol to relieve the headaches. Will also take flexeril at times to help associated neck pain. Has never been on preventive medications for headaches/migraines.        Current Medication: Outpatient Encounter Medications as of 10/30/2017  Medication Sig  . albuterol (PROVENTIL HFA;VENTOLIN HFA) 108 (90 Base) MCG/ACT inhaler Inhale 2 puffs into the lungs every 6 (six) hours as needed for wheezing.  Marland Kitchen ketorolac (TORADOL) 10 MG tablet Take 1 tablet (10 mg total) by mouth every 8 (eight) hours as needed for moderate pain.  . Multiple Vitamins-Minerals (MULTIVITAMIN ADULT PO) Take 1 tablet by mouth daily.  Marland Kitchen triamcinolone (KENALOG) 0.025 % cream Apply 1 application topically 2 (two) times daily.  . norgestimate-ethinyl estradiol (SPRINTEC 28) 0.25-35 MG-MCG tablet Take 1 tablet by mouth daily. (Patient not taking: Reported on 10/30/2017)  . oseltamivir (TAMIFLU) 75 MG capsule Take 1 capsule (75 mg total) by mouth every 12 (twelve) hours. (Patient not taking: Reported on 10/30/2017)  . topiramate (TOPAMAX) 25 MG tablet Take 1 tablet (25 mg total) by mouth at bedtime.   No facility-administered encounter medications on file as of 10/30/2017.     Surgical History: Past Surgical History:  Procedure Laterality Date  . BREAST CYST EXCISION Left 2014  . CESAREAN SECTION  01/11/11; 09/27/2014  . CESAREAN SECTION N/A 04/16/2012   Procedure: Cesarean Section;  Surgeon: Guss Bunde, MD;  Location: [facility];  Service: Obstetrics;  Laterality: N/A;  Wound Class (Clean  contaminated)  . INSERTION OF MESH N/A 12/01/2014   Procedure: INSERTION OF MESH;  Surgeon: Coralie Keens, MD;  Location: [facility];  Service: General;  Laterality: N/A;  . UMBILICAL HERNIA REPAIR N/A 12/01/2014   Procedure: UMBILICAL HERNIA REPAIR ;  Surgeon: Coralie Keens, MD;  Location: [facility];  Service: General;  Laterality: N/A;  . WISDOM TOOTH EXTRACTION      Medical History: Past Medical History:  Diagnosis Date  . Asthma    prn inhaler  . Migraine   . Umbilical hernia 03/2456    Family History: Family History  Problem Relation Age of Onset  . Diabetes Maternal Aunt        Type 2  . Heart disease Maternal Grandmother   . Cancer Maternal Grandfather        liver cancer  . Heart disease Maternal Grandfather   . Heart disease Paternal Grandmother   . Heart disease Paternal Grandfather   . Cleft palate Son        deceased  . Breast cancer Neg Hx     Social History   Socioeconomic History  . Marital status: Married    Spouse name: Not on file  . Number of children: Not on file  . Years of education: Not on file  . Highest education level: Not on file  Occupational History  . Not on file  Social Needs  . Financial resource strain: Not on file  . Food insecurity:    Worry: Not on file  Inability: Not on file  . Transportation needs:    Medical: Not on file    Non-medical: Not on file  Tobacco Use  . Smoking status: Former Smoker    Packs/day: 0.00    Last attempt to quit: 12/19/2008    Years since quitting: 8.8  . Smokeless tobacco: Never Used  Substance and Sexual Activity  . Alcohol use: Yes    Alcohol/week: 3.0 standard drinks    Types: 3 Glasses of wine per week    Comment: occasionally  . Drug use: No  . Sexual activity: Yes    Partners: Male    Birth control/protection: OCP, Pill  Lifestyle  . Physical activity:    Days per week: Not on file    Minutes per session: Not on file  . Stress: Not on file   Relationships  . Social connections:    Talks on phone: Not on file    Gets together: Not on file    Attends religious service: Not on file    Active member of club or organization: Not on file    Attends meetings of clubs or organizations: Not on file    Relationship status: Not on file  . Intimate partner violence:    Fear of current or ex partner: Not on file    Emotionally abused: Not on file    Physically abused: Not on file    Forced sexual activity: Not on file  Other Topics Concern  . Not on file  Social History Narrative  . Not on file      Review of Systems  Constitutional: Positive for fatigue. Negative for activity change and appetite change.  HENT: Negative for congestion.   Eyes: Negative.   Respiratory: Negative for cough and wheezing.   Cardiovascular: Negative for chest pain and palpitations.  Gastrointestinal: Positive for nausea. Negative for vomiting.  Endocrine: Negative for cold intolerance, heat intolerance, polydipsia, polyphagia and polyuria.  Genitourinary: Negative for frequency and menstrual problem.  Musculoskeletal: Negative for arthralgias, back pain and myalgias.  Skin: Negative for rash.  Allergic/Immunologic: Negative for environmental allergies.  Neurological: Positive for dizziness and headaches. Negative for weakness.       Getting 4 to 5 migraine headaches per week.   Hematological: Negative for adenopathy.  Psychiatric/Behavioral: Negative for dysphoric mood and sleep disturbance.    Vital Signs: BP 121/79 (BP Location: Left Arm, Patient Position: Sitting, Cuff Size: Large)   Pulse 83   Resp 16   Ht 5\' 3"  (1.6 m)   Wt 174 lb 3.2 oz (79 kg)   SpO2 100%   BMI 30.86 kg/m    Physical Exam  Constitutional: She is oriented to person, place, and time. She appears well-developed and well-nourished. No distress.  HENT:  Head: Normocephalic and atraumatic.  Mouth/Throat: Oropharynx is clear and moist. No oropharyngeal exudate.   Eyes: Pupils are equal, round, and reactive to light. EOM are normal.  Neck: Normal range of motion. Neck supple. No JVD present. No tracheal deviation present. No thyromegaly present.  Cardiovascular: Normal rate, regular rhythm and normal heart sounds. Exam reveals no gallop and no friction rub.  No murmur heard. Pulmonary/Chest: Effort normal and breath sounds normal. No respiratory distress. She has no wheezes. She has no rales. She exhibits no tenderness.  Abdominal: Soft. Bowel sounds are normal. There is no tenderness.  Musculoskeletal: Normal range of motion.  Lymphadenopathy:    She has no cervical adenopathy.  Neurological: She is alert and oriented to person,  place, and time. She has normal strength. No cranial nerve deficit. She displays a negative Romberg sign.  Skin: Skin is warm and dry. Capillary refill takes less than 2 seconds. She is not diaphoretic.  Psychiatric: She has a normal mood and affect. Her behavior is normal. Judgment and thought content normal.  Nursing note and vitals reviewed.  Assessment/Plan: 1. Migraine with aura and without status migrainosus, not intractable Increasing in frequency and severity. Start topamax 25mg  at bedtime to reduce frequency and severity of migraine headaches. May continue to use toradol, fioricet, and flexeril as needed and as prescribed for acute headaches.  - topiramate (TOPAMAX) 25 MG tablet; Take 1 tablet (25 mg total) by mouth at bedtime.  Dispense: 30 tablet; Refill: 2  2. Mild intermittent asthma without complication Rescue inhaler as needed and as prescribed.   General Counseling: mikaya bunner understanding of the findings of todays visit and agrees with plan of treatment. I have discussed any further diagnostic evaluation that may be needed or ordered today. We also reviewed her medications today. she has been encouraged to call the office with any questions or concerns that should arise related to todays  visit.   This patient was seen by [place] with Dr Lavera Guise as a part of collaborative care agreement  Meds ordered this encounter  Medications  . topiramate (TOPAMAX) 25 MG tablet    Sig: Take 1 tablet (25 mg total) by mouth at bedtime.    Dispense:  30 tablet    Refill:  2    Order Specific Question:   Supervising Provider    Answer:   Lavera Guise [[id number]]    Time spent: 70 Minutes      Dr Lavera Guise Internal medicine

## 2017-12-12 ENCOUNTER — Ambulatory Visit: Payer: Self-pay | Admitting: Nurse Practitioner

## 2018-01-23 ENCOUNTER — Other Ambulatory Visit: Payer: Self-pay | Admitting: Nurse Practitioner

## 2018-01-23 DIAGNOSIS — G43109 Migraine with aura, not intractable, without status migrainosus: Secondary | ICD-10-CM

## 2018-03-01 ENCOUNTER — Encounter: Payer: Self-pay | Admitting: Nurse Practitioner

## 2018-03-01 ENCOUNTER — Ambulatory Visit: Payer: BLUE CROSS/BLUE SHIELD | Admitting: Nurse Practitioner

## 2018-03-01 VITALS — BP 126/77 | HR 94 | Temp 98.6°F | Resp 16 | Ht 63.0 in | Wt 190.8 lb

## 2018-03-01 DIAGNOSIS — J029 Acute pharyngitis, unspecified: Secondary | ICD-10-CM | POA: Diagnosis not present

## 2018-03-01 DIAGNOSIS — B370 Candidal stomatitis: Secondary | ICD-10-CM

## 2018-03-01 MED ORDER — NYSTATIN 100000 UNIT/ML MT SUSP: 5.0000 mL | Freq: Four times a day (QID) | OROMUCOSAL | 1 refills | Status: DC

## 2018-03-01 MED ORDER — AZITHROMYCIN 250 MG PO TABS: ORAL_TABLET | ORAL | 0 refills | Status: DC

## 2018-03-01 NOTE — Progress Notes (Signed)
[facility] [place], [place] [address]  Internal MEDICINE  Office Visit Note  Patient Name: Cindy Walker  [id number]  [id number]  Date of Service: 03/04/2018   Pt is here for a sick visit.  Chief Complaint  Patient presents with  . Sore Throat    has been on two rounds of antibiotics and thinks it could have contributed to the irritation, redness, swelling, pt stated back of throat is really red, uncomfortable to swallow     The patient is here for sick visit. Today, she is complaining of sore throat, mostly when she swallows or eats. Lymph nodes are swollen in her neck. She was recently treated, twice for sinusitis. She was initially treated with augmentin twice daily for 7 days. Felt better for 3 or 4 days, but symptoms came right back after she finished antibiotics. [facility] was then seen at [facility], and treated for recurrent sinusitis. Was put on doxycycline twice daily for 10 days. The last day of antibiotics was Tuesday. After that, she developed severe sore throat with difficulty swallowing. No fever. No nausea or vomiting. She does have a mild headache.        Current Medication:  Outpatient Encounter Medications as of 03/01/2018  Medication Sig  . albuterol (PROVENTIL HFA;VENTOLIN HFA) 108 (90 Base) MCG/ACT inhaler Inhale 2 puffs into the lungs every 6 (six) hours as needed for wheezing.  Marland Kitchen ketorolac (TORADOL) 10 MG tablet Take 1 tablet (10 mg total) by mouth every 8 (eight) hours as needed for moderate pain.  . Multiple Vitamins-Minerals (MULTIVITAMIN ADULT PO) Take 1 tablet by mouth daily.  Marland Kitchen topiramate (TOPAMAX) 25 MG tablet TAKE 1 TABLET BY MOUTH EVERYDAY AT BEDTIME  . triamcinolone (KENALOG) 0.025 % cream Apply 1 application topically 2 (two) times daily.  Marland Kitchen azithromycin (ZITHROMAX) 250 MG tablet z-pack - take as directed for 5 days  . norgestimate-ethinyl estradiol (SPRINTEC 28) 0.25-35 MG-MCG tablet Take 1 tablet by mouth daily.  (Patient not taking: Reported on 10/30/2017)  . nystatin (MYCOSTATIN) 100000 UNIT/ML suspension Take 5 mLs (500,000 Units total) by mouth 4 (four) times daily.  Marland Kitchen oseltamivir (TAMIFLU) 75 MG capsule Take 1 capsule (75 mg total) by mouth every 12 (twelve) hours. (Patient not taking: Reported on 10/30/2017)   No facility-administered encounter medications on file as of 03/01/2018.       Medical History: Past Medical History:  Diagnosis Date  . Asthma    prn inhaler  . Migraine   . Umbilical hernia 09/348     Today's Vitals   03/01/18 1439  BP: 126/77  Pulse: 94  Resp: 16  Temp: 98.6 F (37 C)  SpO2: 99%  Weight: 190 lb 12.8 oz (86.5 kg)  Height: 5\' 3"  (1.6 m)    Review of Systems  Constitutional: Positive for fatigue. Negative for activity change, chills and fever.  HENT: Positive for congestion, rhinorrhea, sore throat and voice change. Negative for ear pain, postnasal drip and sinus pain.   Respiratory: Positive for cough. Negative for wheezing.   Cardiovascular: Negative for chest pain and palpitations.  Gastrointestinal: Positive for nausea.  Musculoskeletal: Positive for myalgias.  Skin: Negative.   Allergic/Immunologic: Positive for environmental allergies.  Neurological: Positive for headaches.  Hematological: Positive for adenopathy.  Psychiatric/Behavioral: Negative.     Physical Exam Vitals signs and nursing note reviewed.  Constitutional:      General: She is not in acute distress.    Appearance: Normal appearance. She is well-developed. She  is ill-appearing. She is not diaphoretic.  HENT:     Head: Normocephalic and atraumatic.     Right Ear: External ear normal.     Left Ear: External ear normal.     Nose: Nose normal.     Mouth/Throat:     Pharynx: Oropharyngeal exudate and posterior oropharyngeal erythema present.  Eyes:     Pupils: Pupils are equal, round, and reactive to light.  Neck:     Musculoskeletal: Normal range of motion and neck supple.      Thyroid: No thyromegaly.     Vascular: No JVD.     Trachea: No tracheal deviation.  Cardiovascular:     Rate and Rhythm: Normal rate and regular rhythm.     Heart sounds: Normal heart sounds. No murmur. No friction rub. No gallop.   Pulmonary:     Effort: Pulmonary effort is normal. No respiratory distress.     Breath sounds: Normal breath sounds. No wheezing or rales.  Chest:     Chest wall: No tenderness.  Abdominal:     General: Bowel sounds are normal.     Palpations: Abdomen is soft.  Musculoskeletal: Normal range of motion.  Lymphadenopathy:     Cervical: Cervical adenopathy present.  Skin:    General: Skin is warm and dry.  Neurological:     Mental Status: She is alert and oriented to person, place, and time.     Cranial Nerves: No cranial nerve deficit.  Psychiatric:        Mood and Affect: Mood normal.        Behavior: Behavior normal.        Thought Content: Thought content normal.        Judgment: Judgment normal.   Assessment/Plan: 1. Acute sore throat Significant sore throat after multiple rounds antibiotics. Will try round z-pack. Take as directed for 5 days. Recommend warm salt water gargles as needed to help reduce sore throat. Samples cepacol throat lozenges given today. Use other OTC medications as needed and as indicated to alleviate symptoms.  - azithromycin (ZITHROMAX) 250 MG tablet; z-pack - take as directed for 5 days  Dispense: 6 tablet; Refill: 0  2. Oral candidiasis No evidence of thrush noted today, however, due to multiple rounds antibiotics, will treat with nystatin suspension. Swish and swallow four times daily for 7 to 10 days.  - nystatin (MYCOSTATIN) 100000 UNIT/ML suspension; Take 5 mLs (500,000 Units total) by mouth 4 (four) times daily.  Dispense: 200 mL; Refill: 1  General Counseling: Dayrin verbalizes understanding of the findings of todays visit and agrees with plan of treatment. I have discussed any further diagnostic evaluation that  may be needed or ordered today. We also reviewed her medications today. she has been encouraged to call the office with any questions or concerns that should arise related to todays visit.    Counseling:  Rest and increase fluids. Continue using OTC medication to control symptoms.   This patient was seen by [place] with Dr Lavera Guise as a part of collaborative care agreement  Meds ordered this encounter  Medications  . nystatin (MYCOSTATIN) 100000 UNIT/ML suspension    Sig: Take 5 mLs (500,000 Units total) by mouth 4 (four) times daily.    Dispense:  200 mL    Refill:  1    Order Specific Question:   Supervising Provider    Answer:   Lavera Guise [[id number]]  . azithromycin (ZITHROMAX) 250 MG tablet  Sig: z-pack - take as directed for 5 days    Dispense:  6 tablet    Refill:  0    Order Specific Question:   Supervising Provider    Answer:   Lavera Guise [[id number]]    Time spent: 15 Minutes

## 2018-03-04 DIAGNOSIS — J029 Acute pharyngitis, unspecified: Secondary | ICD-10-CM | POA: Insufficient documentation

## 2018-03-04 DIAGNOSIS — B370 Candidal stomatitis: Secondary | ICD-10-CM | POA: Insufficient documentation

## 2018-04-24 ENCOUNTER — Encounter: Payer: Self-pay | Admitting: Nurse Practitioner

## 2018-05-25 ENCOUNTER — Encounter: Payer: Self-pay | Admitting: Nurse Practitioner

## 2018-05-25 ENCOUNTER — Ambulatory Visit: Payer: BLUE CROSS/BLUE SHIELD | Admitting: Nurse Practitioner

## 2018-05-25 VITALS — BP 115/72 | HR 60 | Resp 16 | Ht 63.0 in | Wt 195.8 lb

## 2018-05-25 DIAGNOSIS — G43109 Migraine with aura, not intractable, without status migrainosus: Secondary | ICD-10-CM | POA: Diagnosis not present

## 2018-05-25 DIAGNOSIS — Z0001 Encounter for general adult medical examination with abnormal findings: Secondary | ICD-10-CM

## 2018-05-25 DIAGNOSIS — R635 Abnormal weight gain: Secondary | ICD-10-CM | POA: Diagnosis not present

## 2018-05-25 DIAGNOSIS — R300 Dysuria: Secondary | ICD-10-CM | POA: Diagnosis not present

## 2018-05-25 MED ORDER — KETOROLAC TROMETHAMINE 10 MG PO TABS: 10.0000 mg | ORAL_TABLET | Freq: Three times a day (TID) | ORAL | 3 refills | Status: DC | PRN

## 2018-05-25 NOTE — Progress Notes (Signed)
[facility] [place], [place] [address]  Internal MEDICINE  Office Visit Note  Patient Name: Cindy Walker  [id number]  [id number]  Date of Service: 06/09/2018    Pt is here for routine health maintenance examination  Chief Complaint  Patient presents with  . Annual Exam    pt inquirinig about weight loss  . Asthma  . Migraine     The patient is here for health maintenance exam. She has history of migraine headaches. Is taking topamax 25mg  at bedtime which has helped to limit the frequency and severity of migraine headaches. She will generally take toradol when needed to relieve acute migraines and this continues to work well for her.  She is concerned about weight gain. She has gained about 5 pounds since she was most recently seen. She states that she is carefully monitoring her diet and exercises three to four times per week. She is finding it increasingly difficult to lose weight. She is due to have routine fasting labs drawn.    Current Medication: Outpatient Encounter Medications as of 05/25/2018  Medication Sig Note  . albuterol (PROVENTIL HFA;VENTOLIN HFA) 108 (90 Base) MCG/ACT inhaler Inhale 2 puffs into the lungs every 6 (six) hours as needed for wheezing.   Marland Kitchen ketorolac (TORADOL) 10 MG tablet Take 1 tablet (10 mg total) by mouth every 8 (eight) hours as needed for moderate pain.   . Multiple Vitamins-Minerals (MULTIVITAMIN ADULT PO) Take 1 tablet by mouth daily.   Marland Kitchen nystatin (MYCOSTATIN) 100000 UNIT/ML suspension Take 5 mLs (500,000 Units total) by mouth 4 (four) times daily.   Marland Kitchen triamcinolone (KENALOG) 0.025 % cream Apply 1 application topically 2 (two) times daily.   . [DISCONTINUED] azithromycin (ZITHROMAX) 250 MG tablet z-pack - take as directed for 5 days   . [DISCONTINUED] ketorolac (TORADOL) 10 MG tablet Take 1 tablet (10 mg total) by mouth every 8 (eight) hours as needed for moderate pain.   . [DISCONTINUED] norgestimate-ethinyl  estradiol (SPRINTEC 28) 0.25-35 MG-MCG tablet Take 1 tablet by mouth daily. (Patient not taking: Reported on 10/30/2017)   . [DISCONTINUED] oseltamivir (TAMIFLU) 75 MG capsule Take 1 capsule (75 mg total) by mouth every 12 (twelve) hours. (Patient not taking: Reported on 10/30/2017)   . [DISCONTINUED] topiramate (TOPAMAX) 25 MG tablet TAKE 1 TABLET BY MOUTH EVERYDAY AT BEDTIME 05/25/2018: patient is not taking    No facility-administered encounter medications on file as of 05/25/2018.     Surgical History: Past Surgical History:  Procedure Laterality Date  . BREAST CYST EXCISION Left 2014  . CESAREAN SECTION  01/11/11; 09/27/2014  . CESAREAN SECTION N/A 04/16/2012   Procedure: Cesarean Section;  Surgeon: Guss Bunde, MD;  Location: [facility];  Service: Obstetrics;  Laterality: N/A;  Wound Class (Clean contaminated)  . INSERTION OF MESH N/A 12/01/2014   Procedure: INSERTION OF MESH;  Surgeon: Coralie Keens, MD;  Location: [facility];  Service: General;  Laterality: N/A;  . UMBILICAL HERNIA REPAIR N/A 12/01/2014   Procedure: UMBILICAL HERNIA REPAIR ;  Surgeon: Coralie Keens, MD;  Location: [facility];  Service: General;  Laterality: N/A;  . WISDOM TOOTH EXTRACTION      Medical History: Past Medical History:  Diagnosis Date  . Asthma    prn inhaler  . Migraine   . Umbilical hernia 08/9676    Family History: Family History  Problem Relation Age of Onset  . Diabetes Maternal Aunt  Type 2  . Heart disease Maternal Grandmother   . Cancer Maternal Grandfather        liver cancer  . Heart disease Maternal Grandfather   . Heart disease Paternal Grandmother   . Heart disease Paternal Grandfather   . Cleft palate Son        deceased  . Breast cancer Neg Hx       Review of Systems  Constitutional: Positive for fatigue. Negative for chills and unexpected weight change.       Five pound weight gain since her last visit  HENT: Negative for congestion,  postnasal drip, rhinorrhea, sneezing and sore throat.   Respiratory: Negative for cough, chest tightness, shortness of breath and wheezing.   Cardiovascular: Negative for chest pain and palpitations.  Gastrointestinal: Negative for abdominal pain, constipation, diarrhea, nausea and vomiting.  Endocrine: Negative for cold intolerance, heat intolerance, polydipsia and polyuria.  Genitourinary: Negative for dysuria, frequency and menstrual problem.  Musculoskeletal: Negative for arthralgias, back pain, joint swelling and neck pain.  Skin: Negative for rash.  Neurological: Positive for headaches. Negative for tremors and numbness.  Hematological: Negative for adenopathy. Does not bruise/bleed easily.  Psychiatric/Behavioral: Negative for behavioral problems (Depression), sleep disturbance and suicidal ideas. The patient is not nervous/anxious.      Vital Signs: BP 115/72 (BP Location: Right Arm, Patient Position: Sitting, Cuff Size: Large)   Pulse 60   Resp 16   Ht 5\' 3"  (1.6 m)   Wt 195 lb 12.8 oz (88.8 kg)   LMP 05/13/2018   SpO2 100%   BMI 34.68 kg/m    Physical Exam Vitals signs and nursing note reviewed.  Constitutional:      General: She is not in acute distress.    Appearance: Normal appearance. She is well-developed. She is not diaphoretic.  HENT:     Head: Normocephalic and atraumatic.     Mouth/Throat:     Pharynx: No oropharyngeal exudate.  Eyes:     Conjunctiva/sclera: Conjunctivae normal.     Pupils: Pupils are equal, round, and reactive to light.  Neck:     Musculoskeletal: Normal range of motion and neck supple.     Thyroid: No thyromegaly.     Vascular: No JVD.     Trachea: No tracheal deviation.  Cardiovascular:     Rate and Rhythm: Normal rate and regular rhythm.     Pulses: Normal pulses.     Heart sounds: Normal heart sounds. No murmur. No friction rub. No gallop.   Pulmonary:     Effort: Pulmonary effort is normal. No respiratory distress.     Breath  sounds: No wheezing or rales.  Chest:     Chest wall: No tenderness.     Breasts:        Right: Normal. No swelling, bleeding, inverted nipple, mass, nipple discharge, skin change or tenderness.        Left: Normal. No swelling, bleeding, inverted nipple, mass, nipple discharge, skin change or tenderness.  Abdominal:     General: Bowel sounds are normal.     Palpations: Abdomen is soft.  Musculoskeletal: Normal range of motion.  Lymphadenopathy:     Cervical: No cervical adenopathy.  Skin:    General: Skin is warm and dry.  Neurological:     Mental Status: She is alert and oriented to person, place, and time.     Cranial Nerves: No cranial nerve deficit.  Psychiatric:        Behavior: Behavior normal.  Thought Content: Thought content normal.        Judgment: Judgment normal.      LABS: Recent Results (from the past 2160 hour(s))  UA/M w/rflx Culture, Routine     Status: None   Collection Time: 05/25/18 12:00 AM  Result Value Ref Range   Specific Gravity, UA 1.011 1.005 - 1.030   pH, UA 7.0 5.0 - 7.5   Color, UA Yellow Yellow   Appearance Ur Clear Clear   Leukocytes, UA Negative Negative   Protein, UA Negative Negative/Trace   Glucose, UA Negative Negative   Ketones, UA Negative Negative   RBC, UA Negative Negative   Bilirubin, UA Negative Negative   Urobilinogen, Ur 0.2 0.2 - 1.0 mg/dL   Nitrite, UA Negative Negative   Microscopic Examination Comment     Comment: Microscopic follows if indicated.   Microscopic Examination See below:     Comment: Microscopic was indicated and was performed.   Urinalysis Reflex Comment     Comment: This specimen will not reflex to a Urine Culture.  Microscopic Examination     Status: Abnormal   Collection Time: 05/25/18 12:00 AM  Result Value Ref Range   WBC, UA 0-5 0 - 5 /hpf   RBC, UA None seen 0 - 2 /hpf   Epithelial Cells (non renal) >10 (A) 0 - 10 /hpf   Casts None seen None seen /lpf   Bacteria, UA Few None seen/Few   Comprehensive metabolic panel     Status: Abnormal   Collection Time: 05/31/18  9:45 AM  Result Value Ref Range   Glucose 88 65 - 99 mg/dL   BUN 11 6 - 20 mg/dL   Creatinine, Ser 0.76 0.57 - 1.00 mg/dL   GFR calc non Af Amer 103 >59 mL/min/1.73   GFR calc Af Amer 118 >59 mL/min/1.73   BUN/Creatinine Ratio 14 9 - 23   Sodium 141 134 - 144 mmol/L   Potassium 4.3 3.5 - 5.2 mmol/L   Chloride 101 96 - 106 mmol/L   CO2 25 20 - 29 mmol/L   Calcium 9.3 8.7 - 10.2 mg/dL   Total Protein 6.9 6.0 - 8.5 g/dL   Albumin 4.8 3.8 - 4.8 g/dL   Globulin, Total 2.1 1.5 - 4.5 g/dL   Albumin/Globulin Ratio 2.3 (H) 1.2 - 2.2   Bilirubin Total 0.3 0.0 - 1.2 mg/dL   Alkaline Phosphatase 52 39 - 117 IU/L   AST 16 0 - 40 IU/L   ALT 14 0 - 32 IU/L  CBC     Status: None   Collection Time: 05/31/18  9:45 AM  Result Value Ref Range   WBC 7.2 3.4 - 10.8 x10E3/uL   RBC 4.23 3.77 - 5.28 x10E6/uL   Hemoglobin 12.4 11.1 - 15.9 g/dL   Hematocrit 37.1 34.0 - 46.6 %   MCV 88 79 - 97 fL   MCH 29.3 26.6 - 33.0 pg   MCHC 33.4 31.5 - 35.7 g/dL   RDW 13.1 11.7 - 15.4 %   Platelets 333 150 - 450 x10E3/uL  Lipid Panel w/o Chol/HDL Ratio     Status: Abnormal   Collection Time: 05/31/18  9:45 AM  Result Value Ref Range   Cholesterol, Total 187 100 - 199 mg/dL   Triglycerides 93 0 - 149 mg/dL   HDL 55 >39 mg/dL   VLDL Cholesterol Cal 19 5 - 40 mg/dL   LDL Calculated 113 (H) 0 - 99 mg/dL  T4, free  Status: None   Collection Time: 05/31/18  9:45 AM  Result Value Ref Range   Free T4 0.93 0.82 - 1.77 ng/dL  TSH     Status: None   Collection Time: 05/31/18  9:45 AM  Result Value Ref Range   TSH 1.950 0.450 - 4.500 uIU/mL  VITAMIN D 25 Hydroxy (Vit-D Deficiency, Fractures)     Status: Abnormal   Collection Time: 05/31/18  9:45 AM  Result Value Ref Range   Vit D, 25-Hydroxy 20.1 (L) 30.0 - 100.0 ng/mL    Comment: Vitamin D deficiency has been defined by the [place] and an Endocrine Society practice  guideline as a level of serum 25-OH vitamin D less than 20 ng/mL (1,2). The Endocrine Society went on to further define vitamin D insufficiency as a level between 21 and 29 ng/mL (2). 1. IOM (Institute of Medicine). 2010. Dietary reference    intakes for calcium and D. [place]: The    [employer]. 2. Holick MF, Binkley [place], Bischoff-Ferrari HA, et al.    Evaluation, treatment, and prevention of vitamin D    deficiency: an Endocrine Society clinical practice    guideline. JCEM. 2011 Jul; 96(7):1911-30.   T3     Status: None   Collection Time: 05/31/18  9:45 AM  Result Value Ref Range   T3, Total 126 71 - 180 ng/dL    Assessment/Plan: 1. Encounter for general adult medical examination with abnormal findings Annual health maintenance exam today  2. Migraine with aura and without status migrainosus, not intractable Continue topamax as prescribed. Use toradol as needed and as prescribed to relieve acute migraine headaches.  - ketorolac (TORADOL) 10 MG tablet; Take 1 tablet (10 mg total) by mouth every 8 (eight) hours as needed for moderate pain.  Dispense: 45 tablet; Refill: 3  3. Abnormal weight gain Check labs including thyroid panel for further evaluation. Gave menus for 1200 and 1500 calorie diets along with [employer] information. Consider starting appetite suppressant if all labs look good.   4. Dysuria - UA/M w/rflx Culture, Routine  General Counseling: Arilla verbalizes understanding of the findings of todays visit and agrees with plan of treatment. I have discussed any further diagnostic evaluation that may be needed or ordered today. We also reviewed her medications today. she has been encouraged to call the office with any questions or concerns that should arise related to todays visit.    Counseling:  This patient was seen by Leretha Pol FNP Collaboration with Dr Lavera Guise as a part of collaborative care agreement  Orders Placed This Encounter   Procedures  . Microscopic Examination  . UA/M w/rflx Culture, Routine    Meds ordered this encounter  Medications  . ketorolac (TORADOL) 10 MG tablet    Sig: Take 1 tablet (10 mg total) by mouth every 8 (eight) hours as needed for moderate pain.    Dispense:  45 tablet    Refill:  3    triptans causing nausea    Order Specific Question:   Supervising Provider    Answer:   Lavera Guise [[id number]]    Time spent: [place], MD  Internal Medicine

## 2018-05-26 LAB — UA/M W/RFLX CULTURE, ROUTINE
Bilirubin, UA: NEGATIVE
GLUCOSE, UA: NEGATIVE
KETONES UA: NEGATIVE
Leukocytes, UA: NEGATIVE
NITRITE UA: NEGATIVE
Protein, UA: NEGATIVE
RBC, UA: NEGATIVE
Specific Gravity, UA: 1.011 (ref 1.005–1.030)
UUROB: 0.2 mg/dL (ref 0.2–1.0)
pH, UA: 7 (ref 5.0–7.5)

## 2018-05-26 LAB — MICROSCOPIC EXAMINATION
Casts: NONE SEEN /lpf
RBC, UA: NONE SEEN /hpf (ref 0–2)

## 2018-05-31 ENCOUNTER — Other Ambulatory Visit: Payer: Self-pay | Admitting: Nurse Practitioner

## 2018-06-01 LAB — LIPID PANEL W/O CHOL/HDL RATIO
Cholesterol, Total: 187 mg/dL (ref 100–199)
HDL: 55 mg/dL (ref >39)
LDL Calculated: 113 mg/dL — ABNORMAL HIGH (ref 0–99)
TRIGLYCERIDES: 93 mg/dL (ref 0–149)
VLDL CHOLESTEROL CAL: 19 mg/dL (ref 5–40)

## 2018-06-01 LAB — TSH: TSH: 1.95 u[IU]/mL (ref 0.450–4.500)

## 2018-06-01 LAB — COMPREHENSIVE METABOLIC PANEL
ALK PHOS: 52 IU/L (ref 39–117)
ALT: 14 IU/L (ref 0–32)
AST: 16 IU/L (ref 0–40)
Albumin/Globulin Ratio: 2.3 — ABNORMAL HIGH (ref 1.2–2.2)
Albumin: 4.8 g/dL (ref 3.8–4.8)
BILIRUBIN TOTAL: 0.3 mg/dL (ref 0.0–1.2)
BUN / CREAT RATIO: 14 (ref 9–23)
BUN: 11 mg/dL (ref 6–20)
CHLORIDE: 101 mmol/L (ref 96–106)
CO2: 25 mmol/L (ref 20–29)
CREATININE: 0.76 mg/dL (ref 0.57–1.00)
Calcium: 9.3 mg/dL (ref 8.7–10.2)
GFR calc Af Amer: 118 mL/min/{1.73_m2} (ref >59)
GFR calc non Af Amer: 103 mL/min/{1.73_m2} (ref >59)
GLUCOSE: 88 mg/dL (ref 65–99)
Globulin, Total: 2.1 g/dL (ref 1.5–4.5)
Potassium: 4.3 mmol/L (ref 3.5–5.2)
Sodium: 141 mmol/L (ref 134–144)
Total Protein: 6.9 g/dL (ref 6.0–8.5)

## 2018-06-01 LAB — CBC
HEMATOCRIT: 37.1 % (ref 34.0–46.6)
HEMOGLOBIN: 12.4 g/dL (ref 11.1–15.9)
MCH: 29.3 pg (ref 26.6–33.0)
MCHC: 33.4 g/dL (ref 31.5–35.7)
MCV: 88 fL (ref 79–97)
Platelets: 333 10*3/uL (ref 150–450)
RBC: 4.23 x10E6/uL (ref 3.77–5.28)
RDW: 13.1 % (ref 11.7–15.4)
WBC: 7.2 10*3/uL (ref 3.4–10.8)

## 2018-06-01 LAB — T3: T3, Total: 126 ng/dL (ref 71–180)

## 2018-06-01 LAB — T4, FREE: FREE T4: 0.93 ng/dL (ref 0.82–1.77)

## 2018-06-01 LAB — VITAMIN D 25 HYDROXY (VIT D DEFICIENCY, FRACTURES): Vit D, 25-Hydroxy: 20.1 ng/mL — ABNORMAL LOW (ref 30.0–100.0)

## 2018-06-07 ENCOUNTER — Ambulatory Visit: Payer: BLUE CROSS/BLUE SHIELD | Admitting: Nurse Practitioner

## 2018-06-07 ENCOUNTER — Other Ambulatory Visit: Payer: Self-pay

## 2018-06-07 ENCOUNTER — Encounter: Payer: Self-pay | Admitting: Nurse Practitioner

## 2018-06-07 VITALS — BP 116/74 | HR 77 | Resp 16 | Ht 63.0 in | Wt 194.0 lb

## 2018-06-07 DIAGNOSIS — Z6834 Body mass index (BMI) 34.0-34.9, adult: Secondary | ICD-10-CM | POA: Diagnosis not present

## 2018-06-07 DIAGNOSIS — E559 Vitamin D deficiency, unspecified: Secondary | ICD-10-CM | POA: Diagnosis not present

## 2018-06-07 MED ORDER — PHENTERMINE HCL 37.5 MG PO TABS: 37.5000 mg | ORAL_TABLET | Freq: Every day | ORAL | 1 refills | Status: DC

## 2018-06-07 MED ORDER — ERGOCALCIFEROL 1.25 MG (50000 UT) PO CAPS: 50000.0000 [IU] | ORAL_CAPSULE | ORAL | 5 refills | Status: DC

## 2018-06-07 NOTE — Progress Notes (Signed)
[facility] [place], [place] [address]  Internal MEDICINE  Office Visit Note  Patient Name: Cindy Walker  [id number]  [id number]  Date of Service: 06/21/2018  Chief Complaint  Patient presents with  . Medical Management of Chronic Issues    2wk follow up weight management, metabolic testing    She is concerned about weight gain. She has gained about 5 pounds since she was most recently seen. She states that she is carefully monitoring her diet and exercises three to four times per week. She is finding it increasingly difficult to lose weight. She had routine, fasting labs drawn and all results were norma.       Current Medication: Outpatient Encounter Medications as of 06/07/2018  Medication Sig  . albuterol (PROVENTIL HFA;VENTOLIN HFA) 108 (90 Base) MCG/ACT inhaler Inhale 2 puffs into the lungs every 6 (six) hours as needed for wheezing.  Marland Kitchen ketorolac (TORADOL) 10 MG tablet Take 1 tablet (10 mg total) by mouth every 8 (eight) hours as needed for moderate pain.  . Multiple Vitamins-Minerals (MULTIVITAMIN ADULT PO) Take 1 tablet by mouth daily.  Marland Kitchen nystatin (MYCOSTATIN) 100000 UNIT/ML suspension Take 5 mLs (500,000 Units total) by mouth 4 (four) times daily.  Marland Kitchen triamcinolone (KENALOG) 0.025 % cream Apply 1 application topically 2 (two) times daily.  . ergocalciferol (DRISDOL) 1.25 MG (50000 UT) capsule Take 1 capsule (50,000 Units total) by mouth once a week.  . phentermine (ADIPEX-P) 37.5 MG tablet Take 1 tablet (37.5 mg total) by mouth daily before breakfast.   No facility-administered encounter medications on file as of 06/07/2018.     Surgical History: Past Surgical History:  Procedure Laterality Date  . BREAST CYST EXCISION Left 2014  . CESAREAN SECTION  01/11/11; 09/27/2014  . CESAREAN SECTION N/A 04/16/2012   Procedure: Cesarean Section;  Surgeon: Guss Bunde, MD;  Location: [facility];  Service: Obstetrics;  Laterality: N/A;  Wound Class  (Clean contaminated)  . INSERTION OF MESH N/A 12/01/2014   Procedure: INSERTION OF MESH;  Surgeon: Coralie Keens, MD;  Location: [facility];  Service: General;  Laterality: N/A;  . UMBILICAL HERNIA REPAIR N/A 12/01/2014   Procedure: UMBILICAL HERNIA REPAIR ;  Surgeon: Coralie Keens, MD;  Location: [facility];  Service: General;  Laterality: N/A;  . WISDOM TOOTH EXTRACTION      Medical History: Past Medical History:  Diagnosis Date  . Asthma    prn inhaler  . Migraine   . Umbilical hernia 06/8544    Family History: Family History  Problem Relation Age of Onset  . Diabetes Maternal Aunt        Type 2  . Heart disease Maternal Grandmother   . Cancer Maternal Grandfather        liver cancer  . Heart disease Maternal Grandfather   . Heart disease Paternal Grandmother   . Heart disease Paternal Grandfather   . Cleft palate Son        deceased  . Breast cancer Neg Hx     Social History   Socioeconomic History  . Marital status: Married    Spouse name: Not on file  . Number of children: Not on file  . Years of education: Not on file  . Highest education level: Not on file  Occupational History  . Not on file  Social Needs  . Financial resource strain: Not on file  . Food insecurity:    Worry: Not on file  Inability: Not on file  . Transportation needs:    Medical: Not on file    Non-medical: Not on file  Tobacco Use  . Smoking status: Former Smoker    Packs/day: 0.00    Last attempt to quit: 12/19/2008    Years since quitting: 9.5  . Smokeless tobacco: Never Used  Substance and Sexual Activity  . Alcohol use: Yes    Alcohol/week: 3.0 standard drinks    Types: 3 Glasses of wine per week    Comment: occasionally  . Drug use: No  . Sexual activity: Yes    Partners: Male    Birth control/protection: OCP, Pill  Lifestyle  . Physical activity:    Days per week: Not on file    Minutes per session: Not on file  . Stress: Not on  file  Relationships  . Social connections:    Talks on phone: Not on file    Gets together: Not on file    Attends religious service: Not on file    Active member of club or organization: Not on file    Attends meetings of clubs or organizations: Not on file    Relationship status: Not on file  . Intimate partner violence:    Fear of current or ex partner: Not on file    Emotionally abused: Not on file    Physically abused: Not on file    Forced sexual activity: Not on file  Other Topics Concern  . Not on file  Social History Narrative  . Not on file      Review of Systems  Constitutional: Positive for fatigue. Negative for chills and unexpected weight change.  HENT: Negative for congestion, postnasal drip, rhinorrhea, sneezing and sore throat.   Respiratory: Negative for cough, chest tightness, shortness of breath and wheezing.   Cardiovascular: Negative for chest pain and palpitations.  Gastrointestinal: Negative for abdominal pain, constipation, diarrhea, nausea and vomiting.  Endocrine: Negative for cold intolerance, heat intolerance, polydipsia and polyuria.  Musculoskeletal: Negative for arthralgias, back pain, joint swelling and neck pain.  Skin: Negative for rash.  Neurological: Positive for headaches. Negative for tremors and numbness.  Hematological: Negative for adenopathy. Does not bruise/bleed easily.  Psychiatric/Behavioral: Negative for behavioral problems (Depression), sleep disturbance and suicidal ideas. The patient is not nervous/anxious.     Today's Vitals   06/07/18 1600  BP: 116/74  Pulse: 77  Resp: 16  SpO2: 100%  Weight: 194 lb (88 kg)  Height: 5\' 3"  (1.6 m)   Body mass index is 34.37 kg/m.   Physical Exam Vitals signs and nursing note reviewed.  Constitutional:      General: She is not in acute distress.    Appearance: Normal appearance. She is well-developed. She is not diaphoretic.  HENT:     Head: Normocephalic and atraumatic.      Mouth/Throat:     Pharynx: No oropharyngeal exudate.  Eyes:     Pupils: Pupils are equal, round, and reactive to light.  Neck:     Musculoskeletal: Normal range of motion and neck supple.     Thyroid: No thyromegaly.     Vascular: No JVD.     Trachea: No tracheal deviation.  Cardiovascular:     Rate and Rhythm: Normal rate and regular rhythm.     Heart sounds: Normal heart sounds. No murmur. No friction rub. No gallop.   Pulmonary:     Effort: Pulmonary effort is normal. No respiratory distress.     Breath sounds:  Normal breath sounds. No wheezing or rales.  Chest:     Chest wall: No tenderness.  Abdominal:     General: Bowel sounds are normal.     Palpations: Abdomen is soft.  Musculoskeletal: Normal range of motion.  Lymphadenopathy:     Cervical: No cervical adenopathy.  Skin:    General: Skin is warm and dry.  Neurological:     Mental Status: She is alert and oriented to person, place, and time.     Cranial Nerves: No cranial nerve deficit.  Psychiatric:        Behavior: Behavior normal.        Thought Content: Thought content normal.        Judgment: Judgment normal.    Assessment/Plan: 1. Vitamin D deficiency Start drisdol once weekly.  - ergocalciferol (DRISDOL) 1.25 MG (50000 UT) capsule; Take 1 capsule (50,000 Units total) by mouth once a week.  Dispense: 4 capsule; Refill: 5  2. Body mass index (BMI) of 34.0 to 34.9 in adult Start phentermine 37.5mg  daily. Recommend she consume a 1200 calorie diet. Incorporate exercise into daily routine.  - phentermine (ADIPEX-P) 37.5 MG tablet; Take 1 tablet (37.5 mg total) by mouth daily before breakfast.  Dispense: 30 tablet; Refill: 1  3. Morbid obesity ([place]) A metabolic test was administered today. She was found to have slower than normal metabolism. A 1200 and 1500 calorie diet was recommended and example menus were provided. Advised her to incorporate exercise into daily routine. She signed a waiver for weight management  with stimulant appetite suppressants.   - Metabolic Test  General Counseling: Cindy Walker understanding of the findings of todays visit and agrees with plan of treatment. I have discussed any further diagnostic evaluation that may be needed or ordered today. We also reviewed her medications today. she has been encouraged to call the office with any questions or concerns that should arise related to todays visit.   There is a liability release in patients' chart. There has been a 10 minute discussion about the side effects including but not limited to elevated blood pressure, anxiety, lack of sleep and dry mouth. Pt understands and will like to start/continue on appetite suppressant at this time. There will be one month RX given at the time of visit with proper follow up. [place] diet plan with restricted calories is given to the pt. Pt understands and agrees with  plan of treatment  This patient was seen by Leretha Pol FNP Collaboration with Dr Lavera Guise as a part of collaborative care agreement  Orders Placed This Encounter  Procedures  . Metabolic Test    Meds ordered this encounter  Medications  . phentermine (ADIPEX-P) 37.5 MG tablet    Sig: Take 1 tablet (37.5 mg total) by mouth daily before breakfast.    Dispense:  30 tablet    Refill:  1    Order Specific Question:   Supervising Provider    Answer:   Lavera Guise [place]  . ergocalciferol (DRISDOL) 1.25 MG (50000 UT) capsule    Sig: Take 1 capsule (50,000 Units total) by mouth once a week.    Dispense:  4 capsule    Refill:  5    Order Specific Question:   Supervising Provider    Answer:   Lavera Guise [[id number]]    Time spent: 74 Minutes      Dr Lavera Guise Internal medicine

## 2018-06-09 DIAGNOSIS — R635 Abnormal weight gain: Secondary | ICD-10-CM | POA: Insufficient documentation

## 2018-06-09 DIAGNOSIS — R300 Dysuria: Secondary | ICD-10-CM | POA: Insufficient documentation

## 2018-06-21 DIAGNOSIS — Z6834 Body mass index (BMI) 34.0-34.9, adult: Secondary | ICD-10-CM

## 2018-06-21 DIAGNOSIS — Z6830 Body mass index (BMI) 30.0-30.9, adult: Secondary | ICD-10-CM | POA: Insufficient documentation

## 2018-06-21 DIAGNOSIS — Z6833 Body mass index (BMI) 33.0-33.9, adult: Secondary | ICD-10-CM | POA: Insufficient documentation

## 2018-06-21 DIAGNOSIS — E559 Vitamin D deficiency, unspecified: Secondary | ICD-10-CM | POA: Insufficient documentation

## 2018-07-05 ENCOUNTER — Ambulatory Visit: Payer: BLUE CROSS/BLUE SHIELD | Admitting: Nurse Practitioner

## 2018-07-05 ENCOUNTER — Other Ambulatory Visit: Payer: Self-pay

## 2018-07-05 ENCOUNTER — Encounter: Payer: Self-pay | Admitting: Nurse Practitioner

## 2018-07-05 VITALS — Ht 63.0 in | Wt 178.0 lb

## 2018-07-05 DIAGNOSIS — Z6831 Body mass index (BMI) 31.0-31.9, adult: Secondary | ICD-10-CM

## 2018-07-05 DIAGNOSIS — R5383 Other fatigue: Secondary | ICD-10-CM | POA: Diagnosis not present

## 2018-07-05 NOTE — Progress Notes (Signed)
[facility] [place], [place] [address]  Internal MEDICINE  Telephone Visit  Patient Name: Cindy Walker  [id number]  [id number]  Date of Service: 08/01/2018  I connected with the patient at 4:13pm by webcam and verified the patients identity using two identifiers.   I discussed the limitations, risks, security and privacy concerns of performing an evaluation and management service by webcam and the availability of in person appointments. I also discussed with the patient that there may be a patient responsible charge related to the service.  The patient expressed understanding and agrees to proceed.    Chief Complaint  Patient presents with  . Telephone Screen  . Telephone Assessment  . Medical Management of Chronic Issues    4wk follow up weight management    The patient has been contacted via webcam for follow up visit due to concerns for spread of novel coronavirus. She was started on phentermine at her most recent visit to help her lose weight. She has limited calorie intake to 1200 calories per day and is exercising most days. She has lost 14 pounds since she was last seen. She has no negative complaints or side effects to report from taking phentermine.       Current Medication: Outpatient Encounter Medications as of 07/05/2018  Medication Sig  . albuterol (PROVENTIL HFA;VENTOLIN HFA) 108 (90 Base) MCG/ACT inhaler Inhale 2 puffs into the lungs every 6 (six) hours as needed for wheezing.  . ergocalciferol (DRISDOL) 1.25 MG (50000 UT) capsule Take 1 capsule (50,000 Units total) by mouth once a week.  Marland Kitchen ketorolac (TORADOL) 10 MG tablet Take 1 tablet (10 mg total) by mouth every 8 (eight) hours as needed for moderate pain.  . Multiple Vitamins-Minerals (MULTIVITAMIN ADULT PO) Take 1 tablet by mouth daily.  Marland Kitchen nystatin (MYCOSTATIN) 100000 UNIT/ML suspension Take 5 mLs (500,000 Units total) by mouth 4 (four) times daily.  . phentermine (ADIPEX-P) 37.5  MG tablet Take 1 tablet (37.5 mg total) by mouth daily before breakfast.  . triamcinolone (KENALOG) 0.025 % cream Apply 1 application topically 2 (two) times daily.   No facility-administered encounter medications on file as of 07/05/2018.     Surgical History: Past Surgical History:  Procedure Laterality Date  . BREAST CYST EXCISION Left 2014  . CESAREAN SECTION  01/11/11; 09/27/2014  . CESAREAN SECTION N/A 04/16/2012   Procedure: Cesarean Section;  Surgeon: Guss Bunde, MD;  Location: [facility];  Service: Obstetrics;  Laterality: N/A;  Wound Class (Clean contaminated)  . INSERTION OF MESH N/A 12/01/2014   Procedure: INSERTION OF MESH;  Surgeon: Coralie Keens, MD;  Location: [facility];  Service: General;  Laterality: N/A;  . UMBILICAL HERNIA REPAIR N/A 12/01/2014   Procedure: UMBILICAL HERNIA REPAIR ;  Surgeon: Coralie Keens, MD;  Location: [facility];  Service: General;  Laterality: N/A;  . WISDOM TOOTH EXTRACTION      Medical History: Past Medical History:  Diagnosis Date  . Asthma    prn inhaler  . Migraine   . Umbilical hernia 03/2749    Family History: Family History  Problem Relation Age of Onset  . Diabetes Maternal Aunt        Type 2  . Heart disease Maternal Grandmother   . Cancer Maternal Grandfather        liver cancer  . Heart disease Maternal Grandfather   . Heart disease Paternal Grandmother   . Heart disease Paternal Grandfather   . Cleft  palate Son        deceased  . Breast cancer Neg Hx     Social History   Socioeconomic History  . Marital status: Married    Spouse name: Not on file  . Number of children: Not on file  . Years of education: Not on file  . Highest education level: Not on file  Occupational History  . Not on file  Social Needs  . Financial resource strain: Not on file  . Food insecurity:    Worry: Not on file    Inability: Not on file  . Transportation needs:    Medical: Not on file     Non-medical: Not on file  Tobacco Use  . Smoking status: Former Smoker    Packs/day: 0.00    Last attempt to quit: 12/19/2008    Years since quitting: 9.6  . Smokeless tobacco: Never Used  Substance and Sexual Activity  . Alcohol use: Yes    Alcohol/week: 3.0 standard drinks    Types: 3 Glasses of wine per week    Comment: occasionally  . Drug use: No  . Sexual activity: Yes    Partners: Male    Birth control/protection: OCP, Pill  Lifestyle  . Physical activity:    Days per week: Not on file    Minutes per session: Not on file  . Stress: Not on file  Relationships  . Social connections:    Talks on phone: Not on file    Gets together: Not on file    Attends religious service: Not on file    Active member of club or organization: Not on file    Attends meetings of clubs or organizations: Not on file    Relationship status: Not on file  . Intimate partner violence:    Fear of current or ex partner: Not on file    Emotionally abused: Not on file    Physically abused: Not on file    Forced sexual activity: Not on file  Other Topics Concern  . Not on file  Social History Narrative  . Not on file      Review of Systems  Constitutional: Negative for chills, fatigue and unexpected weight change.       Weight loss 14 pounds since her last visit .  HENT: Negative for congestion, postnasal drip, rhinorrhea, sneezing and sore throat.   Respiratory: Negative for cough, chest tightness, shortness of breath and wheezing.   Cardiovascular: Negative for chest pain and palpitations.  Gastrointestinal: Negative for abdominal pain, constipation, diarrhea, nausea and vomiting.  Musculoskeletal: Negative for arthralgias, back pain, joint swelling and neck pain.  Skin: Negative for rash.  Neurological: Negative.  Negative for tremors and numbness.  Hematological: Negative for adenopathy. Does not bruise/bleed easily.  Psychiatric/Behavioral: Negative for behavioral problems  (Depression), sleep disturbance and suicidal ideas. The patient is not nervous/anxious.     Today's Vitals   07/05/18 1544  Weight: 178 lb (80.7 kg)  Height: 5\' 3"  (1.6 m)   Body mass index is 31.53 kg/m.  Observation/Objective:   The patient is alert and oriented. She is pleasant and answers all questions appropriately. Breathing is non-labored. She is in no acute distress at this time.    Assessment/Plan: 1. Other fatigue Improving as she exercises more and loses weight. Will continue to monitor   2. BMI 31.0-31.9,adult Improving. May continue phentermine 37.5mg  tablets every day. Limit calorie intake to 1200 calories per day and contineu with daily exercise.  General Counseling: emalee knies understanding of the findings of today's phone visit and agrees with plan of treatment. I have discussed any further diagnostic evaluation that may be needed or ordered today. We also reviewed her medications today. she has been encouraged to call the office with any questions or concerns that should arise related to todays visit.    There is a liability release in patients' chart. There has been a 10 minute discussion about the side effects including but not limited to elevated blood pressure, anxiety, lack of sleep and dry mouth. Pt understands and will like to start/continue on appetite suppressant at this time. There will be one month RX given at the time of visit with proper follow up. Nova diet plan with restricted calories is given to the pt. Pt understands and agrees with  plan of treatment  This patient was seen by Leretha Pol FNP Collaboration with Dr Lavera Guise as a part of collaborative care agreement  Time spent: 33 Minutes    Dr Lavera Guise Internal medicine

## 2018-07-11 ENCOUNTER — Encounter: Payer: Self-pay | Admitting: Nurse Practitioner

## 2018-08-01 DIAGNOSIS — R5383 Other fatigue: Secondary | ICD-10-CM | POA: Insufficient documentation

## 2018-08-03 ENCOUNTER — Ambulatory Visit: Payer: BLUE CROSS/BLUE SHIELD | Admitting: Adult Health

## 2018-08-03 ENCOUNTER — Other Ambulatory Visit: Payer: Self-pay

## 2018-08-03 ENCOUNTER — Encounter: Payer: Self-pay | Admitting: Nurse Practitioner

## 2018-08-03 VITALS — Resp 16 | Ht 63.0 in | Wt 170.8 lb

## 2018-08-03 DIAGNOSIS — Z6834 Body mass index (BMI) 34.0-34.9, adult: Secondary | ICD-10-CM

## 2018-08-03 DIAGNOSIS — R5383 Other fatigue: Secondary | ICD-10-CM | POA: Diagnosis not present

## 2018-08-03 DIAGNOSIS — E559 Vitamin D deficiency, unspecified: Secondary | ICD-10-CM

## 2018-08-03 MED ORDER — PHENTERMINE HCL 37.5 MG PO TABS: 37.5000 mg | ORAL_TABLET | Freq: Every day | ORAL | 1 refills | Status: DC

## 2018-08-03 NOTE — Patient Instructions (Signed)

## 2018-08-03 NOTE — Progress Notes (Signed)
[facility] [place],  [address]  Internal MEDICINE  Telephone Visit  Patient Name: Cindy Walker  [id number]  [id number]  Date of Service: 08/03/2018  I connected with the patient at 1558 by Video and verified the patients identity using two identifiers.   I discussed the limitations, risks, security and privacy concerns of performing an evaluation and management service by telephone and the availability of in person appointments. I also discussed with the patient that there may be a patient responsible charge related to the service.  The patient expressed understanding and agrees to proceed.    Chief Complaint  Patient presents with  . Telephone Screen  . Medical Management of Chronic Issues    4 week follow up weight management   . Telephone Assessment    HPI  PT seen on video for follow up on weight loss. Patient is using phentermine for weight loss.  Since our last visit they have lost  8 pounds.  The patient denies any chest pain, palpitations, shortness of breath, constipation, headaches or any other side effects of the medication.  Patient wishes to continue to use this medication for weight loss at this time.   Current Medication: Outpatient Encounter Medications as of 08/03/2018  Medication Sig  . albuterol (PROVENTIL HFA;VENTOLIN HFA) 108 (90 Base) MCG/ACT inhaler Inhale 2 puffs into the lungs every 6 (six) hours as needed for wheezing.  . ergocalciferol (DRISDOL) 1.25 MG (50000 UT) capsule Take 1 capsule (50,000 Units total) by mouth once a week.  Marland Kitchen ketorolac (TORADOL) 10 MG tablet Take 1 tablet (10 mg total) by mouth every 8 (eight) hours as needed for moderate pain.  . Multiple Vitamins-Minerals (MULTIVITAMIN ADULT PO) Take 1 tablet by mouth daily.  Marland Kitchen nystatin (MYCOSTATIN) 100000 UNIT/ML suspension Take 5 mLs (500,000 Units total) by mouth 4 (four) times daily.  . phentermine (ADIPEX-P) 37.5 MG tablet Take 1 tablet (37.5 mg total)  by mouth daily before breakfast.  . triamcinolone (KENALOG) 0.025 % cream Apply 1 application topically 2 (two) times daily.  . [DISCONTINUED] phentermine (ADIPEX-P) 37.5 MG tablet Take 1 tablet (37.5 mg total) by mouth daily before breakfast.   No facility-administered encounter medications on file as of 08/03/2018.     Surgical History: Past Surgical History:  Procedure Laterality Date  . BREAST CYST EXCISION Left 2014  . CESAREAN SECTION  01/11/11; 09/27/2014  . CESAREAN SECTION N/A 04/16/2012   Procedure: Cesarean Section;  Surgeon: Guss Bunde, MD;  Location: [facility];  Service: Obstetrics;  Laterality: N/A;  Wound Class (Clean contaminated)  . INSERTION OF MESH N/A 12/01/2014   Procedure: INSERTION OF MESH;  Surgeon: Coralie Keens, MD;  Location: [facility];  Service: General;  Laterality: N/A;  . UMBILICAL HERNIA REPAIR N/A 12/01/2014   Procedure: UMBILICAL HERNIA REPAIR ;  Surgeon: Coralie Keens, MD;  Location: [facility];  Service: General;  Laterality: N/A;  . WISDOM TOOTH EXTRACTION      Medical History: Past Medical History:  Diagnosis Date  . Asthma    prn inhaler  . Migraine   . Umbilical hernia 05/6142    Family History: Family History  Problem Relation Age of Onset  . Diabetes Maternal Aunt        Type 2  . Heart disease Maternal Grandmother   . Cancer Maternal Grandfather        liver cancer  . Heart disease Maternal Grandfather   . Heart disease Paternal Grandmother   .  Heart disease Paternal Grandfather   . Cleft palate Son        deceased  . Breast cancer Neg Hx     Social History   Socioeconomic History  . Marital status: Married    Spouse name: Not on file  . Number of children: Not on file  . Years of education: Not on file  . Highest education level: Not on file  Occupational History  . Not on file  Social Needs  . Financial resource strain: Not on file  . Food insecurity:    Worry: Not on file     Inability: Not on file  . Transportation needs:    Medical: Not on file    Non-medical: Not on file  Tobacco Use  . Smoking status: Former Smoker    Packs/day: 0.00    Last attempt to quit: 12/19/2008    Years since quitting: 9.6  . Smokeless tobacco: Never Used  Substance and Sexual Activity  . Alcohol use: Yes    Alcohol/week: 3.0 standard drinks    Types: 3 Glasses of wine per week    Comment: occasionally  . Drug use: No  . Sexual activity: Yes    Partners: Male    Birth control/protection: OCP, Pill  Lifestyle  . Physical activity:    Days per week: Not on file    Minutes per session: Not on file  . Stress: Not on file  Relationships  . Social connections:    Talks on phone: Not on file    Gets together: Not on file    Attends religious service: Not on file    Active member of club or organization: Not on file    Attends meetings of clubs or organizations: Not on file    Relationship status: Not on file  . Intimate partner violence:    Fear of current or ex partner: Not on file    Emotionally abused: Not on file    Physically abused: Not on file    Forced sexual activity: Not on file  Other Topics Concern  . Not on file  Social History Narrative  . Not on file      Review of Systems  Constitutional: Negative for chills, fatigue and unexpected weight change.  HENT: Negative for congestion, rhinorrhea, sneezing and sore throat.   Eyes: Negative for photophobia, pain and redness.  Respiratory: Negative for cough, chest tightness and shortness of breath.   Cardiovascular: Negative for chest pain and palpitations.  Gastrointestinal: Negative for abdominal pain, constipation, diarrhea, nausea and vomiting.  Endocrine: Negative.   Genitourinary: Negative for dysuria and frequency.  Musculoskeletal: Negative for arthralgias, back pain, joint swelling and neck pain.  Skin: Negative for rash.  Allergic/Immunologic: Negative.   Neurological: Negative for tremors and  numbness.  Hematological: Negative for adenopathy. Does not bruise/bleed easily.  Psychiatric/Behavioral: Negative for behavioral problems and sleep disturbance. The patient is not nervous/anxious.     Vital Signs: Resp 16   Ht 5\' 3"  (1.6 m)   Wt 170 lb 12.8 oz (77.5 kg)   BMI 30.26 kg/m    Observation/Objective:  Well appearing, conversing with ease.  NAD noted.    Assessment/Plan: 1. Other fatigue Improved, pt denies further issues.  2. Body mass index (BMI) of 34.0 to 34.9 in adult Obesity Counseling: Risk Assessment: An assessment of behavioral risk factors was made today and includes lack of exercise sedentary lifestyle, lack of portion control and poor dietary habits.  Risk Modification Advice: She  was counseled on portion control guidelines. Restricting daily caloric intake to. . The detrimental long term effects of obesity on her health and ongoing poor compliance was also discussed with the patient.   There is a liability release in patients' chart. There has been a 10 minute discussion about the side effects including but not limited to elevated blood pressure, anxiety, lack of sleep and dry mouth. Pt understands and will like to start/continue on appetite suppressant at this time. There will be one month RX given at the time of visit with proper follow up. Nova diet plan with restricted calories is given to the pt. Pt understands and agrees with  plan of treatment - phentermine (ADIPEX-P) 37.5 MG tablet; Take 1 tablet (37.5 mg total) by mouth daily before breakfast.  Dispense: 30 tablet; Refill: 1  3. Vitamin D deficiency Continue to take Vitamin D as discussed.   General Counseling: beula joyner understanding of the findings of today's phone visit and agrees with plan of treatment. I have discussed any further diagnostic evaluation that may be needed or ordered today. We also reviewed her medications today. she has been encouraged to call the office with any  questions or concerns that should arise related to todays visit.    No orders of the defined types were placed in this encounter.   Meds ordered this encounter  Medications  . phentermine (ADIPEX-P) 37.5 MG tablet    Sig: Take 1 tablet (37.5 mg total) by mouth daily before breakfast.    Dispense:  30 tablet    Refill:  1    Time spent: 12 Minutes    Orson Gear AGNP-C Internal medicine

## 2018-09-04 ENCOUNTER — Other Ambulatory Visit: Payer: Self-pay

## 2018-09-04 ENCOUNTER — Ambulatory Visit: Payer: BLUE CROSS/BLUE SHIELD | Admitting: Nurse Practitioner

## 2018-09-04 ENCOUNTER — Encounter: Payer: Self-pay | Admitting: Nurse Practitioner

## 2018-09-04 VITALS — BP 137/75 | HR 82 | Resp 16 | Ht 63.0 in | Wt 172.0 lb

## 2018-09-04 DIAGNOSIS — R5383 Other fatigue: Secondary | ICD-10-CM

## 2018-09-04 DIAGNOSIS — Z6830 Body mass index (BMI) 30.0-30.9, adult: Secondary | ICD-10-CM

## 2018-09-04 MED ORDER — PHENTERMINE HCL 37.5 MG PO TABS: 37.5000 mg | ORAL_TABLET | Freq: Every day | ORAL | 1 refills | Status: DC

## 2018-09-04 NOTE — Progress Notes (Signed)
[facility] [place] [place], [place] [address]  Internal MEDICINE  Office Visit Note  Patient Name: Cindy Walker  [id number]  [id number]  Date of Service: 09/04/2018  Chief Complaint  Patient presents with  . Medical Management of Chronic Issues    4 week follow up weight loss management     The patient is here for follow up of weight management. She is taking phentermine for this. In the office today, she weighs 172, however, this morning, weighing at home 164. Last visit, she weighed at home and was 170. She reports no negative side effects other than dry mouth.       Current Medication: Outpatient Encounter Medications as of 09/04/2018  Medication Sig  . albuterol (PROVENTIL HFA;VENTOLIN HFA) 108 (90 Base) MCG/ACT inhaler Inhale 2 puffs into the lungs every 6 (six) hours as needed for wheezing.  . ergocalciferol (DRISDOL) 1.25 MG (50000 UT) capsule Take 1 capsule (50,000 Units total) by mouth once a week.  Marland Kitchen ketorolac (TORADOL) 10 MG tablet Take 1 tablet (10 mg total) by mouth every 8 (eight) hours as needed for moderate pain.  . Multiple Vitamins-Minerals (MULTIVITAMIN ADULT PO) Take 1 tablet by mouth daily.  Marland Kitchen nystatin (MYCOSTATIN) 100000 UNIT/ML suspension Take 5 mLs (500,000 Units total) by mouth 4 (four) times daily.  . phentermine (ADIPEX-P) 37.5 MG tablet Take 1 tablet (37.5 mg total) by mouth daily before breakfast.  . triamcinolone (KENALOG) 0.025 % cream Apply 1 application topically 2 (two) times daily.  . [DISCONTINUED] phentermine (ADIPEX-P) 37.5 MG tablet Take 1 tablet (37.5 mg total) by mouth daily before breakfast.   No facility-administered encounter medications on file as of 09/04/2018.     Surgical History: Past Surgical History:  Procedure Laterality Date  . BREAST CYST EXCISION Left 2014  . CESAREAN SECTION  01/11/11; 09/27/2014  . CESAREAN SECTION N/A 04/16/2012   Procedure: Cesarean Section;  Surgeon: Guss Bunde, MD;   Location: [facility];  Service: Obstetrics;  Laterality: N/A;  Wound Class (Clean contaminated)  . INSERTION OF MESH N/A 12/01/2014   Procedure: INSERTION OF MESH;  Surgeon: Coralie Keens, MD;  Location: [facility];  Service: General;  Laterality: N/A;  . UMBILICAL HERNIA REPAIR N/A 12/01/2014   Procedure: UMBILICAL HERNIA REPAIR ;  Surgeon: Coralie Keens, MD;  Location: [facility];  Service: General;  Laterality: N/A;  . WISDOM TOOTH EXTRACTION      Medical History: Past Medical History:  Diagnosis Date  . Asthma    prn inhaler  . Migraine   . Umbilical hernia 0/0867    Family History: Family History  Problem Relation Age of Onset  . Diabetes Maternal Aunt        Type 2  . Heart disease Maternal Grandmother   . Cancer Maternal Grandfather        liver cancer  . Heart disease Maternal Grandfather   . Heart disease Paternal Grandmother   . Heart disease Paternal Grandfather   . Cleft palate Son        deceased  . Breast cancer Neg Hx     Social History   Socioeconomic History  . Marital status: Married    Spouse name: Not on file  . Number of children: Not on file  . Years of education: Not on file  . Highest education level: Not on file  Occupational History  . Not on file  Social Needs  . Financial resource strain: Not on file  .  Food insecurity    Worry: Not on file    Inability: Not on file  . Transportation needs    Medical: Not on file    Non-medical: Not on file  Tobacco Use  . Smoking status: Former Smoker    Packs/day: 0.00    Quit date: 12/19/2008    Years since quitting: 9.7  . Smokeless tobacco: Never Used  Substance and Sexual Activity  . Alcohol use: Yes    Alcohol/week: 3.0 standard drinks    Types: 3 Glasses of wine per week    Comment: occasionally  . Drug use: No  . Sexual activity: Yes    Partners: Male    Birth control/protection: OCP, Pill  Lifestyle  . Physical activity    Days per week: Not on file     Minutes per session: Not on file  . Stress: Not on file  Relationships  . Social Herbalist on phone: Not on file    Gets together: Not on file    Attends religious service: Not on file    Active member of club or organization: Not on file    Attends meetings of clubs or organizations: Not on file    Relationship status: Not on file  . Intimate partner violence    Fear of current or ex partner: Not on file    Emotionally abused: Not on file    Physically abused: Not on file    Forced sexual activity: Not on file  Other Topics Concern  . Not on file  Social History Narrative  . Not on file      Review of Systems  Constitutional: Negative for chills, fatigue and unexpected weight change.       Two pound weight gain on our scale, but six pound weight loss according to scale she has been using for last two visits.   HENT: Negative for congestion, postnasal drip, rhinorrhea, sneezing and sore throat.   Respiratory: Negative for cough, chest tightness, shortness of breath and wheezing.   Cardiovascular: Negative for chest pain and palpitations.  Gastrointestinal: Negative for abdominal pain, constipation, diarrhea, nausea and vomiting.  Musculoskeletal: Negative for arthralgias, back pain, joint swelling and neck pain.  Skin: Negative for rash.  Neurological: Negative for tremors.  Hematological: Negative for adenopathy. Does not bruise/bleed easily.  Psychiatric/Behavioral: Negative for behavioral problems (Depression), sleep disturbance and suicidal ideas. The patient is not nervous/anxious.     Today's Vitals   09/04/18 1540  BP: 137/75  Pulse: 82  Resp: 16  SpO2: 96%  Weight: 172 lb (78 kg)  Height: 5\' 3"  (1.6 m)   Body mass index is 30.47 kg/m.  Physical Exam Vitals signs and nursing note reviewed.  Constitutional:      General: She is not in acute distress.    Appearance: Normal appearance. She is well-developed. She is not diaphoretic.  HENT:      Head: Normocephalic and atraumatic.     Mouth/Throat:     Pharynx: No oropharyngeal exudate.  Eyes:     Pupils: Pupils are equal, round, and reactive to light.  Neck:     Musculoskeletal: Normal range of motion and neck supple.     Thyroid: No thyromegaly.     Vascular: No JVD.     Trachea: No tracheal deviation.  Cardiovascular:     Rate and Rhythm: Normal rate and regular rhythm.     Heart sounds: Normal heart sounds. No murmur. No friction rub. No gallop.  Pulmonary:     Effort: Pulmonary effort is normal. No respiratory distress.     Breath sounds: Normal breath sounds. No wheezing or rales.  Chest:     Chest wall: No tenderness.  Abdominal:     General: Bowel sounds are normal.     Palpations: Abdomen is soft.  Musculoskeletal: Normal range of motion.  Lymphadenopathy:     Cervical: No cervical adenopathy.  Skin:    General: Skin is warm and dry.  Neurological:     Mental Status: She is alert and oriented to person, place, and time.     Cranial Nerves: No cranial nerve deficit.  Psychiatric:        Behavior: Behavior normal.        Thought Content: Thought content normal.        Judgment: Judgment normal.   Assessment/Plan: 1. Other fatigue Improving. Continue to monitor   2. Body mass index (bmi) 30.0-30.9, adult Continues to improve. May use phentermine 37.5mg  daily. Limit calorie intake to 1200-150 calories per day and continue to exercise regularly.  - phentermine (ADIPEX-P) 37.5 MG tablet; Take 1 tablet (37.5 mg total) by mouth daily before breakfast.  Dispense: 30 tablet; Refill: 1  General Counseling: Sharnae verbalizes understanding of the findings of todays visit and agrees with plan of treatment. I have discussed any further diagnostic evaluation that may be needed or ordered today. We also reviewed her medications today. she has been encouraged to call the office with any questions or concerns that should arise related to todays visit.   There is a  liability release in patients' chart. There has been a 10 minute discussion about the side effects including but not limited to elevated blood pressure, anxiety, lack of sleep and dry mouth. Pt understands and will like to start/continue on appetite suppressant at this time. There will be one month RX given at the time of visit with proper follow up. Nova diet plan with restricted calories is given to the pt. Pt understands and agrees with  plan of treatment  This patient was seen by Leretha Pol FNP Collaboration with Dr Lavera Guise as a part of collaborative care agreement  Meds ordered this encounter  Medications  . phentermine (ADIPEX-P) 37.5 MG tablet    Sig: Take 1 tablet (37.5 mg total) by mouth daily before breakfast.    Dispense:  30 tablet    Refill:  1    Order Specific Question:   Supervising Provider    Answer:   Lavera Guise [[id number]]    Time spent: 53 Minutes      Dr Lavera Guise Internal medicine

## 2018-10-16 ENCOUNTER — Ambulatory Visit: Payer: BLUE CROSS/BLUE SHIELD | Admitting: Nurse Practitioner

## 2018-11-02 ENCOUNTER — Ambulatory Visit: Payer: BLUE CROSS/BLUE SHIELD | Admitting: Nurse Practitioner

## 2018-11-02 ENCOUNTER — Other Ambulatory Visit: Payer: Self-pay

## 2018-11-02 ENCOUNTER — Encounter: Payer: Self-pay | Admitting: Nurse Practitioner

## 2018-11-02 VITALS — BP 126/80 | HR 80 | Resp 16 | Ht 63.0 in | Wt 164.4 lb

## 2018-11-02 DIAGNOSIS — R5383 Other fatigue: Secondary | ICD-10-CM | POA: Diagnosis not present

## 2018-11-02 DIAGNOSIS — Z6829 Body mass index (BMI) 29.0-29.9, adult: Secondary | ICD-10-CM

## 2018-11-02 MED ORDER — PHENTERMINE HCL 37.5 MG PO TABS: 37.5000 mg | ORAL_TABLET | Freq: Every day | ORAL | 1 refills | Status: DC

## 2018-11-02 NOTE — Progress Notes (Signed)
[facility] [place], [place] [address]  Internal MEDICINE  Office Visit Note  Patient Name: Cindy Walker  [id number]  [id number]  Date of Service: 11/02/2018  Chief Complaint  Patient presents with  . Follow-up    weight management    Patient here for follow up of weight management. Taking phentermine to help suppress appetite. Limits calorie intake and exercises frequently. Has lost 6 pounds since her last visit. Has lost 31 pounds since March, when she started the program. She states that she has no negative side effects related to taking phentermine.      Current Medication: Outpatient Encounter Medications as of 11/02/2018  Medication Sig  . albuterol (PROVENTIL HFA;VENTOLIN HFA) 108 (90 Base) MCG/ACT inhaler Inhale 2 puffs into the lungs every 6 (six) hours as needed for wheezing.  . ergocalciferol (DRISDOL) 1.25 MG (50000 UT) capsule Take 1 capsule (50,000 Units total) by mouth once a week.  Marland Kitchen ketorolac (TORADOL) 10 MG tablet Take 1 tablet (10 mg total) by mouth every 8 (eight) hours as needed for moderate pain.  . Multiple Vitamins-Minerals (MULTIVITAMIN ADULT PO) Take 1 tablet by mouth daily.  . phentermine (ADIPEX-P) 37.5 MG tablet Take 1 tablet (37.5 mg total) by mouth daily before breakfast.  . [DISCONTINUED] phentermine (ADIPEX-P) 37.5 MG tablet Take 1 tablet (37.5 mg total) by mouth daily before breakfast.  . [DISCONTINUED] nystatin (MYCOSTATIN) 100000 UNIT/ML suspension Take 5 mLs (500,000 Units total) by mouth 4 (four) times daily. (Patient not taking: Reported on 11/02/2018)  . [DISCONTINUED] triamcinolone (KENALOG) 0.025 % cream Apply 1 application topically 2 (two) times daily. (Patient not taking: Reported on 11/02/2018)   No facility-administered encounter medications on file as of 11/02/2018.     Surgical History: Past Surgical History:  Procedure Laterality Date  . BREAST CYST EXCISION Left 2014  . CESAREAN SECTION  01/11/11;  09/27/2014  . CESAREAN SECTION N/A 04/16/2012   Procedure: Cesarean Section;  Surgeon: Guss Bunde, MD;  Location: [facility];  Service: Obstetrics;  Laterality: N/A;  Wound Class (Clean contaminated)  . INSERTION OF MESH N/A 12/01/2014   Procedure: INSERTION OF MESH;  Surgeon: Coralie Keens, MD;  Location: [facility];  Service: General;  Laterality: N/A;  . UMBILICAL HERNIA REPAIR N/A 12/01/2014   Procedure: UMBILICAL HERNIA REPAIR ;  Surgeon: Coralie Keens, MD;  Location: [facility];  Service: General;  Laterality: N/A;  . WISDOM TOOTH EXTRACTION      Medical History: Past Medical History:  Diagnosis Date  . Asthma    prn inhaler  . Migraine   . Umbilical hernia 05/2438    Family History: Family History  Problem Relation Age of Onset  . Diabetes Maternal Aunt        Type 2  . Heart disease Maternal Grandmother   . Cancer Maternal Grandfather        liver cancer  . Heart disease Maternal Grandfather   . Heart disease Paternal Grandmother   . Heart disease Paternal Grandfather   . Cleft palate Son        deceased  . Breast cancer Neg Hx     Social History   Socioeconomic History  . Marital status: Married    Spouse name: Not on file  . Number of children: Not on file  . Years of education: Not on file  . Highest education level: Not on file  Occupational History  . Not on file  Social Needs  .  Financial resource strain: Not on file  . Food insecurity    Worry: Not on file    Inability: Not on file  . Transportation needs    Medical: Not on file    Non-medical: Not on file  Tobacco Use  . Smoking status: Former Smoker    Packs/day: 0.00    Quit date: 12/19/2008    Years since quitting: 9.8  . Smokeless tobacco: Never Used  Substance and Sexual Activity  . Alcohol use: Yes    Alcohol/week: 3.0 standard drinks    Types: 3 Glasses of wine per week    Comment: occasionally  . Drug use: No  . Sexual activity: Yes    Partners:  Male    Birth control/protection: OCP, Pill  Lifestyle  . Physical activity    Days per week: Not on file    Minutes per session: Not on file  . Stress: Not on file  Relationships  . Social Herbalist on phone: Not on file    Gets together: Not on file    Attends religious service: Not on file    Active member of club or organization: Not on file    Attends meetings of clubs or organizations: Not on file    Relationship status: Not on file  . Intimate partner violence    Fear of current or ex partner: Not on file    Emotionally abused: Not on file    Physically abused: Not on file    Forced sexual activity: Not on file  Other Topics Concern  . Not on file  Social History Narrative  . Not on file      Review of Systems  Constitutional: Negative for chills, fatigue and unexpected weight change.       Six pound weight loss since her last visit.   HENT: Negative for congestion, postnasal drip, rhinorrhea, sneezing and sore throat.   Respiratory: Negative for cough, chest tightness, shortness of breath and wheezing.   Cardiovascular: Negative for chest pain and palpitations.  Gastrointestinal: Negative for abdominal pain, constipation, diarrhea, nausea and vomiting.  Musculoskeletal: Negative for arthralgias, back pain, joint swelling and neck pain.  Skin: Negative for rash.  Neurological: Negative for tremors.  Hematological: Negative for adenopathy. Does not bruise/bleed easily.  Psychiatric/Behavioral: Negative for behavioral problems (Depression), sleep disturbance and suicidal ideas. The patient is not nervous/anxious.    Today's Vitals   11/02/18 1123  BP: 126/80  Pulse: 80  Resp: 16  SpO2: 100%  Weight: 164 lb 6.4 oz (74.6 kg)  Height: 5\' 3"  (1.6 m)   Body mass index is 29.12 kg/m.  Physical Exam Vitals signs and nursing note reviewed.  Constitutional:      General: She is not in acute distress.    Appearance: Normal appearance. She is  well-developed. She is not diaphoretic.  HENT:     Head: Normocephalic and atraumatic.     Mouth/Throat:     Pharynx: No oropharyngeal exudate.  Eyes:     Pupils: Pupils are equal, round, and reactive to light.  Neck:     Musculoskeletal: Normal range of motion and neck supple.     Thyroid: No thyromegaly.     Vascular: No JVD.     Trachea: No tracheal deviation.  Cardiovascular:     Rate and Rhythm: Normal rate and regular rhythm.     Heart sounds: Normal heart sounds. No murmur. No friction rub. No gallop.   Pulmonary:  Effort: Pulmonary effort is normal. No respiratory distress.     Breath sounds: Normal breath sounds. No wheezing or rales.  Chest:     Chest wall: No tenderness.  Abdominal:     General: Bowel sounds are normal.     Palpations: Abdomen is soft.  Musculoskeletal: Normal range of motion.  Lymphadenopathy:     Cervical: No cervical adenopathy.  Skin:    General: Skin is warm and dry.  Neurological:     Mental Status: She is alert and oriented to person, place, and time.     Cranial Nerves: No cranial nerve deficit.  Psychiatric:        Behavior: Behavior normal.        Thought Content: Thought content normal.        Judgment: Judgment normal.    Assessment/Plan:  1. Other fatigue Improving with weight loss.   2. Body mass index (bmi) 29.0-29.9, adult May continue phentermine daily. Limit calorie intake to 1200 to 1500 calories a day and continue to be physically active.  - phentermine (ADIPEX-P) 37.5 MG tablet; Take 1 tablet (37.5 mg total) by mouth daily before breakfast.  Dispense: 30 tablet; Refill: 1  General Counseling: Ninel verbalizes understanding of the findings of todays visit and agrees with plan of treatment. I have discussed any further diagnostic evaluation that may be needed or ordered today. We also reviewed her medications today. she has been encouraged to call the office with any questions or concerns that should arise related to  todays visit.    There is a liability release in patients' chart. There has been a 10 minute discussion about the side effects including but not limited to elevated blood pressure, anxiety, lack of sleep and dry mouth. Pt understands and will like to start/continue on appetite suppressant at this time. There will be one month RX given at the time of visit with proper follow up. Nova diet plan with restricted calories is given to the pt. Pt understands and agrees with  plan of treatment  This patient was seen by Leretha Pol FNP Collaboration with Dr Lavera Guise as a part of collaborative care agreement  Meds ordered this encounter  Medications  . phentermine (ADIPEX-P) 37.5 MG tablet    Sig: Take 1 tablet (37.5 mg total) by mouth daily before breakfast.    Dispense:  30 tablet    Refill:  1    Order Specific Question:   Supervising Provider    Answer:   Lavera Guise [[id number]]    Time spent: 66 Minutes      Dr Lavera Guise Internal medicine

## 2018-11-19 ENCOUNTER — Other Ambulatory Visit: Payer: Self-pay

## 2018-11-19 DIAGNOSIS — E559 Vitamin D deficiency, unspecified: Secondary | ICD-10-CM

## 2018-11-19 MED ORDER — ERGOCALCIFEROL 1.25 MG (50000 UT) PO CAPS: 50000.0000 [IU] | ORAL_CAPSULE | ORAL | 5 refills | Status: DC

## 2018-12-17 ENCOUNTER — Ambulatory Visit: Payer: BLUE CROSS/BLUE SHIELD | Admitting: Nurse Practitioner

## 2018-12-21 ENCOUNTER — Ambulatory Visit: Payer: BLUE CROSS/BLUE SHIELD | Admitting: Nurse Practitioner

## 2018-12-21 ENCOUNTER — Encounter: Payer: Self-pay | Admitting: Nurse Practitioner

## 2018-12-21 ENCOUNTER — Other Ambulatory Visit: Payer: Self-pay

## 2018-12-21 VITALS — BP 127/93 | HR 87 | Temp 98.6°F | Resp 16 | Ht 63.0 in | Wt 159.0 lb

## 2018-12-21 DIAGNOSIS — Z6828 Body mass index (BMI) 28.0-28.9, adult: Secondary | ICD-10-CM

## 2018-12-21 DIAGNOSIS — R5383 Other fatigue: Secondary | ICD-10-CM | POA: Diagnosis not present

## 2018-12-21 MED ORDER — PHENTERMINE HCL 37.5 MG PO TABS: 37.5000 mg | ORAL_TABLET | Freq: Every day | ORAL | 0 refills | Status: DC

## 2018-12-21 NOTE — Progress Notes (Signed)
[facility] [place], [place] [address]  Internal MEDICINE  Office Visit Note  Patient Name: Cindy Walker  [id number]  [id number]  Date of Service: 12/30/2018  Chief Complaint  Patient presents with  . Follow-up    weight management    Ms. Koc present today for weight management follow-up. She has lost nearly 40lbs since initiating phentermine in March 2020. She reports that she feels a decrease in the appetite suppressive effect of the phentermine. She denies any adverse effects from the medication. She states that she is exercising 6 days per week, including walking on a treadmill. She is also doing weight training with her mother several days a week.       Current Medication: Outpatient Encounter Medications as of 12/21/2018  Medication Sig  . albuterol (PROVENTIL HFA;VENTOLIN HFA) 108 (90 Base) MCG/ACT inhaler Inhale 2 puffs into the lungs every 6 (six) hours as needed for wheezing.  . ergocalciferol (DRISDOL) 1.25 MG (50000 UT) capsule Take 1 capsule (50,000 Units total) by mouth once a week.  Marland Kitchen ketorolac (TORADOL) 10 MG tablet Take 1 tablet (10 mg total) by mouth every 8 (eight) hours as needed for moderate pain.  . Multiple Vitamins-Minerals (MULTIVITAMIN ADULT PO) Take 1 tablet by mouth daily.  . phentermine (ADIPEX-P) 37.5 MG tablet Take 1 tablet (37.5 mg total) by mouth daily before breakfast.  . [DISCONTINUED] phentermine (ADIPEX-P) 37.5 MG tablet Take 1 tablet (37.5 mg total) by mouth daily before breakfast.   No facility-administered encounter medications on file as of 12/21/2018.     Surgical History: Past Surgical History:  Procedure Laterality Date  . BREAST CYST EXCISION Left 2014  . CESAREAN SECTION  01/11/11; 09/27/2014  . CESAREAN SECTION N/A 04/16/2012   Procedure: Cesarean Section;  Surgeon: Guss Bunde, MD;  Location: [facility];  Service: Obstetrics;  Laterality: N/A;  Wound Class (Clean contaminated)  . INSERTION OF  MESH N/A 12/01/2014   Procedure: INSERTION OF MESH;  Surgeon: Coralie Keens, MD;  Location: [facility];  Service: General;  Laterality: N/A;  . UMBILICAL HERNIA REPAIR N/A 12/01/2014   Procedure: UMBILICAL HERNIA REPAIR ;  Surgeon: Coralie Keens, MD;  Location: [facility];  Service: General;  Laterality: N/A;  . WISDOM TOOTH EXTRACTION      Medical History: Past Medical History:  Diagnosis Date  . Asthma    prn inhaler  . Migraine   . Umbilical hernia [id number]    Family History: Family History  Problem Relation Age of Onset  . Diabetes Maternal Aunt        Type 2  . Heart disease Maternal Grandmother   . Cancer Maternal Grandfather        liver cancer  . Heart disease Maternal Grandfather   . Heart disease Paternal Grandmother   . Heart disease Paternal Grandfather   . Cleft palate Son        deceased  . Breast cancer Neg Hx     Social History   Socioeconomic History  . Marital status: Married    Spouse name: Not on file  . Number of children: Not on file  . Years of education: Not on file  . Highest education level: Not on file  Occupational History  . Not on file  Social Needs  . Financial resource strain: Not on file  . Food insecurity    Worry: Not on file    Inability: Not on file  . Transportation needs  Medical: Not on file    Non-medical: Not on file  Tobacco Use  . Smoking status: Former Smoker    Packs/day: 0.00    Quit date: 12/19/2008    Years since quitting: 10.0  . Smokeless tobacco: Never Used  Substance and Sexual Activity  . Alcohol use: Yes    Alcohol/week: 3.0 standard drinks    Types: 3 Glasses of wine per week    Comment: occasionally  . Drug use: No  . Sexual activity: Yes    Partners: Male    Birth control/protection: OCP, Pill  Lifestyle  . Physical activity    Days per week: Not on file    Minutes per session: Not on file  . Stress: Not on file  Relationships  . Social Product manager on phone: Not on file    Gets together: Not on file    Attends religious service: Not on file    Active member of club or organization: Not on file    Attends meetings of clubs or organizations: Not on file    Relationship status: Not on file  . Intimate partner violence    Fear of current or ex partner: Not on file    Emotionally abused: Not on file    Physically abused: Not on file    Forced sexual activity: Not on file  Other Topics Concern  . Not on file  Social History Narrative  . Not on file      Review of Systems  Constitutional: Negative for chills, fatigue and unexpected weight change.       Six pound weight loss since her last visit.   HENT: Negative for congestion, postnasal drip, rhinorrhea, sneezing and sore throat.   Respiratory: Negative for cough, chest tightness, shortness of breath and wheezing.   Cardiovascular: Negative for chest pain and palpitations.  Gastrointestinal: Negative for abdominal pain, constipation, diarrhea, nausea and vomiting.  Endocrine: Negative for cold intolerance, heat intolerance, polydipsia and polyuria.  Musculoskeletal: Negative for arthralgias, back pain, joint swelling and neck pain.  Skin: Negative for rash.  Neurological: Negative for tremors.  Hematological: Negative for adenopathy. Does not bruise/bleed easily.  Psychiatric/Behavioral: Negative for behavioral problems (Depression), sleep disturbance and suicidal ideas. The patient is not nervous/anxious.   All other systems reviewed and are negative.   Today's Vitals   12/21/18 1102  BP: (!) 127/93  Pulse: 87  Resp: 16  Temp: 98.6 F (37 C)  SpO2: 98%  Weight: 159 lb (72.1 kg)  Height: 5\' 3"  (1.6 m)   Body mass index is 28.17 kg/m.  Physical Exam Vitals signs and nursing note reviewed.  Constitutional:      Appearance: Normal appearance.  HENT:     Head: Normocephalic.  Neck:     Musculoskeletal: Normal range of motion and neck supple.     Vascular: No  carotid bruit.  Cardiovascular:     Rate and Rhythm: Normal rate and regular rhythm.     Heart sounds: Normal heart sounds.  Pulmonary:     Effort: Pulmonary effort is normal.     Breath sounds: Normal breath sounds.  Musculoskeletal: Normal range of motion.  Skin:    General: Skin is warm and dry.  Neurological:     Mental Status: She is alert.  Psychiatric:        Mood and Affect: Mood normal.        Behavior: Behavior normal.        Thought Content:  Thought content normal.        Judgment: Judgment normal.   Assessment/Plan: 1. Other fatigue Continues to improve.   2. Body mass index 28.0-28.9, adult Will continue phentermine 37.5mg  daily for additional 30 days. Advised her to wean off medication slowly. Will break from medication as she continues to do well with weight management. Can restart in future if she begins to struggle weihg weight again.  - phentermine (ADIPEX-P) 37.5 MG tablet; Take 1 tablet (37.5 mg total) by mouth daily before breakfast.  Dispense: 30 tablet; Refill: 0  General Counseling: Scheryl verbalizes understanding of the findings of todays visit and agrees with plan of treatment. I have discussed any further diagnostic evaluation that may be needed or ordered today. We also reviewed her medications today. she has been encouraged to call the office with any questions or concerns that should arise related to todays visit.   There is a liability release in patients' chart. There has been a 10 minute discussion about the side effects including but not limited to elevated blood pressure, anxiety, lack of sleep and dry mouth. Pt understands and will like to start/continue on appetite suppressant at this time. There will be one month RX given at the time of visit with proper follow up. Nova diet plan with restricted calories is given to the pt. Pt understands and agrees with  plan of treatment  This patient was seen by Leretha Pol FNP Collaboration with Dr Lavera Guise as a part of collaborative care agreement  Meds ordered this encounter  Medications  . phentermine (ADIPEX-P) 37.5 MG tablet    Sig: Take 1 tablet (37.5 mg total) by mouth daily before breakfast.    Dispense:  30 tablet    Refill:  0    Order Specific Question:   Supervising Provider    Answer:   Lavera Guise [id number]    Time spent: 37 Minutes      Dr Lavera Guise Internal medicine

## 2018-12-30 DIAGNOSIS — Z6828 Body mass index (BMI) 28.0-28.9, adult: Secondary | ICD-10-CM | POA: Insufficient documentation

## 2019-03-28 ENCOUNTER — Other Ambulatory Visit: Payer: Self-pay

## 2019-03-28 ENCOUNTER — Encounter: Payer: Self-pay | Admitting: Adult Health

## 2019-03-28 ENCOUNTER — Ambulatory Visit: Payer: Self-pay | Admitting: Adult Health

## 2019-03-28 VITALS — BP 126/85 | HR 81 | Temp 97.9°F | Resp 16 | Ht 63.0 in | Wt 170.0 lb

## 2019-03-28 DIAGNOSIS — R350 Frequency of micturition: Secondary | ICD-10-CM

## 2019-03-28 DIAGNOSIS — Z8619 Personal history of other infectious and parasitic diseases: Secondary | ICD-10-CM

## 2019-03-28 DIAGNOSIS — R300 Dysuria: Secondary | ICD-10-CM

## 2019-03-28 LAB — POCT URINALYSIS DIPSTICK
Bilirubin, UA: NEGATIVE
Glucose, UA: NEGATIVE
Ketones, UA: NEGATIVE
Nitrite, UA: NEGATIVE
Protein, UA: POSITIVE — AB
Spec Grav, UA: 1.015 (ref 1.010–1.025)
Urobilinogen, UA: 0.2 E.U./dL
pH, UA: 6.5 (ref 5.0–8.0)

## 2019-03-28 MED ORDER — NITROFURANTOIN MONOHYD MACRO 100 MG PO CAPS: 100.0000 mg | ORAL_CAPSULE | Freq: Two times a day (BID) | ORAL | 0 refills | Status: AC

## 2019-03-28 MED ORDER — FLUCONAZOLE 150 MG PO TABS: 150.0000 mg | ORAL_TABLET | ORAL | 0 refills | Status: DC | PRN

## 2019-03-28 NOTE — Progress Notes (Signed)
[facility] [place], [place] [address]  Internal MEDICINE  Office Visit Note  Patient Name: Cindy Walker  [id number]  [id number]  Date of Service: 03/28/2019  Chief Complaint  Patient presents with  . Urinary Tract Infection     HPI Pt is here for a sick visit. She started having symptoms of a urinary tract infection 9 days ago. Symptoms started with noticing blood in her urine and some mild discomfort in her bilateral flank area, symptoms developed into 1-2 days of fevers and chills, increased frequency and feeling as though she could not completely empty her bladder. Over the weekend she took doses of OTC AZO and felt as though her symptoms had improved. Symptoms returned Tuesday and she experienced further increased urinary frequency, bladder spasms and bilateral flank pain. She denies having fevers or chills since the weekend. Also denies any new sexual partners in the last 3 months. Is currently on her menstrual cycle and is unable to determine in blood is coming from urine or menstruation.   Current Medication:  Outpatient Encounter Medications as of 03/28/2019  Medication Sig  . albuterol (PROVENTIL HFA;VENTOLIN HFA) 108 (90 Base) MCG/ACT inhaler Inhale 2 puffs into the lungs every 6 (six) hours as needed for wheezing.  . ergocalciferol (DRISDOL) 1.25 MG (50000 UT) capsule Take 1 capsule (50,000 Units total) by mouth once a week.  Marland Kitchen ketorolac (TORADOL) 10 MG tablet Take 1 tablet (10 mg total) by mouth every 8 (eight) hours as needed for moderate pain.  . Multiple Vitamins-Minerals (MULTIVITAMIN ADULT PO) Take 1 tablet by mouth daily.  . phentermine (ADIPEX-P) 37.5 MG tablet Take 1 tablet (37.5 mg total) by mouth daily before breakfast.  . fluconazole (DIFLUCAN) 150 MG tablet Take 1 tablet (150 mg total) by mouth every three (3) days as needed.  . nitrofurantoin, macrocrystal-monohydrate, (MACROBID) 100 MG capsule Take 1 capsule (100 mg total) by  mouth 2 (two) times daily for 7 days.   No facility-administered encounter medications on file as of 03/28/2019.    Medical History: Past Medical History:  Diagnosis Date  . Asthma    prn inhaler  . Migraine   . Umbilical hernia [id number]     Vital Signs: BP 126/85   Pulse 81   Temp 97.9 F (36.6 C)   Resp 16   Ht 5\' 3"  (1.6 m)   Wt 170 lb (77.1 kg)   SpO2 100%   BMI 30.11 kg/m    Review of Systems  Constitutional: Negative for chills, fatigue and unexpected weight change.  HENT: Negative for congestion, rhinorrhea, sneezing and sore throat.   Eyes: Negative for photophobia, pain and redness.  Respiratory: Negative for cough, chest tightness and shortness of breath.   Cardiovascular: Negative for chest pain and palpitations.  Gastrointestinal: Negative for abdominal pain, constipation, diarrhea, nausea and vomiting.  Endocrine: Negative.   Genitourinary: Positive for dysuria, flank pain, frequency, hematuria and urgency.  Musculoskeletal: Negative for arthralgias, back pain, joint swelling and neck pain.  Skin: Negative for rash.  Allergic/Immunologic: Negative.   Neurological: Negative for tremors and numbness.  Hematological: Negative for adenopathy. Does not bruise/bleed easily.  Psychiatric/Behavioral: Negative for behavioral problems and sleep disturbance. The patient is not nervous/anxious.     Physical Exam Vitals and nursing note reviewed.  Constitutional:      General: She is not in acute distress.    Appearance: She is well-developed. She is not diaphoretic.  HENT:     Head: Normocephalic and  atraumatic.     Mouth/Throat:     Pharynx: No oropharyngeal exudate.  Eyes:     Pupils: Pupils are equal, round, and reactive to light.  Neck:     Thyroid: No thyromegaly.     Vascular: No JVD.     Trachea: No tracheal deviation.  Cardiovascular:     Rate and Rhythm: Normal rate and regular rhythm.     Heart sounds: Normal heart sounds. No murmur. No friction  rub. No gallop.   Pulmonary:     Effort: Pulmonary effort is normal. No respiratory distress.     Breath sounds: Normal breath sounds. No wheezing or rales.  Chest:     Chest wall: No tenderness.  Abdominal:     Palpations: Abdomen is soft.     Tenderness: There is no abdominal tenderness. There is no guarding.  Musculoskeletal:        General: Normal range of motion.     Cervical back: Normal range of motion and neck supple.  Lymphadenopathy:     Cervical: No cervical adenopathy.  Skin:    General: Skin is warm and dry.  Neurological:     Mental Status: She is alert and oriented to person, place, and time.     Cranial Nerves: No cranial nerve deficit.  Psychiatric:        Behavior: Behavior normal.        Thought Content: Thought content normal.        Judgment: Judgment normal.    Assessment/Plan: 1. Urinary frequency 9 days of symptomatic acute cystitis. Increased urinary frequency, feelings as though cannot completely empty bladder and flank pain. - nitrofurantoin, macrocrystal-monohydrate, (MACROBID) 100 MG capsule; Take 1 capsule (100 mg total) by mouth 2 (two) times daily for 7 days.  Dispense: 14 capsule; Refill: 0  2. Dysuria Hold off on sending urine culture as patient has to pay out of pocket and states financial difficulties. Encouraged her to report signs and symptoms or recurrent infection after completing antibiotic as well as no improvement in symptoms or worsening symptoms. - POCT Urinalysis Dipstick  3. History of candidiasis of vagina Reports history of vaginal yeast infection in the past when being on antibiotics. - fluconazole (DIFLUCAN) 150 MG tablet; Take 1 tablet (150 mg total) by mouth every three (3) days as needed.  Dispense: 3 tablet; Refill: 0   General Counseling: Itzela verbalizes understanding of the findings of todays visit and agrees with plan of treatment. I have discussed any further diagnostic evaluation that may be needed or ordered today.  We also reviewed her medications today. she has been encouraged to call the office with any questions or concerns that should arise related to todays visit.   Orders Placed This Encounter  Procedures  . POCT Urinalysis Dipstick    Meds ordered this encounter  Medications  . nitrofurantoin, macrocrystal-monohydrate, (MACROBID) 100 MG capsule    Sig: Take 1 capsule (100 mg total) by mouth 2 (two) times daily for 7 days.    Dispense:  14 capsule    Refill:  0  . fluconazole (DIFLUCAN) 150 MG tablet    Sig: Take 1 tablet (150 mg total) by mouth every three (3) days as needed.    Dispense:  3 tablet    Refill:  0    Time spent: 25 Minutes  This patient was seen by Orson Gear AGNP-C in Collaboration with Dr Lavera Guise as a part of collaborative care agreement.  Kendell Bane AGNP-C Internal  Medicine

## 2019-04-03 ENCOUNTER — Telehealth: Payer: Self-pay

## 2019-04-03 NOTE — Telephone Encounter (Signed)
Confirmed appointment with patient. klh °

## 2019-04-05 ENCOUNTER — Ambulatory Visit: Payer: BLUE CROSS/BLUE SHIELD | Admitting: Adult Health

## 2019-04-05 ENCOUNTER — Encounter: Payer: Self-pay | Admitting: Adult Health

## 2019-04-05 VITALS — Ht 63.0 in | Wt 165.0 lb

## 2019-04-05 DIAGNOSIS — E559 Vitamin D deficiency, unspecified: Secondary | ICD-10-CM | POA: Diagnosis not present

## 2019-04-05 DIAGNOSIS — R5383 Other fatigue: Secondary | ICD-10-CM | POA: Diagnosis not present

## 2019-04-05 DIAGNOSIS — Z6828 Body mass index (BMI) 28.0-28.9, adult: Secondary | ICD-10-CM

## 2019-04-05 DIAGNOSIS — Z0001 Encounter for general adult medical examination with abnormal findings: Secondary | ICD-10-CM

## 2019-04-05 MED ORDER — PHENTERMINE HCL 37.5 MG PO CAPS: 37.5000 mg | ORAL_CAPSULE | ORAL | 0 refills | Status: DC

## 2019-04-05 MED ORDER — PHENTERMINE HCL 37.5 MG PO TABS: 37.5000 mg | ORAL_TABLET | Freq: Every day | ORAL | 0 refills | Status: DC

## 2019-04-05 NOTE — Progress Notes (Signed)
[facility] [place], [place] [address]  Internal MEDICINE  Telephone Visit  Patient Name: Cindy Walker  [id number]  [id number]  Date of Service: 04/05/2019  I connected with the patient at 940 by telephone and verified the patients identity using two identifiers.   I discussed the limitations, risks, security and privacy concerns of performing an evaluation and management service by telephone and the availability of in person appointments. I also discussed with the patient that there may be a patient responsible charge related to the service.  The patient expressed understanding and agrees to proceed.    Chief Complaint  Patient presents with  . Telephone Assessment  . Telephone Screen  . Medical Management of Chronic Issues    WEIGHT MANAGEMENT     HPI  Pt seen via video.  She reports she has been off phentermine since October 2020.  She reports she continues to exercise 6-7 days a week.  She continues to drink water and feels like she is doing well.  She has gained 6 pounds since October.  She reports she feels like her appetite has been uncontrollable lately, and she would like to start back on the phentermine at this time.    Current Medication: Outpatient Encounter Medications as of 04/05/2019  Medication Sig  . albuterol (PROVENTIL HFA;VENTOLIN HFA) 108 (90 Base) MCG/ACT inhaler Inhale 2 puffs into the lungs every 6 (six) hours as needed for wheezing.  . ergocalciferol (DRISDOL) 1.25 MG (50000 UT) capsule Take 1 capsule (50,000 Units total) by mouth once a week.  . fluconazole (DIFLUCAN) 150 MG tablet Take 1 tablet (150 mg total) by mouth every three (3) days as needed.  Marland Kitchen ketorolac (TORADOL) 10 MG tablet Take 1 tablet (10 mg total) by mouth every 8 (eight) hours as needed for moderate pain.  . Multiple Vitamins-Minerals (MULTIVITAMIN ADULT PO) Take 1 tablet by mouth daily.  . phentermine (ADIPEX-P) 37.5 MG tablet Take 1 tablet (37.5 mg total)  by mouth daily before breakfast.  . [DISCONTINUED] phentermine (ADIPEX-P) 37.5 MG tablet Take 1 tablet (37.5 mg total) by mouth daily before breakfast.  . [START ON 05/05/2019] phentermine 37.5 MG capsule Take 1 capsule (37.5 mg total) by mouth every morning.   No facility-administered encounter medications on file as of 04/05/2019.    Surgical History: Past Surgical History:  Procedure Laterality Date  . BREAST CYST EXCISION Left 2014  . CESAREAN SECTION  01/11/11; 09/27/2014  . CESAREAN SECTION N/A 04/16/2012   Procedure: Cesarean Section;  Surgeon: Guss Bunde, MD;  Location: [facility];  Service: Obstetrics;  Laterality: N/A;  Wound Class (Clean contaminated)  . INSERTION OF MESH N/A 12/01/2014   Procedure: INSERTION OF MESH;  Surgeon: Coralie Keens, MD;  Location: [facility];  Service: General;  Laterality: N/A;  . UMBILICAL HERNIA REPAIR N/A 12/01/2014   Procedure: UMBILICAL HERNIA REPAIR ;  Surgeon: Coralie Keens, MD;  Location: [facility];  Service: General;  Laterality: N/A;  . WISDOM TOOTH EXTRACTION      Medical History: Past Medical History:  Diagnosis Date  . Asthma    prn inhaler  . Migraine   . Umbilical hernia [id number]    Family History: Family History  Problem Relation Age of Onset  . Diabetes Maternal Aunt        Type 2  . Heart disease Maternal Grandmother   . Cancer Maternal Grandfather        liver cancer  . Heart  disease Maternal Grandfather   . Heart disease Paternal Grandmother   . Heart disease Paternal Grandfather   . Cleft palate Son        deceased  . Breast cancer Neg Hx     Social History   Socioeconomic History  . Marital status: Married    Spouse name: Not on file  . Number of children: Not on file  . Years of education: Not on file  . Highest education level: Not on file  Occupational History  . Not on file  Tobacco Use  . Smoking status: Former Smoker    Packs/day: 0.00    Quit date: 12/19/2008     Years since quitting: 10.2  . Smokeless tobacco: Never Used  Substance and Sexual Activity  . Alcohol use: Yes    Alcohol/week: 3.0 standard drinks    Types: 3 Glasses of wine per week    Comment: occasionally  . Drug use: No  . Sexual activity: Yes    Partners: Male    Birth control/protection: OCP, Pill  Other Topics Concern  . Not on file  Social History Narrative  . Not on file   Social Determinants of Health   Financial Resource Strain:   . Difficulty of Paying Living Expenses: Not on file  Food Insecurity:   . Worried About Charity fundraiser in the Last Year: Not on file  . Ran Out of Food in the Last Year: Not on file  Transportation Needs:   . Lack of Transportation (Medical): Not on file  . Lack of Transportation (Non-Medical): Not on file  Physical Activity:   . Days of Exercise per Week: Not on file  . Minutes of Exercise per Session: Not on file  Stress:   . Feeling of Stress : Not on file  Social Connections:   . Frequency of Communication with Friends and Family: Not on file  . Frequency of Social Gatherings with Friends and Family: Not on file  . Attends Religious Services: Not on file  . Active Member of Clubs or Organizations: Not on file  . Attends Archivist Meetings: Not on file  . Marital Status: Not on file  Intimate Partner Violence:   . Fear of Current or Ex-Partner: Not on file  . Emotionally Abused: Not on file  . Physically Abused: Not on file  . Sexually Abused: Not on file      Review of Systems  Vital Signs: Ht 5\' 3"  (1.6 m)   Wt 165 lb (74.8 kg)   BMI 29.23 kg/m    Observation/Objective:  Well appearing, NAD noted   Assessment/Plan:  1. Other fatigue Improving with weight loss, will check labs now for physical next month.  - Vitamin D 1,25 dihydroxy - B12 and Folate Panel - Fe+TIBC+Fer  2. Body mass index 28.0-28.9, adult Obesity Counseling: Risk Assessment: An assessment of behavioral risk factors was  made today and includes lack of exercise sedentary lifestyle, lack of portion control and poor dietary habits.  Risk Modification Advice: She was counseled on portion control guidelines. Restricting daily caloric intake to 1600. The detrimental long term effects of obesity on her health and ongoing poor compliance was also discussed with the patient.  There is a liability release in patients' chart. There has been a 10 minute discussion about the side effects including but not limited to elevated blood pressure, anxiety, lack of sleep and dry mouth. Pt understands and will like to start/continue on appetite suppressant at this  time. There will be one month RX given at the time of visit with proper follow up. Nova diet plan with restricted calories is given to the pt. Pt understands and agrees with  plan of treatment - phentermine (ADIPEX-P) 37.5 MG tablet; Take 1 tablet (37.5 mg total) by mouth daily before breakfast.  Dispense: 30 tablet; Refill: 0  3. Vitamin D deficiency Check vit D level, is currently on supplement.     General Counseling: merianne almaraz understanding of the findings of today's phone visit and agrees with plan of treatment. I have discussed any further diagnostic evaluation that may be needed or ordered today. We also reviewed her medications today. she has been encouraged to call the office with any questions or concerns that should arise related to todays visit.    Orders Placed This Encounter  Procedures  . CBC with Differential/Platelet  . Lipid Panel With LDL/HDL Ratio  . TSH  . T4, free  . Comprehensive metabolic panel  . Vitamin D 1,25 dihydroxy  . B12 and Folate Panel  . Fe+TIBC+Fer    Meds ordered this encounter  Medications  . phentermine (ADIPEX-P) 37.5 MG tablet    Sig: Take 1 tablet (37.5 mg total) by mouth daily before breakfast.    Dispense:  30 tablet    Refill:  0  . phentermine 37.5 MG capsule    Sig: Take 1 capsule (37.5 mg total) by  mouth every morning.    Dispense:  30 capsule    Refill:  0    Do not fill before 05/05/2019 thank you    Time spent: [place] AGNP-C Internal medicine

## 2019-04-29 ENCOUNTER — Other Ambulatory Visit: Payer: Self-pay

## 2019-04-29 DIAGNOSIS — E559 Vitamin D deficiency, unspecified: Secondary | ICD-10-CM

## 2019-04-29 MED ORDER — ERGOCALCIFEROL 1.25 MG (50000 UT) PO CAPS: 50000.0000 [IU] | ORAL_CAPSULE | ORAL | 0 refills | Status: DC

## 2019-05-23 ENCOUNTER — Telehealth: Payer: Self-pay

## 2019-05-23 NOTE — Telephone Encounter (Signed)
CONFIRMED AND SCREENED FOR 05-27-19 OV.

## 2019-05-27 ENCOUNTER — Other Ambulatory Visit: Payer: Self-pay | Admitting: Nurse Practitioner

## 2019-05-27 ENCOUNTER — Ambulatory Visit (INDEPENDENT_AMBULATORY_CARE_PROVIDER_SITE_OTHER): Payer: BLUE CROSS/BLUE SHIELD | Admitting: Nurse Practitioner

## 2019-05-27 ENCOUNTER — Other Ambulatory Visit: Payer: Self-pay

## 2019-05-27 ENCOUNTER — Encounter: Payer: Self-pay | Admitting: Nurse Practitioner

## 2019-05-27 VITALS — BP 135/88 | HR 81 | Temp 97.8°F | Resp 16 | Ht 63.0 in | Wt 170.6 lb

## 2019-05-27 DIAGNOSIS — Z6830 Body mass index (BMI) 30.0-30.9, adult: Secondary | ICD-10-CM

## 2019-05-27 DIAGNOSIS — N6315 Unspecified lump in the right breast, overlapping quadrants: Secondary | ICD-10-CM | POA: Insufficient documentation

## 2019-05-27 DIAGNOSIS — Z0001 Encounter for general adult medical examination with abnormal findings: Secondary | ICD-10-CM | POA: Diagnosis not present

## 2019-05-27 DIAGNOSIS — R5383 Other fatigue: Secondary | ICD-10-CM | POA: Diagnosis not present

## 2019-05-27 DIAGNOSIS — R300 Dysuria: Secondary | ICD-10-CM

## 2019-05-27 MED ORDER — PHENDIMETRAZINE TARTRATE ER 105 MG PO CP24: 105.0000 mg | ORAL_CAPSULE | Freq: Every day | ORAL | 1 refills | Status: DC

## 2019-05-27 NOTE — Progress Notes (Signed)
[facility] [place], [place] [address]  Internal MEDICINE  Office Visit Note  Patient Name: Cindy Walker  [id number]  [id number]  Date of Service: 05/27/2019   Pt is here for routine health maintenance examination   Chief Complaint  Patient presents with  . Annual Exam  . Asthma     The patient is here for annual wellness visit. She was restarted on phentermine at her last, virtual visit. She has actually gained a pound according to her scale at home.  She does not feel as though it is working like it did when she first started on it. Had been off of it for a few months before restarting it, and was disappointed in it's lack of efficacy. She has no negative side effects associated with taking phentermine. She continues her daily exercise routine. She did have her routine, fasting labs done prior to this visit, including an anemia panel. B12 level was low/normal. LDL and total cholesterol were slightly elevated, but LDL/HDL ratio was good at 2.0.   Current Medication: Outpatient Encounter Medications as of 05/27/2019  Medication Sig  . albuterol (PROVENTIL HFA;VENTOLIN HFA) 108 (90 Base) MCG/ACT inhaler Inhale 2 puffs into the lungs every 6 (six) hours as needed for wheezing.  . ergocalciferol (DRISDOL) 1.25 MG (50000 UT) capsule Take 1 capsule (50,000 Units total) by mouth once a week.  . fluconazole (DIFLUCAN) 150 MG tablet Take 1 tablet (150 mg total) by mouth every three (3) days as needed.  Marland Kitchen ketorolac (TORADOL) 10 MG tablet Take 1 tablet (10 mg total) by mouth every 8 (eight) hours as needed for moderate pain.  . Multiple Vitamins-Minerals (MULTIVITAMIN ADULT PO) Take 1 tablet by mouth daily.  . [DISCONTINUED] phentermine (ADIPEX-P) 37.5 MG tablet Take 1 tablet (37.5 mg total) by mouth daily before breakfast.  . [DISCONTINUED] phentermine 37.5 MG capsule Take 1 capsule (37.5 mg total) by mouth every morning.  . Phendimetrazine Tartrate 105 MG CP24  Take 1 capsule (105 mg total) by mouth daily.   No facility-administered encounter medications on file as of 05/27/2019.    Surgical History: Past Surgical History:  Procedure Laterality Date  . BREAST CYST EXCISION Left 2014  . CESAREAN SECTION  01/11/11; 09/27/2014  . CESAREAN SECTION N/A 04/16/2012   Procedure: Cesarean Section;  Surgeon: Guss Bunde, MD;  Location: [facility];  Service: Obstetrics;  Laterality: N/A;  Wound Class (Clean contaminated)  . INSERTION OF MESH N/A 12/01/2014   Procedure: INSERTION OF MESH;  Surgeon: Coralie Keens, MD;  Location: [place];  Service: General;  Laterality: N/A;  . UMBILICAL HERNIA REPAIR N/A 12/01/2014   Procedure: UMBILICAL HERNIA REPAIR ;  Surgeon: Coralie Keens, MD;  Location: [place];  Service: General;  Laterality: N/A;  . WISDOM TOOTH EXTRACTION      Medical History: Past Medical History:  Diagnosis Date  . Asthma    prn inhaler  . Migraine   . Umbilical hernia [id number]    Family History: Family History  Problem Relation Age of Onset  . Diabetes Maternal Aunt        Type 2  . Heart disease Maternal Grandmother   . Cancer Maternal Grandfather        liver cancer  . Heart disease Maternal Grandfather   . Heart disease Paternal Grandmother   . Heart disease Paternal Grandfather   . Cleft palate Son        deceased  . Breast cancer  Neg Hx       Review of Systems  Constitutional: Negative for activity change, chills, fatigue and unexpected weight change.  HENT: Negative for congestion, postnasal drip, rhinorrhea, sneezing and sore throat.   Respiratory: Negative for cough, chest tightness, shortness of breath and wheezing.   Cardiovascular: Negative for chest pain and palpitations.  Gastrointestinal: Negative for abdominal pain, constipation, diarrhea, nausea and vomiting.  Endocrine: Negative for cold intolerance, heat intolerance, polydipsia and polyuria.  Genitourinary: Negative for  dysuria, frequency and menstrual problem.  Musculoskeletal: Negative for arthralgias, back pain, joint swelling and neck pain.  Skin: Negative for rash.  Allergic/Immunologic: Negative for environmental allergies.  Neurological: Negative for dizziness, tremors, numbness and headaches.  Hematological: Negative for adenopathy. Does not bruise/bleed easily.  Psychiatric/Behavioral: Negative for behavioral problems (Depression), sleep disturbance and suicidal ideas. The patient is not nervous/anxious.      Today's Vitals   05/27/19 1021  BP: 135/88  Pulse: 81  Resp: 16  Temp: 97.8 F (36.6 C)  SpO2: 100%  Weight: 170 lb 9.6 oz (77.4 kg)  Height: 5\' 3"  (1.6 m)   Body mass index is 30.22 kg/m.  Physical Exam Vitals and nursing note reviewed.  Constitutional:      General: She is not in acute distress.    Appearance: Normal appearance. She is well-developed. She is not diaphoretic.  HENT:     Head: Normocephalic and atraumatic.     Nose: Nose normal.     Mouth/Throat:     Pharynx: No oropharyngeal exudate.  Eyes:     Conjunctiva/sclera: Conjunctivae normal.     Pupils: Pupils are equal, round, and reactive to light.  Neck:     Thyroid: No thyromegaly.     Vascular: No JVD.     Trachea: No tracheal deviation.  Cardiovascular:     Rate and Rhythm: Normal rate and regular rhythm.     Heart sounds: Normal heart sounds. No murmur. No friction rub. No gallop.   Pulmonary:     Effort: Pulmonary effort is normal. No respiratory distress.     Breath sounds: Normal breath sounds. No wheezing or rales.  Chest:     Chest wall: No tenderness.     Breasts:        Right: Mass present. No swelling, bleeding, inverted nipple, nipple discharge, skin change or tenderness.        Left: Normal. No swelling, bleeding, inverted nipple, mass, nipple discharge, skin change or tenderness.    Abdominal:     General: Bowel sounds are normal.     Palpations: Abdomen is soft.     Tenderness:  There is no abdominal tenderness.  Musculoskeletal:        General: Normal range of motion.     Cervical back: Normal range of motion and neck supple.  Lymphadenopathy:     Cervical: No cervical adenopathy.     Upper Body:     Right upper body: No axillary adenopathy.     Left upper body: No axillary adenopathy.  Skin:    General: Skin is warm and dry.     Capillary Refill: Capillary refill takes less than 2 seconds.  Neurological:     Mental Status: She is alert and oriented to person, place, and time.     Cranial Nerves: No cranial nerve deficit.  Psychiatric:        Mood and Affect: Mood normal.        Behavior: Behavior normal.  Thought Content: Thought content normal.        Judgment: Judgment normal.      LABS: Recent Results (from the past 2160 hour(s))  POCT Urinalysis Dipstick     Status: Abnormal   Collection Time: 03/28/19 10:07 AM  Result Value Ref Range   Color, UA     Clarity, UA     Glucose, UA Negative Negative   Bilirubin, UA negative    Ketones, UA negative    Spec Grav, UA 1.015 1.010 - 1.025   Blood, UA large    pH, UA 6.5 5.0 - 8.0   Protein, UA Positive (A) Negative    Comment: +30   Urobilinogen, UA 0.2 0.2 or 1.0 E.U./dL   Nitrite, UA negative    Leukocytes, UA Moderate (2+) (A) Negative   Appearance     Odor    CBC with Differential/Platelet     Status: None   Collection Time: 05/21/19  8:12 AM  Result Value Ref Range   WBC 6.5 3.4 - 10.8 x10E3/uL   RBC 4.49 3.77 - 5.28 x10E6/uL   Hemoglobin 13.1 11.1 - 15.9 g/dL   Hematocrit 39.6 34.0 - 46.6 %   MCV 88 79 - 97 fL   MCH 29.2 26.6 - 33.0 pg   MCHC 33.1 31.5 - 35.7 g/dL   RDW 13.3 11.7 - 15.4 %   Platelets 350 150 - 450 x10E3/uL   Neutrophils 51 Not Estab. %   Lymphs 35 Not Estab. %   Monocytes 7 Not Estab. %   Eos 6 Not Estab. %   Basos 1 Not Estab. %   Neutrophils Absolute 3.3 1.4 - 7.0 x10E3/uL   Lymphocytes Absolute 2.2 0.7 - 3.1 x10E3/uL   Monocytes Absolute 0.5 0.1 -  0.9 x10E3/uL   EOS (ABSOLUTE) 0.4 0.0 - 0.4 x10E3/uL   Basophils Absolute 0.1 0.0 - 0.2 x10E3/uL   Immature Granulocytes 0 Not Estab. %   Immature Grans (Abs) 0.0 0.0 - 0.1 x10E3/uL  Lipid Panel With LDL/HDL Ratio     Status: Abnormal   Collection Time: 05/21/19  8:12 AM  Result Value Ref Range   Cholesterol, Total 206 (H) 100 - 199 mg/dL   Triglycerides 103 0 - 149 mg/dL   HDL 62 >39 mg/dL   VLDL Cholesterol Cal 18 5 - 40 mg/dL   LDL Chol Calc (NIH) 126 (H) 0 - 99 mg/dL   LDL/HDL Ratio 2.0 0.0 - 3.2 ratio    Comment:                                     LDL/HDL Ratio                                             Men  Women                               1/2 Avg.Risk  1.0    1.5                                   Avg.Risk  3.6    3.2  2X Avg.Risk  6.2    5.0                                3X Avg.Risk  8.0    6.1   TSH     Status: None   Collection Time: 05/21/19  8:12 AM  Result Value Ref Range   TSH 1.530 0.450 - 4.500 uIU/mL  T4, free     Status: None   Collection Time: 05/21/19  8:12 AM  Result Value Ref Range   Free T4 1.11 0.82 - 1.77 ng/dL  Comprehensive metabolic panel     Status: Abnormal   Collection Time: 05/21/19  8:12 AM  Result Value Ref Range   Glucose 83 65 - 99 mg/dL   BUN 15 6 - 20 mg/dL   Creatinine, Ser 0.94 0.57 - 1.00 mg/dL   GFR calc non Af Amer 79 >59 mL/min/1.73   GFR calc Af Amer 91 >59 mL/min/1.73   BUN/Creatinine Ratio 16 9 - 23   Sodium 141 134 - 144 mmol/L   Potassium 4.4 3.5 - 5.2 mmol/L   Chloride 100 96 - 106 mmol/L   CO2 25 20 - 29 mmol/L   Calcium 10.0 8.7 - 10.2 mg/dL   Total Protein 7.6 6.0 - 8.5 g/dL   Albumin 5.0 (H) 3.8 - 4.8 g/dL   Globulin, Total 2.6 1.5 - 4.5 g/dL   Albumin/Globulin Ratio 1.9 1.2 - 2.2   Bilirubin Total 0.4 0.0 - 1.2 mg/dL   Alkaline Phosphatase 46 39 - 117 IU/L   AST 17 0 - 40 IU/L   ALT 17 0 - 32 IU/L  Vitamin D 1,25 dihydroxy     Status: None (Preliminary result)   Collection  Time: 05/21/19  8:12 AM  Result Value Ref Range   Vitamin D 1, 25 (OH)2 Total WILL FOLLOW    Vitamin D2 1, 25 (OH)2 WILL FOLLOW    Vitamin D3 1, 25 (OH)2 WILL FOLLOW   B12 and Folate Panel     Status: None   Collection Time: 05/21/19  8:12 AM  Result Value Ref Range   Vitamin B-12 351 232 - 1,245 pg/mL   Folate 12.0 >3.0 ng/mL    Comment: A serum folate concentration of less than 3.1 ng/mL is considered to represent clinical deficiency.   Fe+TIBC+Fer     Status: None   Collection Time: 05/21/19  8:12 AM  Result Value Ref Range   Total Iron Binding Capacity 338 250 - 450 ug/dL   UIBC 179 131 - 425 ug/dL   Iron 159 27 - 159 ug/dL   Iron Saturation 47 15 - 55 %   Ferritin 40 15 - 150 ng/mL   Assessment/Plan: 1. Encounter for general adult medical examination with abnormal findings Annual health maintenance exam today.   2. Other fatigue Reviewed lab results with the patient. She has low/normal B12 level. Recommend OTC vitamin B12 or Bcomlplex vitamin every day.   3. BMI 30.0-30.9,adult Change phentermine to Bontril SR 105mg  daily. Limit calorie intake to 1200 calories daily and continue to incorporate exercise into dally routine.  - Phendimetrazine Tartrate 105 MG CP24; Take 1 capsule (105 mg total) by mouth daily.  Dispense: 30 capsule; Refill: 1  4. Breast lump on right side at 6 o'clock position Palpable mass, approximately 92mm in diameter, at 6 o'clock position of right breast. Will get ultrasound of the area for further evaluation.  -  US BREAST LTD UNI RIGHT INC AXILLA; Future  5. Dysuria - UA/M w/rflx Culture, Routine  General Counseling: Meritt verbalizes understanding of the findings of todays visit and agrees with plan of treatment. I have discussed any further diagnostic evaluation that may be needed or ordered today. We also reviewed her medications today. she has been encouraged to call the office with any questions or concerns that should arise related to todays  visit.    Counseling:  This patient was seen by Leretha Pol FNP Collaboration with Dr Lavera Guise as a part of collaborative care agreement  Orders Placed This Encounter  Procedures  . US BREAST LTD UNI RIGHT INC AXILLA  . UA/M w/rflx Culture, Routine    Meds ordered this encounter  Medications  . Phendimetrazine Tartrate 105 MG CP24    Sig: Take 1 capsule (105 mg total) by mouth daily.    Dispense:  30 capsule    Refill:  1    Order Specific Question:   Supervising Provider    Answer:   Lavera Guise [id number]    Total time spent: 23 Minutes  Time spent includes review of chart, medications, test results, and follow up plan with the patient.     Lavera Guise, MD  Internal Medicine

## 2019-05-28 LAB — UA/M W/RFLX CULTURE, ROUTINE
Bilirubin, UA: NEGATIVE
Glucose, UA: NEGATIVE
Ketones, UA: NEGATIVE
Leukocytes,UA: NEGATIVE
Nitrite, UA: NEGATIVE
Protein,UA: NEGATIVE
RBC, UA: NEGATIVE
Specific Gravity, UA: 1.013 (ref 1.005–1.030)
Urobilinogen, Ur: 0.2 mg/dL (ref 0.2–1.0)
pH, UA: 7.5 (ref 5.0–7.5)

## 2019-05-28 LAB — MICROSCOPIC EXAMINATION
Casts: NONE SEEN /lpf
Epithelial Cells (non renal): >10 /hpf — AB (ref 0–10)
WBC, UA: NONE SEEN /hpf (ref 0–5)

## 2019-05-30 LAB — IRON,TIBC AND FERRITIN PANEL
Ferritin: 40 ng/mL (ref 15–150)
Iron Saturation: 47 % (ref 15–55)
Iron: 159 ug/dL (ref 27–159)
Total Iron Binding Capacity: 338 ug/dL (ref 250–450)
UIBC: 179 ug/dL (ref 131–425)

## 2019-05-30 LAB — COMPREHENSIVE METABOLIC PANEL
ALT: 17 IU/L (ref 0–32)
AST: 17 IU/L (ref 0–40)
Albumin/Globulin Ratio: 1.9 (ref 1.2–2.2)
Albumin: 5 g/dL — ABNORMAL HIGH (ref 3.8–4.8)
Alkaline Phosphatase: 46 IU/L (ref 39–117)
BUN/Creatinine Ratio: 16 (ref 9–23)
BUN: 15 mg/dL (ref 6–20)
Bilirubin Total: 0.4 mg/dL (ref 0.0–1.2)
CO2: 25 mmol/L (ref 20–29)
Calcium: 10 mg/dL (ref 8.7–10.2)
Chloride: 100 mmol/L (ref 96–106)
Creatinine, Ser: 0.94 mg/dL (ref 0.57–1.00)
GFR calc Af Amer: 91 mL/min/{1.73_m2} (ref >59)
GFR calc non Af Amer: 79 mL/min/{1.73_m2} (ref >59)
Globulin, Total: 2.6 g/dL (ref 1.5–4.5)
Glucose: 83 mg/dL (ref 65–99)
Potassium: 4.4 mmol/L (ref 3.5–5.2)
Sodium: 141 mmol/L (ref 134–144)
Total Protein: 7.6 g/dL (ref 6.0–8.5)

## 2019-05-30 LAB — B12 AND FOLATE PANEL
Folate: 12 ng/mL (ref >3.0)
Vitamin B-12: 351 pg/mL (ref 232–1245)

## 2019-05-30 LAB — CBC WITH DIFFERENTIAL/PLATELET
Basophils Absolute: 0.1 10*3/uL (ref 0.0–0.2)
Basos: 1 %
EOS (ABSOLUTE): 0.4 10*3/uL (ref 0.0–0.4)
Eos: 6 %
Hematocrit: 39.6 % (ref 34.0–46.6)
Hemoglobin: 13.1 g/dL (ref 11.1–15.9)
Immature Grans (Abs): 0 10*3/uL (ref 0.0–0.1)
Immature Granulocytes: 0 %
Lymphocytes Absolute: 2.2 10*3/uL (ref 0.7–3.1)
Lymphs: 35 %
MCH: 29.2 pg (ref 26.6–33.0)
MCHC: 33.1 g/dL (ref 31.5–35.7)
MCV: 88 fL (ref 79–97)
Monocytes Absolute: 0.5 10*3/uL (ref 0.1–0.9)
Monocytes: 7 %
Neutrophils Absolute: 3.3 10*3/uL (ref 1.4–7.0)
Neutrophils: 51 %
Platelets: 350 10*3/uL (ref 150–450)
RBC: 4.49 x10E6/uL (ref 3.77–5.28)
RDW: 13.3 % (ref 11.7–15.4)
WBC: 6.5 10*3/uL (ref 3.4–10.8)

## 2019-05-30 LAB — LIPID PANEL WITH LDL/HDL RATIO
Cholesterol, Total: 206 mg/dL — ABNORMAL HIGH (ref 100–199)
HDL: 62 mg/dL (ref >39)
LDL Chol Calc (NIH): 126 mg/dL — ABNORMAL HIGH (ref 0–99)
LDL/HDL Ratio: 2 ratio (ref 0.0–3.2)
Triglycerides: 103 mg/dL (ref 0–149)
VLDL Cholesterol Cal: 18 mg/dL (ref 5–40)

## 2019-05-30 LAB — TSH: TSH: 1.53 u[IU]/mL (ref 0.450–4.500)

## 2019-05-30 LAB — VITAMIN D 1,25 DIHYDROXY
Vitamin D 1, 25 (OH)2 Total: 40 pg/mL
Vitamin D2 1, 25 (OH)2: 16 pg/mL
Vitamin D3 1, 25 (OH)2: 24 pg/mL

## 2019-05-30 LAB — T4, FREE: Free T4: 1.11 ng/dL (ref 0.82–1.77)

## 2019-06-03 NOTE — Progress Notes (Signed)
Ok. I see her back 06/25/2019 and will review with her then.

## 2019-06-05 ENCOUNTER — Ambulatory Visit
Admission: RE | Admit: 2019-06-05 | Discharge: 2019-06-05 | Disposition: A | Discharge status: Home / Self Care | Payer: BLUE CROSS/BLUE SHIELD | Source: Ambulatory Visit | Attending: Nurse Practitioner | Admitting: Nurse Practitioner

## 2019-06-05 DIAGNOSIS — N6315 Unspecified lump in the right breast, overlapping quadrants: Secondary | ICD-10-CM

## 2019-06-06 NOTE — Progress Notes (Signed)
Negative ultrasound

## 2019-06-06 NOTE — Progress Notes (Signed)
Negative diagnostic ultrasound.

## 2019-06-20 ENCOUNTER — Telehealth: Payer: Self-pay

## 2019-06-20 NOTE — Telephone Encounter (Signed)
Confirmed and screened for 06-25-19 ov.

## 2019-06-24 ENCOUNTER — Telehealth: Payer: Self-pay

## 2019-06-24 NOTE — Telephone Encounter (Signed)
Patient cancelled appointment on 06/25/2019 did not want to reschedule at this time. klh

## 2019-06-25 ENCOUNTER — Ambulatory Visit: Payer: BLUE CROSS/BLUE SHIELD | Admitting: Nurse Practitioner

## 2019-09-04 ENCOUNTER — Telehealth: Payer: Self-pay

## 2019-09-04 NOTE — Telephone Encounter (Signed)
Lmom to confirm and screen for 09-06-19 ov.

## 2019-09-06 ENCOUNTER — Other Ambulatory Visit: Payer: Self-pay

## 2019-09-06 ENCOUNTER — Ambulatory Visit: Payer: BLUE CROSS/BLUE SHIELD | Admitting: Nurse Practitioner

## 2019-09-06 ENCOUNTER — Encounter: Payer: Self-pay | Admitting: Nurse Practitioner

## 2019-09-06 VITALS — BP 123/74 | HR 81 | Temp 97.5°F | Resp 16 | Ht 63.0 in | Wt 175.4 lb

## 2019-09-06 DIAGNOSIS — G43109 Migraine with aura, not intractable, without status migrainosus: Secondary | ICD-10-CM | POA: Diagnosis not present

## 2019-09-06 DIAGNOSIS — N920 Excessive and frequent menstruation with regular cycle: Secondary | ICD-10-CM | POA: Diagnosis not present

## 2019-09-06 DIAGNOSIS — Z30011 Encounter for initial prescription of contraceptive pills: Secondary | ICD-10-CM

## 2019-09-06 HISTORY — DX: Excessive and frequent menstruation with regular cycle: N92.0

## 2019-09-06 MED ORDER — DROSPIRENONE-ETHINYL ESTRADIOL 3-0.02 MG PO TABS: 1.0000 | ORAL_TABLET | Freq: Every day | ORAL | 11 refills | Status: DC

## 2019-09-06 NOTE — Progress Notes (Signed)
[facility] [place], [place] [address]  Internal MEDICINE  Office Visit Note  Patient Name: Cindy Walker  [id number]  [id number]  Date of Service: 09/06/2019  Chief Complaint  Patient presents with  . Menstrual Problem    Heavier periods, more cramps; possibly wants to begin birth control again    The patient is here for routine visit. She states that her menstrual cycles have been getting heavier, closer together, and lasting longer. She also states that she gets more severe cramping with each cycle. She states that she used to take birth control pills and her cycles were so much better while on birth control pills. She does have history of migraine headaches. Sometimes, she does have aura related to migraine headaches. She gets them about twice per month. They are related to seasons and tension more than anything. She does not smoke.       Current Medication: Outpatient Encounter Medications as of 09/06/2019  Medication Sig  . albuterol (PROVENTIL HFA;VENTOLIN HFA) 108 (90 Base) MCG/ACT inhaler Inhale 2 puffs into the lungs every 6 (six) hours as needed for wheezing.  . ergocalciferol (DRISDOL) 1.25 MG (50000 UT) capsule Take 1 capsule (50,000 Units total) by mouth once a week.  Marland Kitchen ketorolac (TORADOL) 10 MG tablet Take 1 tablet (10 mg total) by mouth every 8 (eight) hours as needed for moderate pain.  . Multiple Vitamins-Minerals (MULTIVITAMIN ADULT PO) Take 1 tablet by mouth daily.  . [DISCONTINUED] fluconazole (DIFLUCAN) 150 MG tablet Take 1 tablet (150 mg total) by mouth every three (3) days as needed.  . [DISCONTINUED] Phendimetrazine Tartrate 105 MG CP24 Take 1 capsule (105 mg total) by mouth daily.  . drospirenone-ethinyl estradiol (YAZ) 3-0.02 MG tablet Take 1 tablet by mouth daily.   No facility-administered encounter medications on file as of 09/06/2019.    Surgical History: Past Surgical History:  Procedure Laterality Date  . BREAST  CYST EXCISION Left 2014  . CESAREAN SECTION  01/11/11; 09/27/2014  . CESAREAN SECTION N/A 04/16/2012   Procedure: Cesarean Section;  Surgeon: Guss Bunde, MD;  Location: [facility];  Service: Obstetrics;  Laterality: N/A;  Wound Class (Clean contaminated)  . INSERTION OF MESH N/A 12/01/2014   Procedure: INSERTION OF MESH;  Surgeon: Coralie Keens, MD;  Location: [facility];  Service: General;  Laterality: N/A;  . UMBILICAL HERNIA REPAIR N/A 12/01/2014   Procedure: UMBILICAL HERNIA REPAIR ;  Surgeon: Coralie Keens, MD;  Location: [facility];  Service: General;  Laterality: N/A;  . WISDOM TOOTH EXTRACTION      Medical History: Past Medical History:  Diagnosis Date  . Asthma    prn inhaler  . Migraine   . Umbilical hernia 06/3327    Family History: Family History  Problem Relation Age of Onset  . Diabetes Maternal Aunt        Type 2  . Heart disease Maternal Grandmother   . Cancer Maternal Grandfather        liver cancer  . Heart disease Maternal Grandfather   . Heart disease Paternal Grandmother   . Heart disease Paternal Grandfather   . Cleft palate Son        deceased  . Breast cancer Neg Hx     Social History   Socioeconomic History  . Marital status: Married    Spouse name: Not on file  . Number of children: Not on file  . Years of education: Not on file  .  Highest education level: Not on file  Occupational History  . Not on file  Tobacco Use  . Smoking status: Former Smoker    Packs/day: 0.00    Quit date: 12/19/2008    Years since quitting: 10.7  . Smokeless tobacco: Never Used  Vaping Use  . Vaping Use: Never used  Substance and Sexual Activity  . Alcohol use: Yes    Alcohol/week: 3.0 standard drinks    Types: 3 Glasses of wine per week    Comment: occasionally  . Drug use: No  . Sexual activity: Yes    Partners: Male    Birth control/protection: OCP, Pill  Other Topics Concern  . Not on file  Social History Narrative   . Not on file   Social Determinants of Health   Financial Resource Strain:   . Difficulty of Paying Living Expenses:   Food Insecurity:   . Worried About Charity fundraiser in the Last Year:   . Arboriculturist in the Last Year:   Transportation Needs:   . Film/video editor (Medical):   Marland Kitchen Lack of Transportation (Non-Medical):   Physical Activity:   . Days of Exercise per Week:   . Minutes of Exercise per Session:   Stress:   . Feeling of Stress :   Social Connections:   . Frequency of Communication with Friends and Family:   . Frequency of Social Gatherings with Friends and Family:   . Attends Religious Services:   . Active Member of Clubs or Organizations:   . Attends Archivist Meetings:   Marland Kitchen Marital Status:   Intimate Partner Violence:   . Fear of Current or Ex-Partner:   . Emotionally Abused:   Marland Kitchen Physically Abused:   . Sexually Abused:       Review of Systems  Constitutional: Negative for activity change, chills, fatigue and unexpected weight change.  HENT: Negative for congestion, postnasal drip, rhinorrhea, sneezing and sore throat.   Respiratory: Negative for cough, chest tightness and shortness of breath.   Cardiovascular: Negative for chest pain and palpitations.  Gastrointestinal: Negative for abdominal pain, constipation, diarrhea, nausea and vomiting.  Genitourinary: Positive for menstrual problem. Negative for dysuria and frequency.       The patient states that she is having heavier menstrual cycles which are coming closer together. She is getting more severe cramping with her menstrual periods.   Musculoskeletal: Negative for arthralgias, back pain, joint swelling and neck pain.  Skin: Negative for rash.  Allergic/Immunologic: Negative for environmental allergies.  Neurological: Positive for headaches. Negative for tremors and numbness.       History of migraine headaches.  Hematological: Negative for adenopathy. Does not bruise/bleed  easily.  Psychiatric/Behavioral: Negative for behavioral problems (Depression), sleep disturbance and suicidal ideas. The patient is not nervous/anxious.     Today's Vitals   09/06/19 0945  BP: 123/74  Pulse: 81  Resp: 16  Temp: (!) 97.5 F (36.4 C)  SpO2: 99%  Weight: 175 lb 6.4 oz (79.6 kg)  Height: 5\' 3"  (1.6 m)   Body mass index is 31.07 kg/m.  Physical Exam Vitals and nursing note reviewed.  Constitutional:      General: She is not in acute distress.    Appearance: Normal appearance. She is well-developed. She is not diaphoretic.  HENT:     Head: Normocephalic and atraumatic.     Mouth/Throat:     Pharynx: No oropharyngeal exudate.  Eyes:     Pupils: Pupils  are equal, round, and reactive to light.  Neck:     Thyroid: No thyromegaly.     Vascular: No JVD.     Trachea: No tracheal deviation.  Cardiovascular:     Rate and Rhythm: Normal rate and regular rhythm.     Heart sounds: Normal heart sounds. No murmur heard.  No friction rub. No gallop.   Pulmonary:     Effort: Pulmonary effort is normal. No respiratory distress.     Breath sounds: Normal breath sounds. No wheezing or rales.  Chest:     Chest wall: No tenderness.  Abdominal:     General: Bowel sounds are normal.     Palpations: Abdomen is soft.  Musculoskeletal:        General: Normal range of motion.     Cervical back: Normal range of motion and neck supple.  Lymphadenopathy:     Cervical: No cervical adenopathy.  Skin:    General: Skin is warm and dry.  Neurological:     Mental Status: She is alert and oriented to person, place, and time.     Cranial Nerves: No cranial nerve deficit.  Psychiatric:        Mood and Affect: Mood normal.        Behavior: Behavior normal.        Thought Content: Thought content normal.        Judgment: Judgment normal.    Assessment/Plan: 1. Menorrhagia with regular cycle Patient reporting heavy menstrual cycles with severe cramping. Will do trial yaz to help  regulate and improve menstrual cycles.   2. OCP (oral contraceptive pills) initiation Patient reporting heavy menstrual cycles with severe cramping. Will do trial yaz to help regulate and improve menstrual cycles.  Advised her to start this 24 to 48 hours after starting her next menstrual cycle.  - drospirenone-ethinyl estradiol (YAZ) 3-0.02 MG tablet; Take 1 tablet by mouth daily.  Dispense: 30 tablet; Refill: 11  3. Migraine with aura and without status migrainosus, not intractable Advised patient that OCP can make migraine headaches more frequent and severe. If this occurs she should stop the new OCP and contact office to try something different.  She voiced understanding and agreement with these instructions.   General Counseling: Cindy Walker understanding of the findings of todays visit and agrees with plan of treatment. I have discussed any further diagnostic evaluation that may be needed or ordered today. We also reviewed her medications today. she has been encouraged to call the office with any questions or concerns that should arise related to todays visit.   This patient was seen by [place] with Dr Lavera Guise as a part of collaborative care agreement  Meds ordered this encounter  Medications  . drospirenone-ethinyl estradiol (YAZ) 3-0.02 MG tablet    Sig: Take 1 tablet by mouth daily.    Dispense:  30 tablet    Refill:  11    Order Specific Question:   Supervising Provider    Answer:   Lavera Guise [[id number]]    Total time spent: 25 Minutes   Time spent includes review of chart, medications, test results, and follow up plan with the patient.      Dr Lavera Guise Internal medicine

## 2019-10-14 ENCOUNTER — Ambulatory Visit (INDEPENDENT_AMBULATORY_CARE_PROVIDER_SITE_OTHER): Payer: BLUE CROSS/BLUE SHIELD | Admitting: *Deleted

## 2019-10-14 ENCOUNTER — Other Ambulatory Visit: Payer: Self-pay

## 2019-10-14 VITALS — BP 139/89 | HR 66

## 2019-10-14 DIAGNOSIS — Z3201 Encounter for pregnancy test, result positive: Secondary | ICD-10-CM

## 2019-10-14 LAB — POCT URINE PREGNANCY: Preg Test, Ur: POSITIVE — AB

## 2019-10-14 NOTE — Progress Notes (Signed)
Patient was assessed and managed by nursing staff during this encounter. I have reviewed the chart and agree with the documentation and plan. I have also made any necessary editorial changes.  Verita Schneiders, MD 10/14/2019 1:50 PM

## 2019-10-14 NOTE — Progress Notes (Signed)
Pt here today for UPT. Had a positive test at home. Pt's LMP was 08/30/2019. Pt had also started new OCP's on 7/5 .   UPT in office was positive  Advised to pt not continue taking OCP's, and to stat taking a PNV. Pt also takes meds for migraines as needed. Informed that she can take Tylenol for HA's and she should let her prescribing doctor know of her pregnancy.   Pt to make new OB appt in about 5 weeks, pt is 6 weeks 3 days according to her LMP.

## 2019-11-11 ENCOUNTER — Ambulatory Visit: Payer: BLUE CROSS/BLUE SHIELD | Admitting: Nurse Practitioner

## 2019-11-11 ENCOUNTER — Other Ambulatory Visit: Payer: Self-pay

## 2019-11-11 ENCOUNTER — Encounter: Payer: Self-pay | Admitting: Nurse Practitioner

## 2019-11-11 VITALS — BP 118/72 | HR 87 | Temp 98.0°F | Resp 16 | Ht 63.0 in | Wt 185.2 lb

## 2019-11-11 DIAGNOSIS — G43109 Migraine with aura, not intractable, without status migrainosus: Secondary | ICD-10-CM | POA: Diagnosis not present

## 2019-11-11 DIAGNOSIS — Z3201 Encounter for pregnancy test, result positive: Secondary | ICD-10-CM | POA: Insufficient documentation

## 2019-11-11 MED ORDER — BUTALBITAL-APAP-CAFFEINE 50-325-40 MG PO TABS: 1.0000 | ORAL_TABLET | Freq: Four times a day (QID) | ORAL | 1 refills | Status: DC | PRN

## 2019-11-11 NOTE — Progress Notes (Signed)
[facility] [place], [place] [address]  Internal MEDICINE  Office Visit Note  Patient Name: Cindy Walker  [id number]  [id number]  Date of Service: 11/11/2019  Chief Complaint  Patient presents with  . Follow-up    migrian meds safe to use durring pregnancy    The patient is here for routine follow up. She found out at the end of July, she was pregnant. This was surprise for her as she had just started ocp. Possible she was pregnant prior to starting birth control. She is approximately [redacted] weeks pregnant at this time. Her projected due date is in early March, 2022. She states that she does get migraine headaches and needs to take something, when she gets acute migraine, which is safe to take while pregnant. She states that she gets one migraine every six weeks or so. With past pregnancies, she did take [place]. We reviewed safety information for this medication and determined that this would be ok for her to take.       Current Medication: Outpatient Encounter Medications as of 11/11/2019  Medication Sig  . albuterol (PROVENTIL HFA;VENTOLIN HFA) 108 (90 Base) MCG/ACT inhaler Inhale 2 puffs into the lungs every 6 (six) hours as needed for wheezing.  . ergocalciferol (DRISDOL) 1.25 MG (50000 UT) capsule Take 1 capsule (50,000 Units total) by mouth once a week.  . Multiple Vitamins-Minerals (MULTIVITAMIN ADULT PO) Take 1 tablet by mouth daily.  . butalbital-acetaminophen-caffeine (FIORICET) 50-325-40 MG tablet Take 1 tablet by mouth every 6 (six) hours as needed for headache.  . drospirenone-ethinyl estradiol (YAZ) 3-0.02 MG tablet Take 1 tablet by mouth daily. (Patient not taking: Reported on 11/11/2019)  . ketorolac (TORADOL) 10 MG tablet Take 1 tablet (10 mg total) by mouth every 8 (eight) hours as needed for moderate pain. (Patient not taking: Reported on 11/11/2019)   No facility-administered encounter medications on file as of 11/11/2019.    Surgical  History: Past Surgical History:  Procedure Laterality Date  . BREAST CYST EXCISION Left 2014  . CESAREAN SECTION  01/11/11; 09/27/2014  . CESAREAN SECTION N/A 04/16/2012   Procedure: Cesarean Section;  Surgeon: Guss Bunde, MD;  Location: [facility];  Service: Obstetrics;  Laterality: N/A;  Wound Class (Clean contaminated)  . INSERTION OF MESH N/A 12/01/2014   Procedure: INSERTION OF MESH;  Surgeon: Coralie Keens, MD;  Location: [facility];  Service: General;  Laterality: N/A;  . UMBILICAL HERNIA REPAIR N/A 12/01/2014   Procedure: UMBILICAL HERNIA REPAIR ;  Surgeon: Coralie Keens, MD;  Location: [facility];  Service: General;  Laterality: N/A;  . WISDOM TOOTH EXTRACTION      Medical History: Past Medical History:  Diagnosis Date  . Asthma    prn inhaler  . Migraine   . Umbilical hernia 04/4399    Family History: Family History  Problem Relation Age of Onset  . Diabetes Maternal Aunt        Type 2  . Heart disease Maternal Grandmother   . Cancer Maternal Grandfather        liver cancer  . Heart disease Maternal Grandfather   . Heart disease Paternal Grandmother   . Heart disease Paternal Grandfather   . Cleft palate Son        deceased  . Breast cancer Neg Hx     Social History   Socioeconomic History  . Marital status: Married    Spouse name: Not on file  . Number of  children: Not on file  . Years of education: Not on file  . Highest education level: Not on file  Occupational History  . Not on file  Tobacco Use  . Smoking status: Former Smoker    Packs/day: 0.00    Quit date: 12/19/2008    Years since quitting: 10.9  . Smokeless tobacco: Never Used  Vaping Use  . Vaping Use: Never used  Substance and Sexual Activity  . Alcohol use: Not Currently    Alcohol/week: 3.0 standard drinks    Types: 3 Glasses of wine per week    Comment: occasionally  . Drug use: No  . Sexual activity: Yes    Partners: Male    Birth  control/protection: OCP, Pill  Other Topics Concern  . Not on file  Social History Narrative  . Not on file   Social Determinants of Health   Financial Resource Strain:   . Difficulty of Paying Living Expenses: Not on file  Food Insecurity:   . Worried About Charity fundraiser in the Last Year: Not on file  . Ran Out of Food in the Last Year: Not on file  Transportation Needs:   . Lack of Transportation (Medical): Not on file  . Lack of Transportation (Non-Medical): Not on file  Physical Activity:   . Days of Exercise per Week: Not on file  . Minutes of Exercise per Session: Not on file  Stress:   . Feeling of Stress : Not on file  Social Connections:   . Frequency of Communication with Friends and Family: Not on file  . Frequency of Social Gatherings with Friends and Family: Not on file  . Attends Religious Services: Not on file  . Active Member of Clubs or Organizations: Not on file  . Attends Archivist Meetings: Not on file  . Marital Status: Not on file  Intimate Partner Violence:   . Fear of Current or Ex-Partner: Not on file  . Emotionally Abused: Not on file  . Physically Abused: Not on file  . Sexually Abused: Not on file      Review of Systems  Constitutional: Negative for activity change, chills, fatigue and unexpected weight change.  HENT: Negative for congestion, postnasal drip, rhinorrhea, sneezing and sore throat.   Respiratory: Negative for cough, chest tightness, shortness of breath and wheezing.   Cardiovascular: Negative for chest pain and palpitations.  Gastrointestinal: Negative for abdominal pain, constipation, diarrhea, nausea and vomiting.  Endocrine: Negative for cold intolerance, heat intolerance, polydipsia and polyuria.  Musculoskeletal: Negative for arthralgias, back pain, joint swelling and neck pain.  Skin: Negative for rash.  Neurological: Positive for headaches. Negative for dizziness, tremors and numbness.  Hematological:  Negative for adenopathy. Does not bruise/bleed easily.  Psychiatric/Behavioral: Negative for behavioral problems (Depression), sleep disturbance and suicidal ideas. The patient is not nervous/anxious.     Today's Vitals   11/11/19 0850  BP: 118/72  Pulse: 87  Resp: 16  Temp: 98 F (36.7 C)  SpO2: 100%  Weight: 185 lb 3.2 oz (84 kg)  Height: 5\' 3"  (1.6 m)   Body mass index is 32.81 kg/m.  Physical Exam Vitals and nursing note reviewed.  Constitutional:      General: She is not in acute distress.    Appearance: Normal appearance. She is well-developed. She is not diaphoretic.  HENT:     Head: Normocephalic and atraumatic.     Mouth/Throat:     Pharynx: No oropharyngeal exudate.  Eyes:  Pupils: Pupils are equal, round, and reactive to light.  Neck:     Thyroid: No thyromegaly.     Vascular: No JVD.     Trachea: No tracheal deviation.  Cardiovascular:     Rate and Rhythm: Normal rate and regular rhythm.     Heart sounds: Normal heart sounds. No murmur heard.  No friction rub. No gallop.   Pulmonary:     Effort: Pulmonary effort is normal. No respiratory distress.     Breath sounds: Normal breath sounds. No wheezing or rales.  Chest:     Chest wall: No tenderness.  Abdominal:     Palpations: Abdomen is soft.  Musculoskeletal:        General: Normal range of motion.     Cervical back: Normal range of motion and neck supple.  Lymphadenopathy:     Cervical: No cervical adenopathy.  Skin:    General: Skin is warm and dry.  Neurological:     Mental Status: She is alert and oriented to person, place, and time.     Cranial Nerves: No cranial nerve deficit.  Psychiatric:        Mood and Affect: Mood normal.        Behavior: Behavior normal.        Thought Content: Thought content normal.        Judgment: Judgment normal.    Assessment/Plan: 1. Migraine with aura and without status migrainosus, not intractable New prescription for fioricet - may take as needed and  as prescribed for acute migraine headaches. Reviewed risk factors associated with taking this medication during pregnancy. Biggest concern is for persistent treatment during last trimester of pregnancy and neonatal withdrawal. Since she has migraines so infrequently, the risk is very small. Will monitor.  - butalbital-acetaminophen-caffeine (FIORICET) 50-325-40 MG tablet; Take 1 tablet by mouth every 6 (six) hours as needed for headache.  Dispense: 45 tablet; Refill: 1  2. Positive pregnancy test Reviewed risk factors associated with taking this medication during pregnancy. There is limited human data available. Biggest concern is for persistent treatment during last trimester of pregnancy and neonatal withdrawal. Since she has migraines so infrequently, the risk is very small. Will monitor.   General Counseling: christee mervine understanding of the findings of todays visit and agrees with plan of treatment. I have discussed any further diagnostic evaluation that may be needed or ordered today. We also reviewed her medications today. she has been encouraged to call the office with any questions or concerns that should arise related to todays visit.   This patient was seen by [place] with Dr Lavera Guise as a part of collaborative care agreement  Meds ordered this encounter  Medications  . butalbital-acetaminophen-caffeine (FIORICET) 50-325-40 MG tablet    Sig: Take 1 tablet by mouth every 6 (six) hours as needed for headache.    Dispense:  45 tablet    Refill:  1    Order Specific Question:   Supervising Provider    Answer:   Lavera Guise [[id number]]    Total time spent: 25 Minutes   Time spent includes review of chart, medications, test results, and follow up plan with the patient.      Dr Lavera Guise Internal medicine

## 2019-11-19 ENCOUNTER — Ambulatory Visit (INDEPENDENT_AMBULATORY_CARE_PROVIDER_SITE_OTHER): Payer: BLUE CROSS/BLUE SHIELD | Admitting: Obstetrics and Gynecology

## 2019-11-19 ENCOUNTER — Encounter: Payer: Self-pay | Admitting: Obstetrics and Gynecology

## 2019-11-19 ENCOUNTER — Other Ambulatory Visit: Payer: Self-pay

## 2019-11-19 ENCOUNTER — Encounter: Payer: BLUE CROSS/BLUE SHIELD | Admitting: Obstetrics and Gynecology

## 2019-11-19 ENCOUNTER — Inpatient Hospital Stay (HOSPITAL_COMMUNITY): Admit: 2019-11-19 | Payer: BLUE CROSS/BLUE SHIELD

## 2019-11-19 VITALS — BP 134/81 | HR 84 | Wt 184.0 lb

## 2019-11-19 DIAGNOSIS — O09521 Supervision of elderly multigravida, first trimester: Secondary | ICD-10-CM | POA: Diagnosis not present

## 2019-11-19 DIAGNOSIS — Z3481 Encounter for supervision of other normal pregnancy, first trimester: Secondary | ICD-10-CM

## 2019-11-19 DIAGNOSIS — Z3480 Encounter for supervision of other normal pregnancy, unspecified trimester: Secondary | ICD-10-CM

## 2019-11-19 DIAGNOSIS — Z8279 Family history of other congenital malformations, deformations and chromosomal abnormalities: Secondary | ICD-10-CM | POA: Diagnosis not present

## 2019-11-19 DIAGNOSIS — Z3A11 11 weeks gestation of pregnancy: Secondary | ICD-10-CM

## 2019-11-19 DIAGNOSIS — O99211 Obesity complicating pregnancy, first trimester: Secondary | ICD-10-CM

## 2019-11-19 DIAGNOSIS — O34219 Maternal care for unspecified type scar from previous cesarean delivery: Secondary | ICD-10-CM | POA: Diagnosis not present

## 2019-11-19 DIAGNOSIS — O09529 Supervision of elderly multigravida, unspecified trimester: Secondary | ICD-10-CM | POA: Insufficient documentation

## 2019-11-19 DIAGNOSIS — O99210 Obesity complicating pregnancy, unspecified trimester: Secondary | ICD-10-CM | POA: Insufficient documentation

## 2019-11-19 MED ORDER — ASPIRIN EC 81 MG PO TBEC: 81.0000 mg | DELAYED_RELEASE_TABLET | Freq: Every day | ORAL | 2 refills | Status: DC

## 2019-11-19 MED ORDER — PROMETHAZINE HCL 25 MG PO TABS: 25.0000 mg | ORAL_TABLET | Freq: Four times a day (QID) | ORAL | 2 refills | Status: DC | PRN

## 2019-11-19 MED ORDER — DOXYLAMINE-PYRIDOXINE 10-10 MG PO TBEC: 2.0000 | DELAYED_RELEASE_TABLET | Freq: Every day | ORAL | 5 refills | Status: DC

## 2019-11-19 NOTE — Progress Notes (Signed)
DATING AND VIABILITY SONOGRAM   Cindy Walker is a 34 y.o. year old G62P2102 with LMP Patient's last menstrual period was 08/30/2019. which would correlate to  [redacted]w[redacted]d weeks gestation.  She has regular menstrual cycles.   She is here today for a confirmatory initial sonogram.    GESTATION: SINGLETON yes     FETAL ACTIVITY:          Heart rate    158          The fetus is active.   GESTATIONAL AGE AND  BIOMETRICS:  Gestational criteria: Estimated Date of Delivery: 06/05/20 by LMP now at [redacted]w[redacted]d  Previous Scans:0  GESTATIONAL SAC          mm          weeks  CROWN RUMP LENGTH           5.01 mm         11.5 weeks                                                   AVERAGE EGA(BY THIS SCAN):  11.5 weeks  WORKING EDD( LMP ):  06/05/2020     TECHNICIAN COMMENTS:  Patient informed that the ultrasound is considered a limited obstetric ultrasound and is not intended to be a complete ultrasound exam. Patient also informed that the ultrasound is not being completed with the intent of assessing for fetal or placental anomalies or any pelvic abnormalities. Explained that the purpose of today's ultrasound is to assess for fetal heart rate. Patient acknowledges the purpose of the exam and the limitations of the study.       A copy of this report including all images has been saved and backed up to a second source for retrieval if needed. All measures and details of the anatomical scan, placentation, fluid volume and pelvic anatomy are contained in that report.  Crosby Oyster 11/19/2019 9:17 AM

## 2019-11-19 NOTE — Progress Notes (Signed)
Subjective:    Cindy Walker is a [id number] [redacted]w[redacted]d being seen today for her first obstetrical visit.  Her obstetrical history is significant for advanced maternal age, obesity and previous cesarean section x 3. Patient with h/o 36 week delivery due to placental abruption. Patient does intend to breast feed. Pregnancy history fully reviewed.  Patient reports nausea.  Vitals:   11/19/19 0901  BP: 134/81  Pulse: 84  Weight: 184 lb (83.5 kg)    HISTORY: OB History  Gravida Para Term Preterm AB Living  4 3 2 1   2   SAB TAB Ectopic Multiple Live Births          3    # Outcome Date GA Lbr Len/2nd Weight Sex Delivery Anes PTL Lv  4 Current           3 Preterm 09/27/14 [redacted]w[redacted]d   F CS-LTranv  Y LIV     Complications: Abruptio Placenta  2 Term 04/16/12 [redacted]w[redacted]d   F CS-LTranv Spinal  LIV  1 Term 12/2010   7 lb (3.175 kg) M CS-LTranv  N DEC     Birth Comments: Cleft Lip and palate/died due to surgical complications     Obstetric Comments  Son passed in 2/13-delivered c-section   Past Medical History:  Diagnosis Date  . Asthma    prn inhaler  . Migraine   . Umbilical hernia 09/2618   Past Surgical History:  Procedure Laterality Date  . BREAST CYST EXCISION Left 2014  . CESAREAN SECTION  01/11/11; 09/27/2014  . CESAREAN SECTION N/A 04/16/2012   Procedure: Cesarean Section;  Surgeon: Guss Bunde, MD;  Location: [facility];  Service: Obstetrics;  Laterality: N/A;  Wound Class (Clean contaminated)  . INSERTION OF MESH N/A 12/01/2014   Procedure: INSERTION OF MESH;  Surgeon: Coralie Keens, MD;  Location: [facility];  Service: General;  Laterality: N/A;  . UMBILICAL HERNIA REPAIR N/A 12/01/2014   Procedure: UMBILICAL HERNIA REPAIR ;  Surgeon: Coralie Keens, MD;  Location: [facility];  Service: General;  Laterality: N/A;  . WISDOM TOOTH EXTRACTION     Family History  Problem Relation Age of Onset  . Diabetes Maternal Aunt        Type 2  . Heart  disease Maternal Grandmother   . Cancer Maternal Grandfather        liver cancer  . Heart disease Maternal Grandfather   . Heart disease Paternal Grandmother   . Heart disease Paternal Grandfather   . Cleft palate Son        deceased  . Breast cancer Neg Hx      Exam    Uterus:     Pelvic Exam:    Perineum: Normal Perineum   Vulva: normal   Vagina:  normal mucosa, normal discharge   pH:    Cervix: multiparous appearance and cervix is closed and long   Adnexa: not evaluated   Bony Pelvis: gynecoid  System: Breast:  normal appearance, no masses or tenderness   Skin: normal coloration and turgor, no rashes    Neurologic: oriented, no focal deficits   Extremities: normal strength, tone, and muscle mass   HEENT extra ocular movement intact   Mouth/Teeth mucous membranes moist, pharynx normal without lesions and dental hygiene good   Neck supple and no masses   Cardiovascular: regular rate and rhythm   Respiratory:  appears well, vitals normal, no respiratory distress, acyanotic, normal RR, chest clear, no wheezing, crepitations, rhonchi,  normal symmetric air entry   Abdomen: soft, non-tender; bowel sounds normal; no masses,  no organomegaly   Urinary:       Assessment:    Pregnancy: [id number] Patient Active Problem List   Diagnosis Date Noted  . Positive pregnancy test 11/11/2019  . Menorrhagia with regular cycle 09/06/2019  . OCP (oral contraceptive pills) initiation 09/06/2019  . Breast lump on right side at 6 o'clock position 05/27/2019  . Body mass index 28.0-28.9, adult 12/30/2018  . Other fatigue 08/01/2018  . Vitamin D deficiency 06/21/2018  . BMI 30.0-30.9,adult 06/21/2018  . Morbid obesity ([place]) 06/21/2018  . Abnormal weight gain 06/09/2018  . Dysuria 06/09/2018  . Acute sore throat 03/04/2018  . Oral candidiasis 03/04/2018  . Mild intermittent asthma without complication 15/37/9432  . Encounter for general adult medical examination with abnormal findings  05/06/2016  . Umbilical hernia 76/14/7092  . Previous cesarean delivery affecting pregnancy 03/11/2014  . Supervision of other normal pregnancy, antepartum 02/26/2014  . Left breast mass 06/04/2012  . Breast cyst 05/30/2012  . Family history of cleft palate with cleft lip 03/22/2012  . Asthma 02/24/2012  . Migraine 02/24/2012        Plan:     Initial labs drawn. Prenatal vitamins. Problem list reviewed and updated. Genetic Screening discussed : Panorama ordered.  Ultrasound discussed; fetal survey: ordered. Rx ASA provided Rx diclegis and phenergan provided Patient with first child born with cleft palate- taking supplemental folic acid  Follow up in 4 weeks. 50% of 30 min visit spent on counseling and coordination of care.     Martrice Apt 11/19/2019

## 2019-11-19 NOTE — Patient Instructions (Signed)
First Trimester of Pregnancy The first trimester of pregnancy is from week 1 until the end of week 13 (months 1 through 3). A week after a sperm fertilizes an egg, the egg will implant on the wall of the uterus. This embryo will begin to develop into a baby. Genes from you and your partner will form the baby. The female genes will determine whether the baby will be a boy or a girl. At 6-8 weeks, the eyes and face will be formed, and the heartbeat can be seen on ultrasound. At the end of 12 weeks, all the baby's organs will be formed. Now that you are pregnant, you will want to do everything you can to have a healthy baby. Two of the most important things are to get good prenatal care and to follow your health care provider's instructions. Prenatal care is all the medical care you receive before the baby's birth. This care will help prevent, find, and treat any problems during the pregnancy and childbirth. Body changes during your first trimester Your body goes through many changes during pregnancy. The changes vary from woman to woman.  You may gain or lose a couple of pounds at first.  You may feel sick to your stomach (nauseous) and you may throw up (vomit). If the vomiting is uncontrollable, call your health care provider.  You may tire easily.  You may develop headaches that can be relieved by medicines. All medicines should be approved by your health care provider.  You may urinate more often. Painful urination may mean you have a bladder infection.  You may develop heartburn as a result of your pregnancy.  You may develop constipation because certain hormones are causing the muscles that push stool through your intestines to slow down.  You may develop hemorrhoids or swollen veins (varicose veins).  Your breasts may begin to grow larger and become tender. Your nipples may stick out more, and the tissue that surrounds them (areola) may become darker.  Your gums may bleed and may be  sensitive to brushing and flossing.  Dark spots or blotches (chloasma, mask of pregnancy) may develop on your face. This will likely fade after the baby is born.  Your menstrual periods will stop.  You may have a loss of appetite.  You may develop cravings for certain kinds of food.  You may have changes in your emotions from day to day, such as being excited to be pregnant or being concerned that something may go wrong with the pregnancy and baby.  You may have more vivid and strange dreams.  You may have changes in your hair. These can include thickening of your hair, rapid growth, and changes in texture. Some women also have hair loss during or after pregnancy, or hair that feels dry or thin. Your hair will most likely return to normal after your baby is born. What to expect at prenatal visits During a routine prenatal visit:  You will be weighed to make sure you and the baby are growing normally.  Your blood pressure will be taken.  Your abdomen will be measured to track your baby's growth.  The fetal heartbeat will be listened to between weeks 10 and 14 of your pregnancy.  Test results from any previous visits will be discussed. Your health care provider may ask you:  How you are feeling.  If you are feeling the baby move.  If you have had any abnormal symptoms, such as leaking fluid, bleeding, severe headaches, or  abdominal cramping.  If you are using any tobacco products, including cigarettes, chewing tobacco, and electronic cigarettes.  If you have any questions. Other tests that may be performed during your first trimester include:  Blood tests to find your blood type and to check for the presence of any previous infections. The tests will also be used to check for low iron levels (anemia) and protein on red blood cells (Rh antibodies). Depending on your risk factors, or if you previously had diabetes during pregnancy, you may have tests to check for high blood sugar  that affects pregnant women (gestational diabetes).  Urine tests to check for infections, diabetes, or protein in the urine.  An ultrasound to confirm the proper growth and development of the baby.  Fetal screens for spinal cord problems (spina bifida) and Down syndrome.  HIV (human immunodeficiency virus) testing. Routine prenatal testing includes screening for HIV, unless you choose not to have this test.  You may need other tests to make sure you and the baby are doing well. Follow these instructions at home: Medicines  Follow your health care provider's instructions regarding medicine use. Specific medicines may be either safe or unsafe to take during pregnancy.  Take a prenatal vitamin that contains at least 600 micrograms (mcg) of folic acid.  If you develop constipation, try taking a stool softener if your health care provider approves. Eating and drinking   Eat a balanced diet that includes fresh fruits and vegetables, whole grains, good sources of protein such as meat, eggs, or tofu, and low-fat dairy. Your health care provider will help you determine the amount of weight gain that is right for you.  Avoid raw meat and uncooked cheese. These carry germs that can cause birth defects in the baby.  Eating four or five small meals rather than three large meals a day may help relieve nausea and vomiting. If you start to feel nauseous, eating a few soda crackers can be helpful. Drinking liquids between meals, instead of during meals, also seems to help ease nausea and vomiting.  Limit foods that are high in fat and processed sugars, such as fried and sweet foods.  To prevent constipation: ? Eat foods that are high in fiber, such as fresh fruits and vegetables, whole grains, and beans. ? Drink enough fluid to keep your urine clear or pale yellow. Activity  Exercise only as directed by your health care provider. Most women can continue their usual exercise routine during  pregnancy. Try to exercise for 30 minutes at least 5 days a week. Exercising will help you: ? Control your weight. ? Stay in shape. ? Be prepared for labor and delivery.  Experiencing pain or cramping in the lower abdomen or lower back is a good sign that you should stop exercising. Check with your health care provider before continuing with normal exercises.  Try to avoid standing for long periods of time. Move your legs often if you must stand in one place for a long time.  Avoid heavy lifting.  Wear low-heeled shoes and practice good posture.  You may continue to have sex unless your health care provider tells you not to. Relieving pain and discomfort  Wear a good support bra to relieve breast tenderness.  Take warm sitz baths to soothe any pain or discomfort caused by hemorrhoids. Use hemorrhoid cream if your health care provider approves.  Rest with your legs elevated if you have leg cramps or low back pain.  If you develop varicose veins   in your legs, wear support hose. Elevate your feet for 15 minutes, 3-4 times a day. Limit salt in your diet. Prenatal care  Schedule your prenatal visits by the twelfth week of pregnancy. They are usually scheduled monthly at first, then more often in the last 2 months before delivery.  Write down your questions. Take them to your prenatal visits.  Keep all your prenatal visits as told by your health care provider. This is important. Safety  Wear your seat belt at all times when driving.  Make a list of emergency phone numbers, including numbers for family, friends, the hospital, and police and fire departments. General instructions  Ask your health care provider for a referral to a local prenatal education class. Begin classes no later than the beginning of month 6 of your pregnancy.  Ask for help if you have counseling or nutritional needs during pregnancy. Your health care provider can offer advice or refer you to specialists for help  with various needs.  Do not use hot tubs, steam rooms, or saunas.  Do not douche or use tampons or scented sanitary pads.  Do not cross your legs for long periods of time.  Avoid cat litter boxes and soil used by cats. These carry germs that can cause birth defects in the baby and possibly loss of the fetus by miscarriage or stillbirth.  Avoid all smoking, herbs, alcohol, and medicines not prescribed by your health care provider. Chemicals in these products affect the formation and growth of the baby.  Do not use any products that contain nicotine or tobacco, such as cigarettes and e-cigarettes. If you need help quitting, ask your health care provider. You may receive counseling support and other resources to help you quit.  Schedule a dentist appointment. At home, brush your teeth with a soft toothbrush and be gentle when you floss. Contact a health care provider if:  You have dizziness.  You have mild pelvic cramps, pelvic pressure, or nagging pain in the abdominal area.  You have persistent nausea, vomiting, or diarrhea.  You have a bad smelling vaginal discharge.  You have pain when you urinate.  You notice increased swelling in your face, hands, legs, or ankles.  You are exposed to fifth disease or chickenpox.  You are exposed to [place] measles (rubella) and have never had it. Get help right away if:  You have a fever.  You are leaking fluid from your vagina.  You have spotting or bleeding from your vagina.  You have severe abdominal cramping or pain.  You have rapid weight gain or loss.  You vomit blood or material that looks like coffee grounds.  You develop a severe headache.  You have shortness of breath.  You have any kind of trauma, such as from a fall or a car accident. Summary  The first trimester of pregnancy is from week 1 until the end of week 13 (months 1 through 3).  Your body goes through many changes during pregnancy. The changes vary from  woman to woman.  You will have routine prenatal visits. During those visits, your health care provider will examine you, discuss any test results you may have, and talk with you about how you are feeling. This information is not intended to replace advice given to you by your health care provider. Make sure you discuss any questions you have with your health care provider. Document Revised: 02/17/2017 Document Reviewed: 02/17/2016 Elsevier Patient Education  [place] of  Pregnancy The second trimester is from week 14 through week 27 (months 4 through 6). The second trimester is often a time when you feel your best. Your body has adjusted to being pregnant, and you begin to feel better physically. Usually, morning sickness has lessened or quit completely, you may have more energy, and you may have an increase in appetite. The second trimester is also a time when the fetus is growing rapidly. At the end of the sixth month, the fetus is about 9 inches long and weighs about 1 pounds. You will likely begin to feel the baby move (quickening) between 16 and 20 weeks of pregnancy. Body changes during your second trimester Your body continues to go through many changes during your second trimester. The changes vary from woman to woman.  Your weight will continue to increase. You will notice your lower abdomen bulging out.  You may begin to get stretch marks on your hips, abdomen, and breasts.  You may develop headaches that can be relieved by medicines. The medicines should be approved by your health care provider.  You may urinate more often because the fetus is pressing on your bladder.  You may develop or continue to have heartburn as a result of your pregnancy.  You may develop constipation because certain hormones are causing the muscles that push waste through your intestines to slow down.  You may develop hemorrhoids or swollen, bulging veins (varicose  veins).  You may have back pain. This is caused by: ? Weight gain. ? Pregnancy hormones that are relaxing the joints in your pelvis. ? A shift in weight and the muscles that support your balance.  Your breasts will continue to grow and they will continue to become tender.  Your gums may bleed and may be sensitive to brushing and flossing.  Dark spots or blotches (chloasma, mask of pregnancy) may develop on your face. This will likely fade after the baby is born.  A dark line from your belly button to the pubic area (linea nigra) may appear. This will likely fade after the baby is born.  You may have changes in your hair. These can include thickening of your hair, rapid growth, and changes in texture. Some women also have hair loss during or after pregnancy, or hair that feels dry or thin. Your hair will most likely return to normal after your baby is born. What to expect at prenatal visits During a routine prenatal visit:  You will be weighed to make sure you and the fetus are growing normally.  Your blood pressure will be taken.  Your abdomen will be measured to track your baby's growth.  The fetal heartbeat will be listened to.  Any test results from the previous visit will be discussed. Your health care provider may ask you:  How you are feeling.  If you are feeling the baby move.  If you have had any abnormal symptoms, such as leaking fluid, bleeding, severe headaches, or abdominal cramping.  If you are using any tobacco products, including cigarettes, chewing tobacco, and electronic cigarettes.  If you have any questions. Other tests that may be performed during your second trimester include:  Blood tests that check for: ? Low iron levels (anemia). ? High blood sugar that affects pregnant women (gestational diabetes) between 81 and 28 weeks. ? Rh antibodies. This is to check for a protein on red blood cells (Rh factor).  Urine tests to check for infections,  diabetes, or protein in the urine.  An ultrasound to confirm the proper growth and development of the baby.  An amniocentesis to check for possible genetic problems.  Fetal screens for spina bifida and Down syndrome.  HIV (human immunodeficiency virus) testing. Routine prenatal testing includes screening for HIV, unless you choose not to have this test. Follow these instructions at home: Medicines  Follow your health care provider's instructions regarding medicine use. Specific medicines may be either safe or unsafe to take during pregnancy.  Take a prenatal vitamin that contains at least 600 micrograms (mcg) of folic acid.  If you develop constipation, try taking a stool softener if your health care provider approves. Eating and drinking   Eat a balanced diet that includes fresh fruits and vegetables, whole grains, good sources of protein such as meat, eggs, or tofu, and low-fat dairy. Your health care provider will help you determine the amount of weight gain that is right for you.  Avoid raw meat and uncooked cheese. These carry germs that can cause birth defects in the baby.  If you have low calcium intake from food, talk to your health care provider about whether you should take a daily calcium supplement.  Limit foods that are high in fat and processed sugars, such as fried and sweet foods.  To prevent constipation: ? Drink enough fluid to keep your urine clear or pale yellow. ? Eat foods that are high in fiber, such as fresh fruits and vegetables, whole grains, and beans. Activity  Exercise only as directed by your health care provider. Most women can continue their usual exercise routine during pregnancy. Try to exercise for 30 minutes at least 5 days a week. Stop exercising if you experience uterine contractions.  Avoid heavy lifting, wear low heel shoes, and practice good posture.  A sexual relationship may be continued unless your health care provider directs you  otherwise. Relieving pain and discomfort  Wear a good support bra to prevent discomfort from breast tenderness.  Take warm sitz baths to soothe any pain or discomfort caused by hemorrhoids. Use hemorrhoid cream if your health care provider approves.  Rest with your legs elevated if you have leg cramps or low back pain.  If you develop varicose veins, wear support hose. Elevate your feet for 15 minutes, 3-4 times a day. Limit salt in your diet. Prenatal Care  Write down your questions. Take them to your prenatal visits.  Keep all your prenatal visits as told by your health care provider. This is important. Safety  Wear your seat belt at all times when driving.  Make a list of emergency phone numbers, including numbers for family, friends, the hospital, and police and fire departments. General instructions  Ask your health care provider for a referral to a local prenatal education class. Begin classes no later than the beginning of month 6 of your pregnancy.  Ask for help if you have counseling or nutritional needs during pregnancy. Your health care provider can offer advice or refer you to specialists for help with various needs.  Do not use hot tubs, steam rooms, or saunas.  Do not douche or use tampons or scented sanitary pads.  Do not cross your legs for long periods of time.  Avoid cat litter boxes and soil used by cats. These carry germs that can cause birth defects in the baby and possibly loss of the fetus by miscarriage or stillbirth.  Avoid all smoking, herbs, alcohol, and unprescribed drugs. Chemicals in these products can affect the formation and growth of  the baby.  Do not use any products that contain nicotine or tobacco, such as cigarettes and e-cigarettes. If you need help quitting, ask your health care provider.  Visit your dentist if you have not gone yet during your pregnancy. Use a soft toothbrush to brush your teeth and be gentle when you floss. Contact a  health care provider if:  You have dizziness.  You have mild pelvic cramps, pelvic pressure, or nagging pain in the abdominal area.  You have persistent nausea, vomiting, or diarrhea.  You have a bad smelling vaginal discharge.  You have pain when you urinate. Get help right away if:  You have a fever.  You are leaking fluid from your vagina.  You have spotting or bleeding from your vagina.  You have severe abdominal cramping or pain.  You have rapid weight gain or weight loss.  You have shortness of breath with chest pain.  You notice sudden or extreme swelling of your face, hands, ankles, feet, or legs.  You have not felt your baby move in over an hour.  You have severe headaches that do not go away when you take medicine.  You have vision changes. Summary  The second trimester is from week 14 through week 27 (months 4 through 6). It is also a time when the fetus is growing rapidly.  Your body goes through many changes during pregnancy. The changes vary from woman to woman.  Avoid all smoking, herbs, alcohol, and unprescribed drugs. These chemicals affect the formation and growth your baby.  Do not use any tobacco products, such as cigarettes, chewing tobacco, and e-cigarettes. If you need help quitting, ask your health care provider.  Contact your health care provider if you have any questions. Keep all prenatal visits as told by your health care provider. This is important. This information is not intended to replace advice given to you by your health care provider. Make sure you discuss any questions you have with your health care provider. Document Revised: 06/29/2018 Document Reviewed: 04/12/2016 Elsevier Patient Education  2020 [employer].   Contraception Choices Contraception, also called birth control, refers to methods or devices that prevent pregnancy. Hormonal methods Contraceptive implant  A contraceptive implant is a thin, plastic tube that  contains a hormone. It is inserted into the upper part of the arm. It can remain in place for up to 3 years. Progestin-only injections Progestin-only injections are injections of progestin, a synthetic form of the hormone progesterone. They are given every 3 months by a health care provider. Birth control pills  Birth control pills are pills that contain hormones that prevent pregnancy. They must be taken once a day, preferably at the same time each day. Birth control patch  The birth control patch contains hormones that prevent pregnancy. It is placed on the skin and must be changed once a week for three weeks and removed on the fourth week. A prescription is needed to use this method of contraception. Vaginal ring  A vaginal ring contains hormones that prevent pregnancy. It is placed in the vagina for three weeks and removed on the fourth week. After that, the process is repeated with a new ring. A prescription is needed to use this method of contraception. Emergency contraceptive Emergency contraceptives prevent pregnancy after unprotected sex. They come in pill form and can be taken up to 5 days after sex. They work best the sooner they are taken after having sex. Most emergency contraceptives are available without a prescription. This  method should not be used as your only form of birth control. Barrier methods Female condom  A female condom is a thin sheath that is worn over the penis during sex. Condoms keep sperm from going inside a woman's body. They can be used with a spermicide to increase their effectiveness. They should be disposed after a single use. Female condom  A female condom is a soft, loose-fitting sheath that is put into the vagina before sex. The condom keeps sperm from going inside a woman's body. They should be disposed after a single use. Diaphragm  A diaphragm is a soft, dome-shaped barrier. It is inserted into the vagina before sex, along with a spermicide. The  diaphragm blocks sperm from entering the uterus, and the spermicide kills sperm. A diaphragm should be left in the vagina for 6-8 hours after sex and removed within 24 hours. A diaphragm is prescribed and fitted by a health care provider. A diaphragm should be replaced every 1-2 years, after giving birth, after gaining more than 15 lb (6.8 kg), and after pelvic surgery. Cervical cap  A cervical cap is a round, soft latex or plastic cup that fits over the cervix. It is inserted into the vagina before sex, along with spermicide. It blocks sperm from entering the uterus. The cap should be left in place for 6-8 hours after sex and removed within 48 hours. A cervical cap must be prescribed and fitted by a health care provider. It should be replaced every 2 years. Sponge  A sponge is a soft, circular piece of polyurethane foam with spermicide on it. The sponge helps block sperm from entering the uterus, and the spermicide kills sperm. To use it, you make it wet and then insert it into the vagina. It should be inserted before sex, left in for at least 6 hours after sex, and removed and thrown away within 30 hours. Spermicides Spermicides are chemicals that kill or block sperm from entering the cervix and uterus. They can come as a cream, jelly, suppository, foam, or tablet. A spermicide should be inserted into the vagina with an applicator at least 29-79 minutes before sex to allow time for it to work. The process must be repeated every time you have sex. Spermicides do not require a prescription. Intrauterine contraception Intrauterine device (IUD) An IUD is a T-shaped device that is put in a woman's uterus. There are two types:  Hormone IUD.This type contains progestin, a synthetic form of the hormone progesterone. This type can stay in place for 3-5 years.  Copper IUD.This type is wrapped in copper wire. It can stay in place for 10 years.  Permanent methods of contraception Female tubal ligation In  this method, a woman's fallopian tubes are sealed, tied, or blocked during surgery to prevent eggs from traveling to the uterus. Hysteroscopic sterilization In this method, a small, flexible insert is placed into each fallopian tube. The inserts cause scar tissue to form in the fallopian tubes and block them, so sperm cannot reach an egg. The procedure takes about 3 months to be effective. Another form of birth control must be used during those 3 months. Female sterilization This is a procedure to tie off the tubes that carry sperm (vasectomy). After the procedure, the man can still ejaculate fluid (semen). Natural planning methods Natural family planning In this method, a couple does not have sex on days when the woman could become pregnant. Calendar method This means keeping track of the length of each menstrual cycle,  identifying the days when pregnancy can happen, and not having sex on those days. Ovulation method In this method, a couple avoids sex during ovulation. Symptothermal method This method involves not having sex during ovulation. The woman typically checks for ovulation by watching changes in her temperature and in the consistency of cervical mucus. Post-ovulation method In this method, a couple waits to have sex until after ovulation. Summary  Contraception, also called birth control, means methods or devices that prevent pregnancy.  Hormonal methods of contraception include implants, injections, pills, patches, vaginal rings, and emergency contraceptives.  Barrier methods of contraception can include female condoms, female condoms, diaphragms, cervical caps, sponges, and spermicides.  There are two types of IUDs (intrauterine devices). An IUD can be put in a woman's uterus to prevent pregnancy for 3-5 years.  Permanent sterilization can be done through a procedure for males, females, or both.  Natural family planning methods involve not having sex on days when the woman could  become pregnant. This information is not intended to replace advice given to you by your health care provider. Make sure you discuss any questions you have with your health care provider. Document Revised: 03/09/2017 Document Reviewed: 04/09/2016 Elsevier Patient Education  2020 [employer].   Breastfeeding  Choosing to breastfeed is one of the best decisions you can make for yourself and your baby. A change in hormones during pregnancy causes your breasts to make breast milk in your milk-producing glands. Hormones prevent breast milk from being released before your baby is born. They also prompt milk flow after birth. Once breastfeeding has begun, thoughts of your baby, as well as his or her sucking or crying, can stimulate the release of milk from your milk-producing glands. Benefits of breastfeeding Research shows that breastfeeding offers many health benefits for infants and mothers. It also offers a cost-free and convenient way to feed your baby. For your baby  Your first milk (colostrum) helps your baby's digestive system to function better.  Special cells in your milk (antibodies) help your baby to fight off infections.  Breastfed babies are less likely to develop asthma, allergies, obesity, or type 2 diabetes. They are also at lower risk for sudden infant death syndrome (SIDS).  Nutrients in breast milk are better able to meet your baby's needs compared to infant formula.  Breast milk improves your baby's brain development. For you  Breastfeeding helps to create a very special bond between you and your baby.  Breastfeeding is convenient. Breast milk costs nothing and is always available at the correct temperature.  Breastfeeding helps to burn calories. It helps you to lose the weight that you gained during pregnancy.  Breastfeeding makes your uterus return faster to its size before pregnancy. It also slows bleeding (lochia) after you give birth.  Breastfeeding helps to lower  your risk of developing type 2 diabetes, osteoporosis, rheumatoid arthritis, cardiovascular disease, and breast, ovarian, uterine, and endometrial cancer later in life. Breastfeeding basics Starting breastfeeding  Find a comfortable place to sit or lie down, with your neck and back well-supported.  Place a pillow or a rolled-up blanket under your baby to bring him or her to the level of your breast (if you are seated). Nursing pillows are specially designed to help support your arms and your baby while you breastfeed.  Make sure that your baby's tummy (abdomen) is facing your abdomen.  Gently massage your breast. With your fingertips, massage from the outer edges of your breast inward toward the nipple. This encourages  milk flow. If your milk flows slowly, you may need to continue this action during the feeding.  Support your breast with 4 fingers underneath and your thumb above your nipple (make the letter "C" with your hand). Make sure your fingers are well away from your nipple and your baby's mouth.  Stroke your baby's lips gently with your finger or nipple.  When your baby's mouth is open wide enough, quickly bring your baby to your breast, placing your entire nipple and as much of the areola as possible into your baby's mouth. The areola is the colored area around your nipple. ? More areola should be visible above your baby's upper lip than below the lower lip. ? Your baby's lips should be opened and extended outward (flanged) to ensure an adequate, comfortable latch. ? Your baby's tongue should be between his or her lower gum and your breast.  Make sure that your baby's mouth is correctly positioned around your nipple (latched). Your baby's lips should create a seal on your breast and be turned out (everted).  It is common for your baby to suck about 2-3 minutes in order to start the flow of breast milk. Latching Teaching your baby how to latch onto your breast properly is very  important. An improper latch can cause nipple pain, decreased milk supply, and poor weight gain in your baby. Also, if your baby is not latched onto your nipple properly, he or she may swallow some air during feeding. This can make your baby fussy. Burping your baby when you switch breasts during the feeding can help to get rid of the air. However, teaching your baby to latch on properly is still the best way to prevent fussiness from swallowing air while breastfeeding. Signs that your baby has successfully latched onto your nipple  Silent tugging or silent sucking, without causing you pain. Infant's lips should be extended outward (flanged).  Swallowing heard between every 3-4 sucks once your milk has started to flow (after your let-down milk reflex occurs).  Muscle movement above and in front of his or her ears while sucking. Signs that your baby has not successfully latched onto your nipple  Sucking sounds or smacking sounds from your baby while breastfeeding.  Nipple pain. If you think your baby has not latched on correctly, slip your finger into the corner of your baby's mouth to break the suction and place it between your baby's gums. Attempt to start breastfeeding again. Signs of successful breastfeeding Signs from your baby  Your baby will gradually decrease the number of sucks or will completely stop sucking.  Your baby will fall asleep.  Your baby's body will relax.  Your baby will retain a small amount of milk in his or her mouth.  Your baby will let go of your breast by himself or herself. Signs from you  Breasts that have increased in firmness, weight, and size 1-3 hours after feeding.  Breasts that are softer immediately after breastfeeding.  Increased milk volume, as well as a change in milk consistency and color by the fifth day of breastfeeding.  Nipples that are not sore, cracked, or bleeding. Signs that your baby is getting enough milk  Wetting at least 1-2  diapers during the first 24 hours after birth.  Wetting at least 5-6 diapers every 24 hours for the first week after birth. The urine should be clear or pale yellow by the age of 5 days.  Wetting 6-8 diapers every 24 hours as your baby continues  to grow and develop.  At least 3 stools in a 24-hour period by the age of 5 days. The stool should be soft and yellow.  At least 3 stools in a 24-hour period by the age of 7 days. The stool should be seedy and yellow.  No loss of weight greater than 10% of birth weight during the first 3 days of life.  Average weight gain of 4-7 oz (113-198 g) per week after the age of 4 days.  Consistent daily weight gain by the age of 5 days, without weight loss after the age of 2 weeks. After a feeding, your baby may spit up a small amount of milk. This is normal. Breastfeeding frequency and duration Frequent feeding will help you make more milk and can prevent sore nipples and extremely full breasts (breast engorgement). Breastfeed when you feel the need to reduce the fullness of your breasts or when your baby shows signs of hunger. This is called "breastfeeding on demand." Signs that your baby is hungry include:  Increased alertness, activity, or restlessness.  Movement of the head from side to side.  Opening of the mouth when the corner of the mouth or cheek is stroked (rooting).  Increased sucking sounds, smacking lips, cooing, sighing, or squeaking.  Hand-to-mouth movements and sucking on fingers or hands.  Fussing or crying. Avoid introducing a pacifier to your baby in the first 4-6 weeks after your baby is born. After this time, you may choose to use a pacifier. Research has shown that pacifier use during the first year of a baby's life decreases the risk of sudden infant death syndrome (SIDS). Allow your baby to feed on each breast as long as he or she wants. When your baby unlatches or falls asleep while feeding from the first breast, offer the  second breast. Because newborns are often sleepy in the first few weeks of life, you may need to awaken your baby to get him or her to feed. Breastfeeding times will vary from baby to baby. However, the following rules can serve as a guide to help you make sure that your baby is properly fed:  Newborns (babies 14 weeks of age or younger) may breastfeed every 1-3 hours.  Newborns should not go without breastfeeding for longer than 3 hours during the day or 5 hours during the night.  You should breastfeed your baby a minimum of 8 times in a 24-hour period. Breast milk pumping     Pumping and storing breast milk allows you to make sure that your baby is exclusively fed your breast milk, even at times when you are unable to breastfeed. This is especially important if you go back to work while you are still breastfeeding, or if you are not able to be present during feedings. Your lactation consultant can help you find a method of pumping that works best for you and give you guidelines about how long it is safe to store breast milk. Caring for your breasts while you breastfeed Nipples can become dry, cracked, and sore while breastfeeding. The following recommendations can help keep your breasts moisturized and healthy:  Avoid using soap on your nipples.  Wear a supportive bra designed especially for nursing. Avoid wearing underwire-style bras or extremely tight bras (sports bras).  Air-dry your nipples for 3-4 minutes after each feeding.  Use only cotton bra pads to absorb leaked breast milk. Leaking of breast milk between feedings is normal.  Use lanolin on your nipples after breastfeeding. Lanolin helps  to maintain your skin's normal moisture barrier. Pure lanolin is not harmful (not toxic) to your baby. You may also hand express a few drops of breast milk and gently massage that milk into your nipples and allow the milk to air-dry. In the first few weeks after giving birth, some women experience  breast engorgement. Engorgement can make your breasts feel heavy, warm, and tender to the touch. Engorgement peaks within 3-5 days after you give birth. The following recommendations can help to ease engorgement:  Completely empty your breasts while breastfeeding or pumping. You may want to start by applying warm, moist heat (in the shower or with warm, water-soaked hand towels) just before feeding or pumping. This increases circulation and helps the milk flow. If your baby does not completely empty your breasts while breastfeeding, pump any extra milk after he or she is finished.  Apply ice packs to your breasts immediately after breastfeeding or pumping, unless this is too uncomfortable for you. To do this: ? Put ice in a plastic bag. ? Place a towel between your skin and the bag. ? Leave the ice on for 20 minutes, 2-3 times a day.  Make sure that your baby is latched on and positioned properly while breastfeeding. If engorgement persists after 48 hours of following these recommendations, contact your health care provider or a Science writer. Overall health care recommendations while breastfeeding  Eat 3 healthy meals and 3 snacks every day. Well-nourished mothers who are breastfeeding need an additional 450-500 calories a day. You can meet this requirement by increasing the amount of a balanced diet that you eat.  Drink enough water to keep your urine pale yellow or clear.  Rest often, relax, and continue to take your prenatal vitamins to prevent fatigue, stress, and low vitamin and mineral levels in your body (nutrient deficiencies).  Do not use any products that contain nicotine or tobacco, such as cigarettes and e-cigarettes. Your baby may be harmed by chemicals from cigarettes that pass into breast milk and exposure to secondhand smoke. If you need help quitting, ask your health care provider.  Avoid alcohol.  Do not use illegal drugs or marijuana.  Talk with your health care  provider before taking any medicines. These include over-the-counter and prescription medicines as well as vitamins and herbal supplements. Some medicines that may be harmful to your baby can pass through breast milk.  It is possible to become pregnant while breastfeeding. If birth control is desired, ask your health care provider about options that will be safe while breastfeeding your baby. Where to find more information: [employer]: www.llli.org Contact a health care provider if:  You feel like you want to stop breastfeeding or have become frustrated with breastfeeding.  Your nipples are cracked or bleeding.  Your breasts are red, tender, or warm.  You have: ? Painful breasts or nipples. ? A swollen area on either breast. ? A fever or chills. ? Nausea or vomiting. ? Drainage other than breast milk from your nipples.  Your breasts do not become full before feedings by the fifth day after you give birth.  You feel sad and depressed.  Your baby is: ? Too sleepy to eat well. ? Having trouble sleeping. ? More than 35 week old and wetting fewer than 6 diapers in a 24-hour period. ? Not gaining weight by 52 days of age.  Your baby has fewer than 3 stools in a 24-hour period.  Your baby's skin or the white parts of  his or her eyes become yellow. Get help right away if:  Your baby is overly tired (lethargic) and does not want to wake up and feed.  Your baby develops an unexplained fever. Summary  Breastfeeding offers many health benefits for infant and mothers.  Try to breastfeed your infant when he or she shows early signs of hunger.  Gently tickle or stroke your baby's lips with your finger or nipple to allow the baby to open his or her mouth. Bring the baby to your breast. Make sure that much of the areola is in your baby's mouth. Offer one side and burp the baby before you offer the other side.  Talk with your health care provider or lactation consultant  if you have questions or you face problems as you breastfeed. This information is not intended to replace advice given to you by your health care provider. Make sure you discuss any questions you have with your health care provider. Document Revised: 06/01/2017 Document Reviewed: 04/08/2016 Elsevier Patient Education  2020 [employer]  

## 2019-11-20 LAB — CBC/D/PLT+RPR+RH+ABO+RUB AB...
Antibody Screen: NEGATIVE
Basophils Absolute: 0.1 10*3/uL (ref 0.0–0.2)
Basos: 1 %
EOS (ABSOLUTE): 0.2 10*3/uL (ref 0.0–0.4)
Eos: 2 %
HCV Ab: <0.1 s/co ratio (ref 0.0–0.9)
HIV Screen 4th Generation wRfx: NONREACTIVE
Hematocrit: 38.6 % (ref 34.0–46.6)
Hemoglobin: 13.1 g/dL (ref 11.1–15.9)
Hepatitis B Surface Ag: NEGATIVE
Immature Grans (Abs): 0 10*3/uL (ref 0.0–0.1)
Immature Granulocytes: 0 %
Lymphocytes Absolute: 1.9 10*3/uL (ref 0.7–3.1)
Lymphs: 22 %
MCH: 30.1 pg (ref 26.6–33.0)
MCHC: 33.9 g/dL (ref 31.5–35.7)
MCV: 89 fL (ref 79–97)
Monocytes Absolute: 0.7 10*3/uL (ref 0.1–0.9)
Monocytes: 8 %
Neutrophils Absolute: 5.9 10*3/uL (ref 1.4–7.0)
Neutrophils: 67 %
Platelets: 296 10*3/uL (ref 150–450)
RBC: 4.35 x10E6/uL (ref 3.77–5.28)
RDW: 13.1 % (ref 11.7–15.4)
RPR Ser Ql: NONREACTIVE
Rh Factor: POSITIVE
Rubella Antibodies, IGG: 3.06 index (ref >0.99)
WBC: 8.8 10*3/uL (ref 3.4–10.8)

## 2019-11-20 LAB — TSH: TSH: 0.514 u[IU]/mL (ref 0.450–4.500)

## 2019-11-20 LAB — HEMOGLOBIN A1C
Est. average glucose Bld gHb Est-mCnc: 108 mg/dL
Hgb A1c MFr Bld: 5.4 % (ref 4.8–5.6)

## 2019-11-20 LAB — GC/CHLAMYDIA PROBE AMP (~~LOC~~) NOT AT ARMC
Chlamydia: NEGATIVE
Comment: NEGATIVE
Comment: NORMAL
Neisseria Gonorrhea: NEGATIVE

## 2019-11-20 LAB — HCV INTERPRETATION

## 2019-11-21 LAB — URINE CULTURE, OB REFLEX

## 2019-11-21 LAB — CULTURE, OB URINE

## 2019-11-26 ENCOUNTER — Telehealth: Payer: Self-pay | Admitting: Radiology

## 2019-11-26 ENCOUNTER — Encounter: Payer: Self-pay | Admitting: Radiology

## 2019-11-26 NOTE — Telephone Encounter (Signed)
Patient was informed of Pan[facility] results and requested that I called her husband, Heath Lark to give fetal sex result.

## 2019-12-02 ENCOUNTER — Encounter: Payer: Self-pay | Admitting: Radiology

## 2019-12-02 ENCOUNTER — Telehealth: Payer: Self-pay | Admitting: Radiology

## 2019-12-02 NOTE — Telephone Encounter (Signed)
Patient was informed of results from [facility] screening. Result scanned

## 2019-12-03 ENCOUNTER — Ambulatory Visit: Payer: BLUE CROSS/BLUE SHIELD | Admitting: Nurse Practitioner

## 2019-12-17 ENCOUNTER — Ambulatory Visit (INDEPENDENT_AMBULATORY_CARE_PROVIDER_SITE_OTHER): Payer: BLUE CROSS/BLUE SHIELD | Admitting: Family Medicine

## 2019-12-17 ENCOUNTER — Other Ambulatory Visit: Payer: Self-pay

## 2019-12-17 VITALS — BP 113/74 | HR 81 | Wt 186.0 lb

## 2019-12-17 DIAGNOSIS — O34219 Maternal care for unspecified type scar from previous cesarean delivery: Secondary | ICD-10-CM

## 2019-12-17 DIAGNOSIS — Z3480 Encounter for supervision of other normal pregnancy, unspecified trimester: Secondary | ICD-10-CM

## 2019-12-17 DIAGNOSIS — O09522 Supervision of elderly multigravida, second trimester: Secondary | ICD-10-CM

## 2019-12-17 DIAGNOSIS — O99210 Obesity complicating pregnancy, unspecified trimester: Secondary | ICD-10-CM

## 2019-12-17 NOTE — Patient Instructions (Signed)

## 2019-12-18 LAB — AFP, SERUM, OPEN SPINA BIFIDA
AFP MoM: 0.78
AFP Value: 21.3 ng/mL
Gest. Age on Collection Date: 15.4 weeks
Maternal Age At EDD: 36.6 yr
OSBR Risk 1 IN: 10000
Test Results:: NEGATIVE
Weight: 186 [lb_av]

## 2019-12-18 NOTE — Progress Notes (Signed)
   PRENATAL VISIT NOTE  Subjective:  Cindy Walker is a 34 y.o. [id number] at [redacted]w[redacted]d being seen today for ongoing prenatal care.  She is currently monitored for the following issues for this high-risk pregnancy and has Migraine; Family history of cleft palate with cleft lip; Supervision of other normal pregnancy, antepartum; Previous cesarean delivery affecting pregnancy; Umbilical hernia; Mild intermittent asthma without complication; Vitamin D deficiency; Morbid obesity ([place]); Other fatigue; Breast lump on right side at 6 o'clock position; Menorrhagia with regular cycle; OCP (oral contraceptive pills) initiation; AMA (advanced maternal age) multigravida 35+; and Maternal obesity affecting pregnancy, antepartum on their problem list.  Patient reports no complaints.  Contractions: Not present. Vag. Bleeding: None.   . Denies leaking of fluid.   The following portions of the patient's history were reviewed and updated as appropriate: allergies, current medications, past family history, past medical history, past social history, past surgical history and problem list.   Objective:   Vitals:   12/17/19 1053  BP: 113/74  Pulse: 81  Weight: 186 lb (84.4 kg)    Fetal Status: Fetal Heart Rate (bpm): 148         General:  Alert, oriented and cooperative. Patient is in no acute distress.  Skin: Skin is warm and dry. No rash noted.   Cardiovascular: Normal heart rate noted  Respiratory: Normal respiratory effort, no problems with respiration noted  Abdomen: Soft, gravid, appropriate for gestational age.  Pain/Pressure: Absent     Pelvic: Cervical exam deferred        Extremities: Normal range of motion.  Edema: None  Mental Status: Normal mood and affect. Normal behavior. Normal judgment and thought content.   Assessment and Plan:  Pregnancy: [id number] at [redacted]w[redacted]d 1. Supervision of other normal pregnancy, antepartum AFP today Has anatomy u/s - AFP, Serum, Open Spina Bifida  2. Multigravida  of advanced maternal age in second trimester Normal NIPT  3. Maternal obesity affecting pregnancy, antepartum   4. Previous cesarean delivery affecting pregnancy For RCS  General obstetric precautions including but not limited to vaginal bleeding, contractions, leaking of fluid and fetal movement were reviewed in detail with the patient. Please refer to After Visit Summary for other counseling recommendations.   Return in 4 weeks (on 01/14/2020).  Future Appointments  Date Time Provider [place]  01/09/2020  9:00 AM [facility]  01/14/2020 10:15 AM Anyanwu, Sallyanne Havers, MD [facility]    [name], MD

## 2019-12-31 ENCOUNTER — Other Ambulatory Visit: Payer: Self-pay

## 2019-12-31 ENCOUNTER — Ambulatory Visit (INDEPENDENT_AMBULATORY_CARE_PROVIDER_SITE_OTHER): Payer: BLUE CROSS/BLUE SHIELD

## 2019-12-31 DIAGNOSIS — O09522 Supervision of elderly multigravida, second trimester: Secondary | ICD-10-CM

## 2019-12-31 DIAGNOSIS — O99210 Obesity complicating pregnancy, unspecified trimester: Secondary | ICD-10-CM

## 2019-12-31 NOTE — Progress Notes (Signed)
Pt called stating she has felt flutters but no longer felt them in the last 3 or 4 days. Pt request FHR. FHR 152 via doppler. Pt instructed to follow up at next ROB.

## 2020-01-02 NOTE — Progress Notes (Signed)
FHR listened for one minute  Patient was assessed and managed by nursing staff during this encounter. I have reviewed the chart and agree with the documentation and plan. I have also made any necessary editorial changes.  Aletha Halim, MD 01/02/2020 9:39 PM

## 2020-01-09 ENCOUNTER — Ambulatory Visit: Payer: BLUE CROSS/BLUE SHIELD | Attending: Obstetrics and Gynecology

## 2020-01-09 ENCOUNTER — Other Ambulatory Visit: Payer: Self-pay

## 2020-01-09 DIAGNOSIS — Z8279 Family history of other congenital malformations, deformations and chromosomal abnormalities: Secondary | ICD-10-CM | POA: Diagnosis not present

## 2020-01-09 DIAGNOSIS — O321XX0 Maternal care for breech presentation, not applicable or unspecified: Secondary | ICD-10-CM | POA: Insufficient documentation

## 2020-01-09 DIAGNOSIS — O09522 Supervision of elderly multigravida, second trimester: Secondary | ICD-10-CM | POA: Diagnosis not present

## 2020-01-09 DIAGNOSIS — O99212 Obesity complicating pregnancy, second trimester: Secondary | ICD-10-CM | POA: Insufficient documentation

## 2020-01-09 DIAGNOSIS — O34219 Maternal care for unspecified type scar from previous cesarean delivery: Secondary | ICD-10-CM | POA: Insufficient documentation

## 2020-01-09 DIAGNOSIS — E669 Obesity, unspecified: Secondary | ICD-10-CM | POA: Diagnosis not present

## 2020-01-09 DIAGNOSIS — Z3A18 18 weeks gestation of pregnancy: Secondary | ICD-10-CM | POA: Diagnosis not present

## 2020-01-09 DIAGNOSIS — O99210 Obesity complicating pregnancy, unspecified trimester: Secondary | ICD-10-CM

## 2020-01-14 ENCOUNTER — Encounter: Payer: Self-pay | Admitting: Obstetrics & Gynecology

## 2020-01-14 ENCOUNTER — Other Ambulatory Visit: Payer: Self-pay

## 2020-01-14 ENCOUNTER — Ambulatory Visit (INDEPENDENT_AMBULATORY_CARE_PROVIDER_SITE_OTHER): Payer: BLUE CROSS/BLUE SHIELD | Admitting: Obstetrics & Gynecology

## 2020-01-14 VITALS — BP 127/79 | HR 85 | Wt 192.0 lb

## 2020-01-14 DIAGNOSIS — Z3A19 19 weeks gestation of pregnancy: Secondary | ICD-10-CM

## 2020-01-14 DIAGNOSIS — Z23 Encounter for immunization: Secondary | ICD-10-CM

## 2020-01-14 DIAGNOSIS — Z3480 Encounter for supervision of other normal pregnancy, unspecified trimester: Secondary | ICD-10-CM

## 2020-01-14 DIAGNOSIS — Z8279 Family history of other congenital malformations, deformations and chromosomal abnormalities: Secondary | ICD-10-CM

## 2020-01-14 DIAGNOSIS — O09522 Supervision of elderly multigravida, second trimester: Secondary | ICD-10-CM

## 2020-01-14 NOTE — Progress Notes (Signed)
PRENATAL VISIT NOTE  Subjective:  Cindy Walker is a 34 y.o. [phone] at [redacted]w[redacted]d being seen today for ongoing prenatal care.  She is currently monitored for the following issues for this low-risk pregnancy and has Migraine; Family history of cleft palate with cleft lip; Supervision of other normal pregnancy, antepartum; Previous cesarean delivery affecting pregnancy; Umbilical hernia; Mild intermittent asthma without complication; Vitamin D deficiency; Morbid obesity ([place]); Other fatigue; Breast lump on right side at 6 o'clock position; Menorrhagia with regular cycle; OCP (oral contraceptive pills) initiation; AMA (advanced maternal age) multigravida 35+; and Maternal obesity affecting pregnancy, antepartum on their problem list.  Patient reports no complaints.  Contractions: Not present. Vag. Bleeding: None.  Movement: Present. Denies leaking of fluid.   The following portions of the patient's history were reviewed and updated as appropriate: allergies, current medications, past family history, past medical history, past social history, past surgical history and problem list.   Objective:   Vitals:   01/14/20 1019  BP: 127/79  Pulse: 85  Weight: 192 lb (87.1 kg)    Fetal Status: Fetal Heart Rate (bpm): 153   Movement: Present     General:  Alert, oriented and cooperative. Patient is in no acute distress.  Skin: Skin is warm and dry. No rash noted.   Cardiovascular: Normal heart rate noted  Respiratory: Normal respiratory effort, no problems with respiration noted  Abdomen: Soft, gravid, appropriate for gestational age.  Pain/Pressure: Absent     Pelvic: Cervical exam deferred        Extremities: Normal range of motion.  Edema: None  Mental Status: Normal mood and affect. Normal behavior. Normal judgment and thought content.   [place] MFM OB DETAIL +14 WK  Result Date: 01/09/2020 ----------------------------------------------------------------------  OBSTETRICS REPORT                          (Signed Final 01/09/2020 10:24 am) ---------------------------------------------------------------------- Patient Info  ID #:        [id number]                          D.O.B.:  [birth year] (34 yrs)  Name:        Cindy Walker            Visit Date: 01/09/2020 09:47 am ---------------------------------------------------------------------- Performed By  Attending:         Johnell Comings MD         Ref. Address:      Faculty  Performed By:      Christena Deem RDMS        [facility]                                                               [facility]  [facility]  Referred By:       Vickii Chafe CONSTANT                     MD ---------------------------------------------------------------------- Orders  #   Description                          Code         Ordered By  1   [place] MFM OB DETAIL +14 WK              76811.01     PEGGY CONSTANT ----------------------------------------------------------------------  #   Order #                    Accession #                 Episode #  1   [id number]                  [id number]                  [id number] ---------------------------------------------------------------------- Indications  Obesity complicating pregnancy, second          O99.212  trimester  Advanced maternal age multigravida 68+,         O12.522  second trimester  [redacted] weeks gestation of pregnancy                 Z3A.18  History of cesarean delivery, currently         O67.219  pregnant  Family history of cleft lip and palate          Z82.79 ---------------------------------------------------------------------- Fetal Evaluation  Num Of Fetuses:          1  Fetal Heart Rate(bpm):   141  Cardiac Activity:        Observed  Presentation:            Breech  Placenta:                Posterior  P. Cord Insertion:       Visualized, central                               Largest Pocket(cm)                              3.7 ---------------------------------------------------------------------- Biometry  BPD:      42.4   mm     G. Age:  18w 6d         50  %    CI:         71.48  %    70 - 86                                                           FL/HC:       19.4  %    16.1 - 18.3  HC:      159.7   mm     G. Age:  18w 6d         40  %    HC/AC:       1.14  1.09 - 1.39  AC:      140.1   mm     G. Age:  19w 3d         65  %    FL/BPD:      73.1  %  FL:         31   mm     G. Age:  19w 4d         71  %    FL/AC:       22.1  %    20 - 24  HUM:      27.6   mm     G. Age:  18w 6d         50  %  CER:      19.1   mm     G. Age:  18w 5d         29  %  NFT:        3.8  mm  LV:         5.9  mm  CM:         3.2  mm  Est. FW:     291   gm    0 lb 10 oz      78  % ---------------------------------------------------------------------- Gestational Age  LMP:            18w 6d       Date:  08/30/19                   EDD:  06/05/20  U/S Today:      19w 1d                                         EDD:  06/03/20  Best:           18w 6d    Det. By:  LMP  (08/30/19)            EDD:  06/05/20 ---------------------------------------------------------------------- Anatomy  Cranium:                Appears normal         Aortic Arch:            Appears normal  Cavum:                  Appears normal         Ductal Arch:            Appears normal  Ventricles:             Appears normal         Diaphragm:              Appears normal  Choroid Plexus:         Appears normal         Stomach:                Appears normal, left  sided  Cerebellum:             Appears normal         Abdomen:                Appears normal  Posterior Fossa:        Appears normal         Abdominal Wall:         Appears nml (cord                                                                         insert, abd wall)  Nuchal Fold:            Appears normal          Cord Vessels:           Appears normal (3                                                                         vessel cord)  Face:                   Appears normal         Kidneys:                Appear normal                          (orbits and profile)  Lips:                   Appears normal         Bladder:                Appears normal  Heart:                  Appears normal         Spine:                  Appears normal                          (4CH, axis, and situs)  RVOT:                   Appears normal         Upper Extremities:      Appears normal  LVOT:                   Appears normal         Lower Extremities:      Appears normal ---------------------------------------------------------------------- Cervix Uterus Adnexa  Cervix  Length:            5.04  cm. ---------------------------------------------------------------------- Comments  This patient was seen for a detailed fetal anatomy scan due to  advanced maternal age and maternal obesity.  She reports that  her first child was born with  a cleft lip and palate.  She denies any significant past medical history and denies any  problems in her current pregnancy.  She had a cell free DNA test earlier in her pregnancy which  indicated a low risk for trisomy 18, 70, and 13. A female fetus is  predicted.  She was informed that the fetal growth and amniotic fluid level  were appropriate for her gestational age.  There were no obvious fetal anomalies noted on today's  ultrasound exam.  A cleft lip was not noted today.  However,  today's exam was limited due to the fetal position and maternal  body habitus.  The patient was informed that anomalies may be missed due to  technical limitations. If the fetus is in a suboptimal position or  maternal habitus is increased, visualization of the fetus in the  maternal uterus may be impaired.  The increased risk of fetal aneuploidy due to advanced maternal  age was discussed. Due to advanced maternal age, the  patient  was offered and declined an amniocentesis today for definitive  diagnosis of fetal aneuploidy.  She is comfortable with her  negative cell free DNA test.  A follow-up was scheduled in 4 weeks to obtain better views  of the fetal anatomy and to reassess the fetal lips/palate. ----------------------------------------------------------------------                    Johnell Comings, MD Electronically Signed Final Report   01/09/2020 10:24 am ----------------------------------------------------------------------   Assessment and Plan:  Pregnancy: [id number] at [redacted]w[redacted]d 1. Flu vaccine need - Flu Vaccine QUAD 36+ mos IM (Fluarix, Fluzone & Afluria Quad PF) given today.  2. Family history of cleft palate with cleft lip Not visualized on scan, but follow up also ordered.  3. [redacted] weeks gestation of pregnancy 4. Multigravida of advanced maternal age in second trimester 5. Supervision of other normal pregnancy, antepartum No other issues.  Preterm labor symptoms and general obstetric precautions including but not limited to vaginal bleeding, contractions, leaking of fluid and fetal movement were reviewed in detail with the patient. Please refer to After Visit Summary for other counseling recommendations.   Return in about 4 weeks (around 02/11/2020) for OFFICE OB Visit.  Future Appointments  Date Time Provider [place]  02/06/2020  9:00 AM [facility] [place]    Verita Schneiders, MD

## 2020-01-14 NOTE — Patient Instructions (Signed)

## 2020-01-20 ENCOUNTER — Ambulatory Visit
Admission: RE | Admit: 2020-01-20 | Discharge: 2020-01-20 | Disposition: A | Discharge status: Home / Self Care | Payer: BLUE CROSS/BLUE SHIELD | Source: Ambulatory Visit | Attending: Internal Medicine | Admitting: Internal Medicine

## 2020-01-20 ENCOUNTER — Other Ambulatory Visit: Payer: Self-pay

## 2020-01-20 VITALS — BP 118/62 | HR 74 | Temp 98.8°F | Resp 16

## 2020-01-20 DIAGNOSIS — O99512 Diseases of the respiratory system complicating pregnancy, second trimester: Secondary | ICD-10-CM | POA: Insufficient documentation

## 2020-01-20 DIAGNOSIS — J069 Acute upper respiratory infection, unspecified: Secondary | ICD-10-CM | POA: Insufficient documentation

## 2020-01-20 DIAGNOSIS — Z20822 Contact with and (suspected) exposure to covid-19: Secondary | ICD-10-CM | POA: Insufficient documentation

## 2020-01-20 DIAGNOSIS — Z3A20 20 weeks gestation of pregnancy: Secondary | ICD-10-CM | POA: Diagnosis not present

## 2020-01-20 LAB — GROUP A STREP BY PCR: Group A Strep by PCR: NOT DETECTED

## 2020-01-20 NOTE — ED Triage Notes (Signed)
Patient c/o sore throat, cough, and fever. Patient stated symptoms began last week (Wednesday).   Patient states "it hurts to swallow".  Patient states highest fever at home was 101F.   Patient has taken OTC cough and cold at home w/o any relief of symptoms.   Patient is [redacted] weeks pregnant.   History of Asthma. Patient states she's had to use her rescue inhaler more than usual.

## 2020-01-20 NOTE — Discharge Instructions (Addendum)
Isolate at home until the results of your Covid test are known.  If you are positive you will have to quarantine for 10 days from the start of your symptoms.  After 10 days you can break quarantine if your symptoms have improved and you have not had a fever for 24 hours.  Continue to use over-the-counter Tylenol as needed for fever and body aches.  Can talk to your obstetrician and see if they will allow ibuprofen.  Perform sinus rinsing 2-3 times a day with distilled water and a NeilMed sinus rinse kit.  If your Covid test is positive we will refer you to the infusion clinic for monoclonal antibody infusion.

## 2020-01-20 NOTE — ED Provider Notes (Signed)
[facility]    CSN: [id number] Arrival date & time: 01/20/20  1552      History   Chief Complaint Chief Complaint  Patient presents with   Sore Throat    HPI Cindy Walker is a 34 y.o. female.   34 year old female here for evaluation of sore throat, cough, fever, and body aches.  She is currently [redacted] weeks pregnant.  She has not been vaccinated gets Covid.  Patient denies runny nose, sinus pressure, ear pressure, changes to taste or smell, nausea, vomiting, diarrhea, or sick contacts.     Past Medical History:  Diagnosis Date   Asthma    prn inhaler   Migraine    Umbilical hernia 11/2424    Patient Active Problem List   Diagnosis Date Noted   AMA (advanced maternal age) multigravida 35+ 11/19/2019   Maternal obesity affecting pregnancy, antepartum 11/19/2019   Menorrhagia with regular cycle 09/06/2019   OCP (oral contraceptive pills) initiation 09/06/2019   Breast lump on right side at 6 o'clock position 05/27/2019   Other fatigue 08/01/2018   Vitamin D deficiency 06/21/2018   Morbid obesity ([place]) 06/21/2018   Mild intermittent asthma without complication 83/41/9622   Umbilical hernia 29/79/8921   Previous cesarean delivery affecting pregnancy 03/11/2014   Supervision of other normal pregnancy, antepartum 02/26/2014   Family history of cleft palate with cleft lip 03/22/2012   Migraine 02/24/2012    Past Surgical History:  Procedure Laterality Date   BREAST CYST EXCISION Left 2014   CESAREAN SECTION  01/11/11; 09/27/2014   CESAREAN SECTION N/A 04/16/2012   Procedure: Cesarean Section;  Surgeon: Guss Bunde, MD;  Location: [facility];  Service: Obstetrics;  Laterality: N/A;  Wound Class (Clean contaminated)   INSERTION OF MESH N/A 12/01/2014   Procedure: INSERTION OF MESH;  Surgeon: Coralie Keens, MD;  Location: [place];  Service: General;  Laterality: N/A;   UMBILICAL HERNIA REPAIR N/A 12/01/2014    Procedure: UMBILICAL HERNIA REPAIR ;  Surgeon: Coralie Keens, MD;  Location: [place];  Service: General;  Laterality: N/A;   WISDOM TOOTH EXTRACTION      OB History    Gravida  4   Para  3   Term  2   Preterm  1   AB      Living  2     SAB      TAB      Ectopic      Multiple      Live Births  3        Obstetric Comments  Son passed in 2/13-delivered c-section         Home Medications    Prior to Admission medications   Medication Sig Start Date End Date Taking? Authorizing Provider  albuterol (PROVENTIL HFA;VENTOLIN HFA) 108 (90 Base) MCG/ACT inhaler Inhale 2 puffs into the lungs every 6 (six) hours as needed for wheezing. 04/27/17  Yes Ronnell Freshwater, NP  aspirin EC 81 MG tablet Take 1 tablet (81 mg total) by mouth daily. Take after 12 weeks for prevention of preeclampsia later in pregnancy 11/19/19  Yes Constant, Peggy, MD  Prenatal Vit-Fe Fumarate-FA (MULTIVITAMIN-PRENATAL) 27-0.8 MG TABS tablet Take 1 tablet by mouth daily at 12 noon.   Yes [provider]  butalbital-acetaminophen-caffeine (FIORICET) 50-325-40 MG tablet Take 1 tablet by mouth every 6 (six) hours as needed for headache. 11/11/19 11/10/20  Ronnell Freshwater, NP  Doxylamine-Pyridoxine (DICLEGIS) 10-10 MG TBEC Take 2  tablets by mouth at bedtime. If symptoms persist, add one tablet in the morning and one in the afternoon 11/19/19   Constant, Peggy, MD  promethazine (PHENERGAN) 25 MG tablet Take 1 tablet (25 mg total) by mouth every 6 (six) hours as needed for nausea or vomiting. 11/19/19   Constant, Peggy, MD    Family History Family History  Problem Relation Age of Onset   Diabetes Maternal Aunt        Type 2   Heart disease Maternal Grandmother    Cancer Maternal Grandfather        liver cancer   Heart disease Maternal Grandfather    Heart disease Paternal Grandmother    Heart disease Paternal Grandfather    Cleft palate Son        deceased   Breast  cancer Neg Hx     Social History Social History   Tobacco Use   Smoking status: Former Smoker    Packs/day: 0.00    Quit date: 12/19/2008    Years since quitting: 11.0   Smokeless tobacco: Never Used  Vaping Use   Vaping Use: Never used  Substance Use Topics   Alcohol use: Not Currently    Alcohol/week: 3.0 standard drinks    Types: 3 Glasses of wine per week    Comment: occasionally   Drug use: No     Allergies   Patient has no known allergies.   Review of Systems Review of Systems  Constitutional: Positive for fever. Negative for activity change and appetite change.  HENT: Positive for sore throat. Negative for ear pain, rhinorrhea, sinus pressure and sinus pain.   Respiratory: Positive for cough. Negative for shortness of breath and wheezing.   Cardiovascular: Negative for chest pain.  Gastrointestinal: Negative for diarrhea, nausea and vomiting.  Musculoskeletal: Negative for arthralgias and myalgias.  Skin: Negative for rash.  Neurological: Negative for dizziness and numbness.  Hematological: Negative.   Psychiatric/Behavioral: Negative.      Physical Exam Triage Vital Signs ED Triage Vitals  Enc Vitals Group     BP 01/20/20 1622 118/62     Pulse Rate 01/20/20 1622 74     Resp 01/20/20 1622 16     Temp 01/20/20 1622 98.8 F (37.1 C)     Temp Source 01/20/20 1622 Oral     SpO2 01/20/20 1622 100 %     Weight --      Height --      Head Circumference --      Peak Flow --      Pain Score 01/20/20 1620 8     Pain Loc --      Pain Edu? --      Excl. in [place]? --    No data found.  Updated Vital Signs BP 118/62 (BP Location: Left Arm)    Pulse 74    Temp 98.8 F (37.1 C) (Oral)    Resp 16    LMP 08/30/2019    SpO2 100%   Visual Acuity Right Eye Distance:   Left Eye Distance:   Bilateral Distance:    Right Eye Near:   Left Eye Near:    Bilateral Near:     Physical Exam Vitals and nursing note reviewed.  Constitutional:      General: She is  not in acute distress.    Appearance: She is well-developed and normal weight.  HENT:     Head: Normocephalic and atraumatic.     Right Ear: Tympanic membrane and  ear canal normal. No middle ear effusion. Tympanic membrane is not erythematous.     Left Ear: Tympanic membrane and ear canal normal.  No middle ear effusion. Tympanic membrane is not erythematous.     Nose: Congestion and rhinorrhea present.     Comments: Nasal mucosa is erythematous and edematous with copious clear nasal discharge.    Mouth/Throat:     Mouth: Mucous membranes are moist.     Pharynx: Oropharynx is clear. Posterior oropharyngeal erythema present. No pharyngeal swelling or oropharyngeal exudate.     Tonsils: 0 on the right. 0 on the left.     Comments: Posterior oropharynx is erythematous with copious clear postnasal drip.  Tonsillar pillars are normal in size without erythema, edema, or exudate. Eyes:     Conjunctiva/sclera: Conjunctivae normal.     Pupils: Pupils are equal, round, and reactive to light.  Cardiovascular:     Rate and Rhythm: Normal rate and regular rhythm.     Heart sounds: Normal heart sounds. No murmur heard.  No gallop.   Pulmonary:     Effort: Pulmonary effort is normal.     Breath sounds: Normal breath sounds. No wheezing, rhonchi or rales.  Musculoskeletal:     Cervical back: Normal range of motion and neck supple.  Lymphadenopathy:     Cervical: No cervical adenopathy.  Skin:    General: Skin is warm and dry.     Capillary Refill: Capillary refill takes less than 2 seconds.     Findings: No erythema or rash.  Neurological:     General: No focal deficit present.     Mental Status: She is alert and oriented to person, place, and time.  Psychiatric:        Mood and Affect: Mood normal.        Behavior: Behavior normal.      UC Treatments / Results  Labs (all labs ordered are listed, but only abnormal results are displayed) Labs Reviewed  GROUP A STREP BY PCR  SARS  CORONAVIRUS 2 (TAT 6-24 HRS)    EKG   Radiology No results found.  Procedures Procedures (including critical care time)  Medications Ordered in UC Medications - No data to display  Initial Impression / Assessment and Plan / UC Course  I have reviewed the triage vital signs and the nursing notes.  Pertinent labs & imaging results that were available during my care of the patient were reviewed by me and considered in my medical decision making (see chart for details).   Patient is [redacted] weeks pregnant and has been experiencing a sore throat that hurts when she swallows, cough, fever up to 101.  She has not been vaccinated against Covid.  She denies runny nose but her nasal mucosa is markedly edematous and erythematous with copious clear nasal discharge.  Posterior oropharynx is erythematous with clear postnasal drip as well.  Physical exam not consistent with strep.  Lung sounds clear to auscultation.  Will send strep PCR and Covid.  Strep PCR is negative.  Covid pending.  Will DC home with diagnosis of viral URI and have patient use supportive therapy.  If Covid is positive will refer to infusion clinic for monoclonal antibody infusion.   Final Clinical Impressions(s) / UC Diagnoses   Final diagnoses:  Viral URI with cough     Discharge Instructions     Isolate at home until the results of your Covid test are known.  If you are positive you will have to quarantine  for 10 days from the start of your symptoms.  After 10 days you can break quarantine if your symptoms have improved and you have not had a fever for 24 hours.  Continue to use over-the-counter Tylenol as needed for fever and body aches.  Can talk to your obstetrician and see if they will allow ibuprofen.  Perform sinus rinsing 2-3 times a day with distilled water and a NeilMed sinus rinse kit.  If your Covid test is positive we will refer you to the infusion clinic for monoclonal antibody infusion.    ED  Prescriptions    None     PDMP not reviewed this encounter.   Margarette Canada, NP 01/20/20 1724

## 2020-01-21 LAB — SARS CORONAVIRUS 2 (TAT 6-24 HRS): SARS Coronavirus 2: NEGATIVE

## 2020-01-22 ENCOUNTER — Encounter (HOSPITAL_COMMUNITY): Payer: Self-pay | Admitting: Obstetrics and Gynecology

## 2020-01-22 ENCOUNTER — Emergency Department
Admission: EM | Admit: 2020-01-22 | Discharge: 2020-01-22 | Discharge status: Against Medical Advice | Payer: BLUE CROSS/BLUE SHIELD

## 2020-01-22 ENCOUNTER — Inpatient Hospital Stay (HOSPITAL_COMMUNITY)
Admission: AD | Admit: 2020-01-22 | Discharge: 2020-01-22 | Disposition: A | Discharge status: Home / Self Care | Payer: BLUE CROSS/BLUE SHIELD | Attending: Obstetrics and Gynecology | Admitting: Obstetrics and Gynecology

## 2020-01-22 ENCOUNTER — Other Ambulatory Visit: Payer: Self-pay

## 2020-01-22 DIAGNOSIS — Z3A20 20 weeks gestation of pregnancy: Secondary | ICD-10-CM | POA: Diagnosis not present

## 2020-01-22 DIAGNOSIS — O211 Hyperemesis gravidarum with metabolic disturbance: Secondary | ICD-10-CM | POA: Diagnosis not present

## 2020-01-22 DIAGNOSIS — J069 Acute upper respiratory infection, unspecified: Secondary | ICD-10-CM | POA: Diagnosis not present

## 2020-01-22 DIAGNOSIS — O99282 Endocrine, nutritional and metabolic diseases complicating pregnancy, second trimester: Secondary | ICD-10-CM | POA: Diagnosis not present

## 2020-01-22 DIAGNOSIS — O210 Mild hyperemesis gravidarum: Secondary | ICD-10-CM | POA: Diagnosis not present

## 2020-01-22 DIAGNOSIS — Z20822 Contact with and (suspected) exposure to covid-19: Secondary | ICD-10-CM | POA: Diagnosis not present

## 2020-01-22 DIAGNOSIS — O99210 Obesity complicating pregnancy, unspecified trimester: Secondary | ICD-10-CM

## 2020-01-22 DIAGNOSIS — O99512 Diseases of the respiratory system complicating pregnancy, second trimester: Secondary | ICD-10-CM | POA: Diagnosis not present

## 2020-01-22 DIAGNOSIS — O219 Vomiting of pregnancy, unspecified: Secondary | ICD-10-CM

## 2020-01-22 DIAGNOSIS — E876 Hypokalemia: Secondary | ICD-10-CM | POA: Insufficient documentation

## 2020-01-22 DIAGNOSIS — O99212 Obesity complicating pregnancy, second trimester: Secondary | ICD-10-CM | POA: Insufficient documentation

## 2020-01-22 DIAGNOSIS — Z87891 Personal history of nicotine dependence: Secondary | ICD-10-CM | POA: Insufficient documentation

## 2020-01-22 DIAGNOSIS — E669 Obesity, unspecified: Secondary | ICD-10-CM

## 2020-01-22 DIAGNOSIS — O09522 Supervision of elderly multigravida, second trimester: Secondary | ICD-10-CM

## 2020-01-22 DIAGNOSIS — R509 Fever, unspecified: Secondary | ICD-10-CM | POA: Diagnosis present

## 2020-01-22 LAB — COMPREHENSIVE METABOLIC PANEL
ALT: 24 U/L (ref 0–44)
AST: 19 U/L (ref 15–41)
Albumin: 3.3 g/dL — ABNORMAL LOW (ref 3.5–5.0)
Alkaline Phosphatase: 47 U/L (ref 38–126)
Anion gap: 13 (ref 5–15)
BUN: <5 mg/dL — ABNORMAL LOW (ref 6–20)
CO2: 18 mmol/L — ABNORMAL LOW (ref 22–32)
Calcium: 8.4 mg/dL — ABNORMAL LOW (ref 8.9–10.3)
Chloride: 102 mmol/L (ref 98–111)
Creatinine, Ser: 0.51 mg/dL (ref 0.44–1.00)
GFR, Estimated: >60 mL/min (ref >60)
Glucose, Bld: 110 mg/dL — ABNORMAL HIGH (ref 70–99)
Potassium: 2.3 mmol/L — CL (ref 3.5–5.1)
Sodium: 133 mmol/L — ABNORMAL LOW (ref 135–145)
Total Bilirubin: 0.9 mg/dL (ref 0.3–1.2)
Total Protein: 6.4 g/dL — ABNORMAL LOW (ref 6.5–8.1)

## 2020-01-22 LAB — URINALYSIS, ROUTINE W REFLEX MICROSCOPIC
Bilirubin Urine: NEGATIVE
Glucose, UA: NEGATIVE mg/dL
Hgb urine dipstick: NEGATIVE
Ketones, ur: 80 mg/dL — AB
Leukocytes,Ua: NEGATIVE
Nitrite: NEGATIVE
Protein, ur: 100 mg/dL — AB
Specific Gravity, Urine: 1.025 (ref 1.005–1.030)
pH: 6 (ref 5.0–8.0)

## 2020-01-22 LAB — CBC WITH DIFFERENTIAL/PLATELET
Abs Immature Granulocytes: 0.07 10*3/uL (ref 0.00–0.07)
Basophils Absolute: 0 10*3/uL (ref 0.0–0.1)
Basophils Relative: 0 %
Eosinophils Absolute: 0 10*3/uL (ref 0.0–0.5)
Eosinophils Relative: 0 %
HCT: 33.8 % — ABNORMAL LOW (ref 36.0–46.0)
Hemoglobin: 11.2 g/dL — ABNORMAL LOW (ref 12.0–15.0)
Immature Granulocytes: 1 %
Lymphocytes Relative: 7 %
Lymphs Abs: 1.1 10*3/uL (ref 0.7–4.0)
MCH: 29.3 pg (ref 26.0–34.0)
MCHC: 33.1 g/dL (ref 30.0–36.0)
MCV: 88.5 fL (ref 80.0–100.0)
Monocytes Absolute: 0.6 10*3/uL (ref 0.1–1.0)
Monocytes Relative: 4 %
Neutro Abs: 13.2 10*3/uL — ABNORMAL HIGH (ref 1.7–7.7)
Neutrophils Relative %: 88 %
Platelets: 255 10*3/uL (ref 150–400)
RBC: 3.82 MIL/uL — ABNORMAL LOW (ref 3.87–5.11)
RDW: 13.2 % (ref 11.5–15.5)
WBC: 15 10*3/uL — ABNORMAL HIGH (ref 4.0–10.5)
nRBC: 0 % (ref 0.0–0.2)

## 2020-01-22 LAB — RESPIRATORY PANEL BY RT PCR (FLU A&B, COVID)
Influenza A by PCR: NEGATIVE
Influenza B by PCR: NEGATIVE
SARS Coronavirus 2 by RT PCR: NEGATIVE

## 2020-01-22 LAB — MAGNESIUM: Magnesium: 1.5 mg/dL — ABNORMAL LOW (ref 1.7–2.4)

## 2020-01-22 MED ORDER — ONDANSETRON 4 MG PO TBDP: 4.0000 mg | ORAL_TABLET | Freq: Four times a day (QID) | ORAL | 0 refills | Status: DC | PRN

## 2020-01-22 MED ORDER — DEXAMETHASONE SODIUM PHOSPHATE 10 MG/ML IJ SOLN
10.0000 mg | Freq: Once | INTRAMUSCULAR | Status: AC
  Administered 2020-01-22: 10 mg via INTRAVENOUS
  Filled 2020-01-22: qty 1

## 2020-01-22 MED ORDER — POTASSIUM CHLORIDE 10 MEQ/100ML IV SOLN
10.0000 meq | INTRAVENOUS | Status: AC
  Administered 2020-01-22 (×2): 10 meq via INTRAVENOUS
  Filled 2020-01-22 (×2): qty 100

## 2020-01-22 MED ORDER — POTASSIUM CHLORIDE CRYS ER 20 MEQ PO TBCR
20.0000 meq | EXTENDED_RELEASE_TABLET | Freq: Once | ORAL | Status: AC
  Administered 2020-01-22: 20 meq via ORAL
  Filled 2020-01-22: qty 1

## 2020-01-22 MED ORDER — LACTATED RINGERS IV BOLUS
1000.0000 mL | INTRAVENOUS | Status: AC
  Administered 2020-01-22 (×2): 1000 mL via INTRAVENOUS

## 2020-01-22 MED ORDER — METOCLOPRAMIDE HCL 5 MG/ML IJ SOLN
10.0000 mg | Freq: Once | INTRAMUSCULAR | Status: AC
  Administered 2020-01-22: 10 mg via INTRAVENOUS
  Filled 2020-01-22: qty 2

## 2020-01-22 MED ORDER — POTASSIUM CHLORIDE ER 10 MEQ PO CPCR: 20.0000 meq | ORAL_CAPSULE | Freq: Two times a day (BID) | ORAL | 0 refills | Status: DC

## 2020-01-22 MED ORDER — BENZONATATE 100 MG PO CAPS: 100.0000 mg | ORAL_CAPSULE | Freq: Three times a day (TID) | ORAL | 0 refills | Status: DC | PRN

## 2020-01-22 MED ORDER — DIPHENHYDRAMINE HCL 50 MG/ML IJ SOLN
25.0000 mg | Freq: Once | INTRAMUSCULAR | Status: AC
  Administered 2020-01-22: 25 mg via INTRAVENOUS
  Filled 2020-01-22: qty 1

## 2020-01-22 MED ORDER — LACTATED RINGERS IV SOLN: INTRAVENOUS | Status: DC

## 2020-01-22 NOTE — MAU Note (Signed)
CRITICAL VALUE STICKER  CRITICAL VALUE: potassium 2.3  RECEIVER (on-site recipient of call): Reather Converse RN  DATE & TIME NOTIFIED: 01/22/20 0122  MESSENGER (representative from lab): zelda (lab)  MD NOTIFIED: veronica rogers cnm  TIME OF NOTIFICATION: 2411  RESPONSE: no new orders recieved

## 2020-01-22 NOTE — Discharge Instructions (Signed)
Safe Medications in Pregnancy   Colds/Coughs/Allergies: Benadryl (alcohol free) 25 mg every 6 hours as needed Breath right strips Claritin Cepacol throat lozenges Chloraseptic throat spray Cold-Eeze- up to three times per day Cough drops, alcohol free Flonase (by prescription only) Guaifenesin Mucinex Robitussin DM (plain only, alcohol free) Saline nasal spray/drops Sudafed (pseudoephedrine) & Actifed ** use only after [redacted] weeks gestation and if you do not have high blood pressure Tylenol Vicks Vaporub Zinc lozenges Zyrtec    Nausea/Vomiting:  Bonine Dramamine Emetrol Ginger extract Sea bands Meclizine  Nausea medication to take during pregnancy:  Unisom (doxylamine succinate 25 mg tablets) Take one tablet daily at bedtime. If symptoms are not adequately controlled, the dose can be increased to a maximum recommended dose of two tablets daily (1/2 tablet in the morning, 1/2 tablet mid-afternoon and one at bedtime). Vitamin B6 100mg  tablets. Take one tablet twice a day (up to 200 mg per day).

## 2020-01-22 NOTE — MAU Note (Signed)
Presents with c/o fever, nausea/vomiting.  Reports has vomited 5-6 times since last night.  Has a fever, 102.6, last took Tylenol @ 0400.  Seen in Urgent Care on Monday, Covid swab obtained, negative. Denies VB or LOF.

## 2020-01-22 NOTE — MAU Provider Note (Signed)
History     CSN: [id number]  Arrival date and time: 01/22/20 5397   First Provider Initiated Contact with Patient 01/22/20 [phone]      Chief Complaint  Patient presents with  . Emesis  . Nausea  . Fever   Cindy Walker is a  34 y.o. G4P2 at [redacted]w[redacted]d who presents to MAU with complaints of fever, headache, nausea and emesis. Patient reports that symptoms started occurring last week. Was seen at urgent care on Monday and tested for strep throat and covid - both negative. Patient was not testing for flu. Patient reports getting her flu vaccine last week, has not been vaccinated against covid. Patient reports that symptoms initially started as sore throat- patient states this is the worse sore throat of her life, cough, and body aches. Reports that nausea and emesis started occurring early this week. Patient reports that she had 5-6 occurrences of emesis last night, has not been able to keep anything down for 1-2 days. She also reports waking up this morning in a sweat, took temp which was 102.6, patient reports fever is associated with HA- took tylenol @ 0400 for fever and headache. Temp in MAU upon arrival 98.4.   She denies any pregnancy concerns. Denies abdominal pain, vaginal bleeding, vaginal discharge. Denies vision changes, RUQ pain. +FM.    OB History    Gravida  4   Para  3   Term  2   Preterm  1   AB      Living  2     SAB      TAB      Ectopic      Multiple      Live Births  3        Obstetric Comments  Son passed in 2/13-delivered c-section        Past Medical History:  Diagnosis Date  . Asthma    prn inhaler  . Migraine   . Umbilical hernia 03/9377    Past Surgical History:  Procedure Laterality Date  . BREAST CYST EXCISION Left 2014  . CESAREAN SECTION  01/11/11; 09/27/2014  . CESAREAN SECTION N/A 04/16/2012   Procedure: Cesarean Section;  Surgeon: Guss Bunde, MD;  Location: [facility];  Service: Obstetrics;  Laterality: N/A;  Wound Class  (Clean contaminated)  . INSERTION OF MESH N/A 12/01/2014   Procedure: INSERTION OF MESH;  Surgeon: Coralie Keens, MD;  Location: [place];  Service: General;  Laterality: N/A;  . UMBILICAL HERNIA REPAIR N/A 12/01/2014   Procedure: UMBILICAL HERNIA REPAIR ;  Surgeon: Coralie Keens, MD;  Location: [place];  Service: General;  Laterality: N/A;  . WISDOM TOOTH EXTRACTION      Family History  Problem Relation Age of Onset  . Diabetes Maternal Aunt        Type 2  . Heart disease Maternal Grandmother   . Cancer Maternal Grandfather        liver cancer  . Heart disease Maternal Grandfather   . Heart disease Paternal Grandmother   . Heart disease Paternal Grandfather   . Cleft palate Son        deceased  . Breast cancer Neg Hx     Social History   Tobacco Use  . Smoking status: Former Smoker    Packs/day: 0.00    Quit date: 12/19/2008    Years since quitting: 11.0  . Smokeless tobacco: Never Used  Vaping Use  . Vaping Use: Never used  Substance Use Topics  . Alcohol use: Not Currently    Alcohol/week: 3.0 standard drinks    Types: 3 Glasses of wine per week    Comment: occasionally  . Drug use: No    Allergies: No Known Allergies  Medications Prior to Admission  Medication Sig Dispense Refill Last Dose  . albuterol (PROVENTIL HFA;VENTOLIN HFA) 108 (90 Base) MCG/ACT inhaler Inhale 2 puffs into the lungs every 6 (six) hours as needed for wheezing. 1 Inhaler 3 01/22/2020 at Unknown time  . aspirin EC 81 MG tablet Take 1 tablet (81 mg total) by mouth daily. Take after 12 weeks for prevention of preeclampsia later in pregnancy 300 tablet 2 01/21/2020 at Unknown time  . Doxylamine-Pyridoxine (DICLEGIS) 10-10 MG TBEC Take 2 tablets by mouth at bedtime. If symptoms persist, add one tablet in the morning and one in the afternoon 100 tablet 5 Past Week at Unknown time  . Prenatal Vit-Fe Fumarate-FA (MULTIVITAMIN-PRENATAL) 27-0.8 MG TABS tablet Take 1  tablet by mouth daily at 12 noon.   01/21/2020 at Unknown time  . butalbital-acetaminophen-caffeine (FIORICET) 50-325-40 MG tablet Take 1 tablet by mouth every 6 (six) hours as needed for headache. 45 tablet 1   . promethazine (PHENERGAN) 25 MG tablet Take 1 tablet (25 mg total) by mouth every 6 (six) hours as needed for nausea or vomiting. 30 tablet 2     Review of Systems  Constitutional: Positive for fever.       Body aches  HENT: Positive for sore throat.   Respiratory: Positive for cough. Negative for chest tightness, shortness of breath and wheezing.   Cardiovascular: Negative.   Gastrointestinal: Positive for nausea and vomiting. Negative for abdominal pain, constipation and diarrhea.  Genitourinary: Negative.   Musculoskeletal: Negative.   Neurological: Positive for headaches.   Physical Exam   Blood pressure (!) 111/59, pulse 91, temperature 98.9 F (37.2 C), temperature source Oral, resp. rate 20, height 5\' 3"  (1.6 m), weight 83.8 kg, last menstrual period 08/30/2019, SpO2 98 %.  Physical Exam Vitals and nursing note reviewed.  Constitutional:      Appearance: She is ill-appearing and diaphoretic.  HENT:     Head: Normocephalic.  Cardiovascular:     Rate and Rhythm: Normal rate and regular rhythm.  Pulmonary:     Effort: Pulmonary effort is normal. No respiratory distress.     Breath sounds: Normal breath sounds. No wheezing.  Abdominal:     Palpations: Abdomen is soft. There is no mass.     Tenderness: There is no abdominal tenderness. There is no guarding.     Comments: Gravid appropriate for gestational age  Musculoskeletal:     Right lower leg: No edema.     Left lower leg: No edema.  Skin:    General: Skin is warm.  Neurological:     Mental Status: She is alert and oriented to person, place, and time.  Psychiatric:        Mood and Affect: Mood normal.        Behavior: Behavior normal.        Thought Content: Thought content normal.    MAU Course   Procedures  MDM Will perform flu panel and repeat covid based on symptoms and new onset of fever.   Orders Placed This Encounter  Procedures  . Respiratory Panel by RT PCR (Flu A&B, Covid) - Nasopharyngeal Swab  . Urinalysis, Routine w reflex microscopic Urine, Clean Catch  . CBC with Differential/Platelet  . Comprehensive metabolic  panel  . Insert peripheral IV   Meds ordered this encounter  Medications  . AND Linked Order Group   . diphenhydrAMINE (BENADRYL) injection 25 mg   . metoCLOPramide (REGLAN) injection 10 mg   . dexamethasone (DECADRON) injection 10 mg  . lactated ringers bolus 1,000 mL  . potassium chloride 10 mEq in 100 mL IVPB  . lactated ringers infusion  . potassium chloride SA (KLOR-CON) CR tablet 20 mEq  . benzonatate (TESSALON) 100 MG capsule    Sig: Take 1 capsule (100 mg total) by mouth 3 (three) times daily as needed for cough.    Dispense:  14 capsule    Refill:  0    Order Specific Question:   Supervising Provider    Answer:   CONSTANT, PEGGY [[id number]]  . potassium chloride (MICRO-K) 10 MEQ CR capsule    Sig: Take 2 capsules (20 mEq total) by mouth 2 (two) times daily.    Dispense:  12 capsule    Refill:  0    Order Specific Question:   Supervising Provider    Answer:   CONSTANT, PEGGY [[id number]]  . ondansetron (ZOFRAN ODT) 4 MG disintegrating tablet    Sig: Take 1 tablet (4 mg total) by mouth every 6 (six) hours as needed for nausea or vomiting.    Dispense:  15 tablet    Refill:  0    Order Specific Question:   Supervising Provider    Answer:   CONSTANT, PEGGY [[id number]]   Treatments in MAU include HA cocktail and IV LR bolus x2  Labs reviewed:  Results for orders placed or performed during the hospital encounter of 01/22/20 (from the past 24 hour(s))  Urinalysis, Routine w reflex microscopic Urine, Clean Catch     Status: Abnormal   Collection Time: 01/22/20  8:23 AM  Result Value Ref Range   Color, Urine AMBER (A) YELLOW   APPearance HAZY (A) CLEAR    Specific Gravity, Urine 1.025 1.005 - 1.030   pH 6.0 5.0 - 8.0   Glucose, UA NEGATIVE NEGATIVE mg/dL   Hgb urine dipstick NEGATIVE NEGATIVE   Bilirubin Urine NEGATIVE NEGATIVE   Ketones, ur 80 (A) NEGATIVE mg/dL   Protein, ur 100 (A) NEGATIVE mg/dL   Nitrite NEGATIVE NEGATIVE   Leukocytes,Ua NEGATIVE NEGATIVE   RBC / HPF 0-5 0 - 5 RBC/hpf   WBC, UA 6-10 0 - 5 WBC/hpf   Bacteria, UA RARE (A) NONE SEEN   Squamous Epithelial / LPF 11-20 0 - 5   Mucus PRESENT   CBC with Differential/Platelet     Status: Abnormal   Collection Time: 01/22/20  8:57 AM  Result Value Ref Range   WBC 15.0 (H) 4.0 - 10.5 K/uL   RBC 3.82 (L) 3.87 - 5.11 MIL/uL   Hemoglobin 11.2 (L) 12.0 - 15.0 g/dL   HCT 33.8 (L) 36 - 46 %   MCV 88.5 80.0 - 100.0 fL   MCH 29.3 26.0 - 34.0 pg   MCHC 33.1 30.0 - 36.0 g/dL   RDW 13.2 11.5 - 15.5 %   Platelets 255 150 - 400 K/uL   nRBC 0.0 0.0 - 0.2 %   Neutrophils Relative % 88 %   Neutro Abs 13.2 (H) 1.7 - 7.7 K/uL   Lymphocytes Relative 7 %   Lymphs Abs 1.1 0.7 - 4.0 K/uL   Monocytes Relative 4 %   Monocytes Absolute 0.6 0.1 - 1.0 K/uL   Eosinophils Relative 0 %   Eosinophils Absolute 0.0  0.0 - 0.5 K/uL   Basophils Relative 0 %   Basophils Absolute 0.0 0.0 - 0.1 K/uL   Immature Granulocytes 1 %   Abs Immature Granulocytes 0.07 0.00 - 0.07 K/uL  Comprehensive metabolic panel     Status: Abnormal   Collection Time: 01/22/20  8:57 AM  Result Value Ref Range   Sodium 133 (L) 135 - 145 mmol/L   Potassium 2.3 (LL) 3.5 - 5.1 mmol/L   Chloride 102 98 - 111 mmol/L   CO2 18 (L) 22 - 32 mmol/L   Glucose, Bld 110 (H) 70 - 99 mg/dL   BUN <5 (L) 6 - 20 mg/dL   Creatinine, Ser 0.51 0.44 - 1.00 mg/dL   Calcium 8.4 (L) 8.9 - 10.3 mg/dL   Total Protein 6.4 (L) 6.5 - 8.1 g/dL   Albumin 3.3 (L) 3.5 - 5.0 g/dL   AST 19 15 - 41 U/L   ALT 24 0 - 44 U/L   Alkaline Phosphatase 47 38 - 126 U/L   Total Bilirubin 0.9 0.3 - 1.2 mg/dL   GFR, Estimated >60 >60 mL/min   Anion gap 13 5  - 15  Respiratory Panel by RT PCR (Flu A&B, Covid) - Nasopharyngeal Swab     Status: None   Collection Time: 01/22/20  8:57 AM   Specimen: Nasopharyngeal Swab  Result Value Ref Range   SARS Coronavirus 2 by RT PCR NEGATIVE NEGATIVE   Influenza A by PCR NEGATIVE NEGATIVE   Influenza B by PCR NEGATIVE NEGATIVE  Magnesium     Status: Abnormal   Collection Time: 01/22/20 10:05 AM  Result Value Ref Range   Magnesium 1.5 (L) 1.7 - 2.4 mg/dL   Flu and COVID negative  Lab called with urgent result - K 2.3, will give patient potassium runs while in MAU due to patient having emesis.   Reassessment after IV hydration, HA cocktail and potassium, patient sitting up in bed feeling much better. Not clammy or pale. Will give patient KDur PO- if patient able toleration PO will discharge her with oral supplementation.  Consult with Dr Elly Modena who agrees with plan of care.   Patient able to tolerate PO. Discussed reasons to return to MAU. Follow up as scheduled in the office. Return to MAU as needed. Pt stable at time of discharge.  Rx for zofran, tessalon pearls and Micro K.   Assessment and Plan   1. Viral URI with cough   2. Multigravida of advanced maternal age in second trimester   3. Maternal obesity affecting pregnancy, antepartum   4. Nausea and vomiting during pregnancy   5. Hypokalemia   6. [redacted] weeks gestation of pregnancy    Discharge home Follow up as scheduled in the office for prenatal care Return to MAU as needed for reasons discussed and/or emergencies  Rx for zofran, tessalon pearls and Micro K.    Follow-up [place] for [facility] Follow up.   Specialty: Obstetrics and Gynecology Contact information: [place] [phone]             Allergies as of 01/22/2020   No Known Allergies     Medication List    TAKE these medications   albuterol 108 (90 Base) MCG/ACT inhaler Commonly  known as: VENTOLIN HFA Inhale 2 puffs into the lungs every 6 (six) hours as needed for wheezing.   aspirin EC 81 MG tablet Take 1 tablet (81 mg  total) by mouth daily. Take after 12 weeks for prevention of preeclampsia later in pregnancy   benzonatate 100 MG capsule Commonly known as: TESSALON Take 1 capsule (100 mg total) by mouth 3 (three) times daily as needed for cough.   butalbital-acetaminophen-caffeine 50-325-40 MG tablet Commonly known as: FIORICET Take 1 tablet by mouth every 6 (six) hours as needed for headache.   Doxylamine-Pyridoxine 10-10 MG Tbec Commonly known as: Diclegis Take 2 tablets by mouth at bedtime. If symptoms persist, add one tablet in the morning and one in the afternoon   multivitamin-prenatal 27-0.8 MG Tabs tablet Take 1 tablet by mouth daily at 12 noon.   ondansetron 4 MG disintegrating tablet Commonly known as: Zofran ODT Take 1 tablet (4 mg total) by mouth every 6 (six) hours as needed for nausea or vomiting.   potassium chloride 10 MEQ CR capsule Commonly known as: MICRO-K Take 2 capsules (20 mEq total) by mouth 2 (two) times daily.   promethazine 25 MG tablet Commonly known as: PHENERGAN Take 1 tablet (25 mg total) by mouth every 6 (six) hours as needed for nausea or vomiting.       Lajean Manes CNM 01/22/2020, 2:11 PM

## 2020-01-25 ENCOUNTER — Encounter (HOSPITAL_COMMUNITY): Payer: Self-pay | Admitting: Obstetrics & Gynecology

## 2020-01-25 ENCOUNTER — Other Ambulatory Visit: Payer: Self-pay

## 2020-01-25 ENCOUNTER — Inpatient Hospital Stay (HOSPITAL_COMMUNITY)
Admission: AD | Admit: 2020-01-25 | Discharge: 2020-01-25 | Disposition: A | Discharge status: Home / Self Care | Payer: BLUE CROSS/BLUE SHIELD | Attending: Obstetrics & Gynecology | Admitting: Obstetrics & Gynecology

## 2020-01-25 ENCOUNTER — Inpatient Hospital Stay (HOSPITAL_COMMUNITY): Payer: BLUE CROSS/BLUE SHIELD

## 2020-01-25 DIAGNOSIS — O99512 Diseases of the respiratory system complicating pregnancy, second trimester: Secondary | ICD-10-CM | POA: Insufficient documentation

## 2020-01-25 DIAGNOSIS — R0789 Other chest pain: Secondary | ICD-10-CM | POA: Diagnosis not present

## 2020-01-25 DIAGNOSIS — R059 Cough, unspecified: Secondary | ICD-10-CM

## 2020-01-25 DIAGNOSIS — Z7982 Long term (current) use of aspirin: Secondary | ICD-10-CM | POA: Insufficient documentation

## 2020-01-25 DIAGNOSIS — O09522 Supervision of elderly multigravida, second trimester: Secondary | ICD-10-CM | POA: Insufficient documentation

## 2020-01-25 DIAGNOSIS — Z3A21 21 weeks gestation of pregnancy: Secondary | ICD-10-CM | POA: Insufficient documentation

## 2020-01-25 DIAGNOSIS — Z87891 Personal history of nicotine dependence: Secondary | ICD-10-CM | POA: Diagnosis not present

## 2020-01-25 DIAGNOSIS — O98512 Other viral diseases complicating pregnancy, second trimester: Secondary | ICD-10-CM | POA: Insufficient documentation

## 2020-01-25 DIAGNOSIS — J4520 Mild intermittent asthma, uncomplicated: Secondary | ICD-10-CM | POA: Insufficient documentation

## 2020-01-25 DIAGNOSIS — J069 Acute upper respiratory infection, unspecified: Secondary | ICD-10-CM | POA: Diagnosis not present

## 2020-01-25 DIAGNOSIS — O99511 Diseases of the respiratory system complicating pregnancy, first trimester: Secondary | ICD-10-CM

## 2020-01-25 DIAGNOSIS — Z79899 Other long term (current) drug therapy: Secondary | ICD-10-CM | POA: Insufficient documentation

## 2020-01-25 DIAGNOSIS — R051 Acute cough: Secondary | ICD-10-CM | POA: Insufficient documentation

## 2020-01-25 NOTE — Discharge Instructions (Signed)
Viral Respiratory Infection A viral respiratory infection is an illness that affects parts of the body that are used for breathing. These include the lungs, nose, and throat. It is caused by a germ called a virus. Some examples of this kind of infection are:  A cold.  The flu (influenza).  A respiratory syncytial virus (RSV) infection. A person who gets this illness may have the following symptoms:  A stuffy or runny nose.  Yellow or green fluid in the nose.  A cough.  Sneezing.  Tiredness (fatigue).  Achy muscles.  A sore throat.  Sweating or chills.  A fever.  A headache. Follow these instructions at home: Managing pain and congestion  Take over-the-counter and prescription medicines only as told by your doctor.  If you have a sore throat, gargle with salt water. Do this 3-4 times per day or as needed. To make a salt-water mixture, dissolve -1 tsp of salt in 1 cup of warm water. Make sure that all the salt dissolves.  Use nose drops made from salt water. This helps with stuffiness (congestion). It also helps soften the skin around your nose.  Drink enough fluid to keep your pee (urine) pale yellow. General instructions   Rest as much as possible.  Do not drink alcohol.  Do not use any products that have nicotine or tobacco, such as cigarettes and e-cigarettes. If you need help quitting, ask your doctor.  Keep all follow-up visits as told by your doctor. This is important. How is this prevented?   Get a flu shot every year. Ask your doctor when you should get your flu shot.  Do not let other people get your germs. If you are sick: ? Stay home from work or school. ? Wash your hands with soap and water often. Wash your hands after you cough or sneeze. If soap and water are not available, use hand sanitizer.  Avoid contact with people who are sick during cold and flu season. This is in fall and winter. Get help if:  Your symptoms last for 10 days or  longer.  Your symptoms get worse over time.  You have a fever.  You have very bad pain in your face or forehead.  Parts of your jaw or neck become very swollen. Get help right away if:  You feel pain or pressure in your chest.  You have shortness of breath.  You faint or feel like you will faint.  You keep throwing up (vomiting).  You feel confused. Summary  A viral respiratory infection is an illness that affects parts of the body that are used for breathing.  Examples of this illness include a cold, the flu, and respiratory syncytial virus (RSV) infection.  The infection can cause a runny nose, cough, sneezing, sore throat, and fever.  Follow what your doctor tells you about taking medicines, drinking lots of fluid, washing your hands, resting at home, and avoiding people who are sick. This information is not intended to replace advice given to you by your health care provider. Make sure you discuss any questions you have with your health care provider. Document Revised: 03/15/2018 Document Reviewed: 04/17/2017 Elsevier Patient Education  2020 [employer].

## 2020-01-25 NOTE — MAU Provider Note (Addendum)
History     CSN: [id number]  Arrival date and time: 01/25/20 2051   First Provider Initiated Contact with Patient 01/25/20 2205      Chief Complaint  Patient presents with  . Cough   HPI Cindy Walker is a 34 y.o. [phone] at [redacted]w[redacted]d who presents to MAU with chief complaint of cough, sore throat, and chest tightness. These are recurrent problems for which patient has been evaluated at Urgent Care and MAU on 01/22/2020. Patient states she is "feeling much better" but is concerned about lingering cough.  She is managing her symptoms with Tessalon Pearls, Robitussin, intermittent use of her inhaler and Mucinex. She denies weakness, syncope, abdominal pain, dysuria, fever. She states her entire household is ill with identical symptoms and her husband has recently become symptomatic. Patient and her husband verbalize "we really just want to make sure there's no pneumonia".  Patient receives care with [facility]  OB History    Gravida  4   Para  3   Term  2   Preterm  1   AB      Living  2     SAB      TAB      Ectopic      Multiple      Live Births  3        Obstetric Comments  Son passed in 2/13-delivered c-section        Past Medical History:  Diagnosis Date  . Asthma    prn inhaler  . Migraine   . Umbilical hernia 06/1935    Past Surgical History:  Procedure Laterality Date  . BREAST CYST EXCISION Left 2014  . CESAREAN SECTION  01/11/11; 09/27/2014  . CESAREAN SECTION N/A 04/16/2012   Procedure: Cesarean Section;  Surgeon: Guss Bunde, MD;  Location: [place] [facility];  Service: Obstetrics;  Laterality: N/A;  Wound Class (Clean contaminated)  . INSERTION OF MESH N/A 12/01/2014   Procedure: INSERTION OF MESH;  Surgeon: Coralie Keens, MD;  Location: [place];  Service: General;  Laterality: N/A;  . UMBILICAL HERNIA REPAIR N/A 12/01/2014   Procedure: UMBILICAL HERNIA REPAIR ;  Surgeon: Coralie Keens, MD;  Location: [place];  Service: General;  Laterality: N/A;  . WISDOM TOOTH EXTRACTION      Family History  Problem Relation Age of Onset  . Diabetes Maternal Aunt        Type 2  . Heart disease Maternal Grandmother   . Cancer Maternal Grandfather        liver cancer  . Heart disease Maternal Grandfather   . Heart disease Paternal Grandmother   . Heart disease Paternal Grandfather   . Cleft palate Son        deceased  . Breast cancer Neg Hx     Social History   Tobacco Use  . Smoking status: Former Smoker    Packs/day: 0.00    Quit date: 12/19/2008    Years since quitting: 11.1  . Smokeless tobacco: Never Used  Vaping Use  . Vaping Use: Never used  Substance Use Topics  . Alcohol use: Not Currently    Alcohol/week: 3.0 standard drinks    Types: 3 Glasses of wine per week    Comment: occasionally  . Drug use: No    Allergies: No Known Allergies  Medications Prior to Admission  Medication Sig Dispense Refill Last Dose  . albuterol (PROVENTIL HFA;VENTOLIN HFA) 108 (90 Base) MCG/ACT inhaler Inhale  2 puffs into the lungs every 6 (six) hours as needed for wheezing. 1 Inhaler 3 01/25/2020 at Unknown time  . aspirin EC 81 MG tablet Take 1 tablet (81 mg total) by mouth daily. Take after 12 weeks for prevention of preeclampsia later in pregnancy 300 tablet 2 01/24/2020 at Unknown time  . benzonatate (TESSALON) 100 MG capsule Take 1 capsule (100 mg total) by mouth 3 (three) times daily as needed for cough. 14 capsule 0 01/25/2020 at Unknown time  . Doxylamine-Pyridoxine (DICLEGIS) 10-10 MG TBEC Take 2 tablets by mouth at bedtime. If symptoms persist, add one tablet in the morning and one in the afternoon 100 tablet 5 Past Month at Unknown time  . ondansetron (ZOFRAN ODT) 4 MG disintegrating tablet Take 1 tablet (4 mg total) by mouth every 6 (six) hours as needed for nausea or vomiting. 15 tablet 0 01/24/2020 at Unknown time  . potassium chloride (MICRO-K) 10 MEQ CR capsule Take 2 capsules (20 mEq  total) by mouth 2 (two) times daily. 12 capsule 0 01/25/2020 at Unknown time  . Prenatal Vit-Fe Fumarate-FA (MULTIVITAMIN-PRENATAL) 27-0.8 MG TABS tablet Take 1 tablet by mouth daily at 12 noon.   01/24/2020 at Unknown time  . butalbital-acetaminophen-caffeine (FIORICET) 50-325-40 MG tablet Take 1 tablet by mouth every 6 (six) hours as needed for headache. 45 tablet 1   . promethazine (PHENERGAN) 25 MG tablet Take 1 tablet (25 mg total) by mouth every 6 (six) hours as needed for nausea or vomiting. 30 tablet 2     Review of Systems  Constitutional: Positive for fatigue. Negative for fever.  HENT: Positive for sore throat.   Respiratory: Positive for cough and shortness of breath.   Cardiovascular: Negative for chest pain and palpitations.  All other systems reviewed and are negative.  Physical Exam   Blood pressure (!) 116/55, pulse 84, temperature 98.5 F (36.9 C), temperature source Oral, resp. rate 18, height 5\' 3"  (1.6 m), weight 84.8 kg, last menstrual period 08/30/2019, SpO2 99 %.  Physical Exam Vitals and nursing note reviewed. Exam conducted with a chaperone present.  Constitutional:      Appearance: She is ill-appearing.  HENT:     Mouth/Throat:     Mouth: Mucous membranes are moist.  Eyes:     Conjunctiva/sclera: Conjunctivae normal.  Cardiovascular:     Rate and Rhythm: Normal rate.     Pulses: Normal pulses.     Heart sounds: Normal heart sounds.  Pulmonary:     Effort: Pulmonary effort is normal. No respiratory distress.     Breath sounds: Normal breath sounds. No stridor. No wheezing, rhonchi or rales.  Abdominal:     Tenderness: There is no abdominal tenderness. There is no right CVA tenderness or left CVA tenderness.     Comments: Gravid  Skin:    General: Skin is warm and dry.     Capillary Refill: Capillary refill takes less than 2 seconds.  Neurological:     General: No focal deficit present.     Mental Status: She is alert.  Psychiatric:        Mood and  Affect: Mood normal.        Behavior: Behavior normal.        Thought Content: Thought content normal.        Judgment: Judgment normal.     MAU Course  Procedures   --Discussed Mucinex as mucolytic, likely worsening cough.  Advised use during morning --Previously hypokalemic. S/p IV Potassium and outpatient  rx Micro-K --Lungs CTAB --Mild intermittent asthma, low suspicion for exacerbation. Pt states she is "not overusing" inhaler --Previously diagnosed with viral URI 01/22/2020, consistent with today's evaluation, continue supportive care   Patient Vitals for the past 24 hrs:  BP Temp Temp src Pulse Resp SpO2 Height Weight  01/25/20 2230 (!) 117/47 -- -- 80 -- -- -- --  01/25/20 2200 -- -- -- -- -- 98 % -- --  01/25/20 2122 (!) 116/55 98.5 F (36.9 C) Oral 84 18 -- 5\' 3"  (1.6 m) --  01/25/20 2120 -- -- -- -- -- 99 % -- --  01/25/20 2113 -- -- -- -- -- -- -- 84.8 kg    Assessment and Plan  --34 y.o. G4P2102 at [redacted]w[redacted]d  --FHT 165 by Doppler --Viral URI, VSS --Negative COVID x 2, repeat not indicated --No concerning findings on chest XRay  --Discharge home in stable condition with supportive care  F/U: --Next appointment [facility] is 11/23  Darlina Rumpf, [place] 01/25/2020, 11:35 PM

## 2020-01-25 NOTE — MAU Note (Signed)
..  Cindy Walker is a 34 y.o. at [redacted]w[redacted]d here in MAU reporting: a persistant cough. Pt reports she was seen for sore throat, fever, and cough on Oct. 26th and that has resolved but her coughing has not. She has been taking her prescribed medication and robitussin at home but has not found relief.  She describes 4/10 chest pain that "feels like an elephant sitting on top of it." that worsens when she coughs.  Denies vaginal bleeding, leaking of fluid, or abnormal vaginal discharge. Reports fetal movement.  Pain score: 4/10 Vitals:   01/25/20 2120 01/25/20 2122  BP:  (!) 116/55  Pulse:  84  Resp:  18  Temp:  98.5 F (36.9 C)  SpO2: 99%      FHT: doppler 165

## 2020-02-03 ENCOUNTER — Other Ambulatory Visit: Payer: Self-pay | Admitting: Obstetrics and Gynecology

## 2020-02-03 DIAGNOSIS — O09522 Supervision of elderly multigravida, second trimester: Secondary | ICD-10-CM

## 2020-02-03 DIAGNOSIS — Z98891 History of uterine scar from previous surgery: Secondary | ICD-10-CM

## 2020-02-03 DIAGNOSIS — Z8279 Family history of other congenital malformations, deformations and chromosomal abnormalities: Secondary | ICD-10-CM

## 2020-02-03 DIAGNOSIS — O99212 Obesity complicating pregnancy, second trimester: Secondary | ICD-10-CM

## 2020-02-06 ENCOUNTER — Ambulatory Visit: Payer: BLUE CROSS/BLUE SHIELD | Attending: Obstetrics

## 2020-02-06 ENCOUNTER — Other Ambulatory Visit: Payer: Self-pay

## 2020-02-06 DIAGNOSIS — Z8279 Family history of other congenital malformations, deformations and chromosomal abnormalities: Secondary | ICD-10-CM | POA: Diagnosis not present

## 2020-02-06 DIAGNOSIS — O34219 Maternal care for unspecified type scar from previous cesarean delivery: Secondary | ICD-10-CM | POA: Insufficient documentation

## 2020-02-06 DIAGNOSIS — O09522 Supervision of elderly multigravida, second trimester: Secondary | ICD-10-CM | POA: Diagnosis not present

## 2020-02-06 DIAGNOSIS — O99212 Obesity complicating pregnancy, second trimester: Secondary | ICD-10-CM | POA: Insufficient documentation

## 2020-02-06 DIAGNOSIS — Z3A22 22 weeks gestation of pregnancy: Secondary | ICD-10-CM | POA: Insufficient documentation

## 2020-02-06 DIAGNOSIS — Z98891 History of uterine scar from previous surgery: Secondary | ICD-10-CM

## 2020-02-06 DIAGNOSIS — O99210 Obesity complicating pregnancy, unspecified trimester: Secondary | ICD-10-CM

## 2020-02-11 ENCOUNTER — Ambulatory Visit (INDEPENDENT_AMBULATORY_CARE_PROVIDER_SITE_OTHER): Payer: BLUE CROSS/BLUE SHIELD | Admitting: Obstetrics & Gynecology

## 2020-02-11 ENCOUNTER — Other Ambulatory Visit: Payer: Self-pay

## 2020-02-11 VITALS — BP 108/69 | HR 85 | Wt 191.0 lb

## 2020-02-11 DIAGNOSIS — Z3A23 23 weeks gestation of pregnancy: Secondary | ICD-10-CM

## 2020-02-11 DIAGNOSIS — Z8279 Family history of other congenital malformations, deformations and chromosomal abnormalities: Secondary | ICD-10-CM

## 2020-02-11 DIAGNOSIS — Z3480 Encounter for supervision of other normal pregnancy, unspecified trimester: Secondary | ICD-10-CM

## 2020-02-11 DIAGNOSIS — O09522 Supervision of elderly multigravida, second trimester: Secondary | ICD-10-CM

## 2020-02-11 DIAGNOSIS — O34219 Maternal care for unspecified type scar from previous cesarean delivery: Secondary | ICD-10-CM

## 2020-02-11 NOTE — Progress Notes (Addendum)
   PRENATAL VISIT NOTE  Subjective:  Cindy Walker is a 34 y.o. [phone] at [redacted]w[redacted]d being seen today for ongoing prenatal care.  She is currently monitored for the following issues for this high-risk pregnancy and has Migraine; Family history of cleft palate with cleft lip; Supervision of other normal pregnancy, antepartum; Previous cesarean delivery affecting pregnancy; Umbilical hernia; Mild intermittent asthma without complication; Vitamin D deficiency; Morbid obesity ([place]); Other fatigue; Breast lump on right side at 6 o'clock position; Menorrhagia with regular cycle; OCP (oral contraceptive pills) initiation; AMA (advanced maternal age) multigravida 35+; and Maternal obesity affecting pregnancy, antepartum on their problem list.  Patient reports no complaints.  Contractions: Not present. Vag. Bleeding: None.  Movement: Present. Denies leaking of fluid.   The following portions of the patient's history were reviewed and updated as appropriate: allergies, current medications, past family history, past medical history, past social history, past surgical history and problem list.   Objective:   Vitals:   02/11/20 1025  BP: 108/69  Pulse: 85  Weight: 191 lb (86.6 kg)    Fetal Status: Fetal Heart Rate (bpm): 168   Movement: Present    Fundal height 23cm  General:  Alert, oriented and cooperative. Patient is in no acute distress.  Skin: Skin is warm and dry. No rash noted.   Cardiovascular: Normal heart rate noted  Respiratory: Normal respiratory effort, no problems with respiration noted  Abdomen: Soft, gravid, appropriate for gestational age.  Pain/Pressure: Absent     Pelvic: Cervical exam deferred        Extremities: Normal range of motion.  Edema: None  Mental Status: Normal mood and affect. Normal behavior. Normal judgment and thought content.   Assessment and Plan:  Pregnancy: G4P2102 at [redacted]w[redacted]d 1. [redacted] weeks gestation of pregnancy Follow up 4 weeks.  2 hr GTT, CBC, RPR will be  obtained then.  Tdap discussed and pt does want this at follow up visit.  Declines Covid vaccination.  Influenza 01/14/2020  2. Multigravida of advanced maternal age in second trimester Continue 81mg .    3. Family history of cleft palate with cleft lip Follow up ultrasound 02/06/2020 was normal.    4. Previous cesarean delivery affecting pregnancy Pt planning repeat cesarean section with BTL.   Preterm labor symptoms and general obstetric precautions including but not limited to vaginal bleeding, contractions, leaking of fluid and fetal movement were reviewed in detail with the patient. Please refer to After Visit Summary for other counseling recommendations.   Return in about 4 weeks (around 03/10/2020) for Office ob visit (MD or APP), 3rd trimester labs (RPR, HIV, CBC), Tdap.  Future Appointments  Date Time Provider [place]  03/10/2020  9:15 AM Anyanwu, Sallyanne Havers, MD [facility]    Megan Salon, MD

## 2020-03-10 ENCOUNTER — Ambulatory Visit (INDEPENDENT_AMBULATORY_CARE_PROVIDER_SITE_OTHER): Payer: BLUE CROSS/BLUE SHIELD | Admitting: Obstetrics & Gynecology

## 2020-03-10 ENCOUNTER — Other Ambulatory Visit: Payer: Self-pay

## 2020-03-10 VITALS — BP 113/71 | HR 85 | Wt 194.0 lb

## 2020-03-10 DIAGNOSIS — Z23 Encounter for immunization: Secondary | ICD-10-CM | POA: Diagnosis not present

## 2020-03-10 DIAGNOSIS — Z3480 Encounter for supervision of other normal pregnancy, unspecified trimester: Secondary | ICD-10-CM

## 2020-03-10 DIAGNOSIS — O34219 Maternal care for unspecified type scar from previous cesarean delivery: Secondary | ICD-10-CM

## 2020-03-10 DIAGNOSIS — O26843 Uterine size-date discrepancy, third trimester: Secondary | ICD-10-CM

## 2020-03-10 DIAGNOSIS — O99013 Anemia complicating pregnancy, third trimester: Secondary | ICD-10-CM

## 2020-03-10 DIAGNOSIS — Z8639 Personal history of other endocrine, nutritional and metabolic disease: Secondary | ICD-10-CM

## 2020-03-10 DIAGNOSIS — D509 Iron deficiency anemia, unspecified: Secondary | ICD-10-CM

## 2020-03-10 DIAGNOSIS — Z3A27 27 weeks gestation of pregnancy: Secondary | ICD-10-CM

## 2020-03-10 DIAGNOSIS — O24419 Gestational diabetes mellitus in pregnancy, unspecified control: Secondary | ICD-10-CM

## 2020-03-10 NOTE — Progress Notes (Signed)
° °  PRENATAL VISIT NOTE  Subjective:  Cindy Walker is a 34 y.o. [phone] at [redacted]w[redacted]d being seen today for ongoing prenatal care.  She is currently monitored for the following issues for this low-risk pregnancy and has Migraine; Family history of cleft palate with cleft lip; Supervision of other normal pregnancy, antepartum; Previous cesarean delivery affecting pregnancy; Umbilical hernia; Mild intermittent asthma without complication; Vitamin D deficiency; Morbid obesity ([place]); Other fatigue; Breast lump on right side at 6 o'clock position; Menorrhagia with regular cycle; AMA (advanced maternal age) multigravida 35+; and Maternal obesity affecting pregnancy, antepartum on their problem list.  Patient reports occasional heart palpitations, none currently. Had this last month and went to [facility], noted to be severely hypokalemia.  Wants recheck of potassium and electrolytes today. No CP, SOB or other concerning symptoms.  Contractions: Not present. Vag. Bleeding: None.  Movement: Present. Denies leaking of fluid.   The following portions of the patient's history were reviewed and updated as appropriate: allergies, current medications, past family history, past medical history, past social history, past surgical history and problem list.   Objective:   Vitals:   03/10/20 0837  BP: 113/71  Pulse: 85  Weight: 194 lb (88 kg)    Fetal Status: Fetal Heart Rate (bpm): 149 Fundal Height: 32 cm Movement: Present     General:  Alert, oriented and cooperative. Patient is in no acute distress.  Skin: Skin is warm and dry. No rash noted.   Cardiovascular: Normal heart rate noted  Respiratory: Normal respiratory effort, no problems with respiration noted  Abdomen: Soft, gravid, appropriate for gestational age.  Pain/Pressure: Absent     Pelvic: Cervical exam deferred        Extremities: Normal range of motion.  Edema: None  Mental Status: Normal mood and affect. Normal behavior. Normal judgment and  thought content.   Assessment and Plan:  Pregnancy: [id number] at [redacted]w[redacted]d 1. Significant discrepancy between uterine size and clinical dates, antepartum, third trimester Elevated fundal height, growth scan ordered.  Will follow up results and manage accordingly. - [place] MFM OB FOLLOW UP; Future  2. History of hypokalemia - Comprehensive metabolic panel ordered, will follow up results and manage accordingly.  3. Previous cesarean delivery affecting pregnancy Message sent to surgical scheduler for RCS and BTL (has private insurance).  4. [redacted] weeks gestation of pregnancy 5. Supervision of other normal pregnancy, antepartum Third trimester labs today.  Getting Tdap today, already had Flu vaccine. Declines COVID vaccine.  - Glucose Tolerance, 2 Hours w/1 Hour - CBC - RPR - HIV Antibody (routine testing w rflx) Preterm labor symptoms and general obstetric precautions including but not limited to vaginal bleeding, contractions, leaking of fluid and fetal movement were reviewed in detail with the patient. Please refer to After Visit Summary for other counseling recommendations.   Return in about 3 weeks (around 03/31/2020) for OFFICE OB VISIT (MD or APP).  Future Appointments  Date Time Provider [place]  03/23/2020  8:15 AM [facility] NURSE [facility]  03/23/2020  8:30 AM [facility] US3 [facility]  03/31/2020  8:15 AM Tyreonna Czaplicki, Sallyanne Havers, MD [facility]  04/14/2020  8:15 AM Aletha Halim, MD [facility]  04/28/2020  8:15 AM Sonam Huelsmann, Sallyanne Havers, MD [facility]    Verita Schneiders, MD

## 2020-03-11 LAB — COMPREHENSIVE METABOLIC PANEL
ALT: 15 IU/L (ref 0–32)
AST: 13 IU/L (ref 0–40)
Albumin/Globulin Ratio: 1.6 (ref 1.2–2.2)
Albumin: 3.6 g/dL — ABNORMAL LOW (ref 3.8–4.8)
Alkaline Phosphatase: 70 IU/L (ref 44–121)
BUN/Creatinine Ratio: 12 (ref 9–23)
BUN: 7 mg/dL (ref 6–20)
Bilirubin Total: <0.2 mg/dL (ref 0.0–1.2)
CO2: 23 mmol/L (ref 20–29)
Calcium: 8.6 mg/dL — ABNORMAL LOW (ref 8.7–10.2)
Chloride: 103 mmol/L (ref 96–106)
Creatinine, Ser: 0.57 mg/dL (ref 0.57–1.00)
GFR calc Af Amer: 138 mL/min/{1.73_m2} (ref >59)
GFR calc non Af Amer: 120 mL/min/{1.73_m2} (ref >59)
Globulin, Total: 2.3 g/dL (ref 1.5–4.5)
Glucose: 177 mg/dL — ABNORMAL HIGH (ref 65–99)
Potassium: 3.4 mmol/L — ABNORMAL LOW (ref 3.5–5.2)
Sodium: 140 mmol/L (ref 134–144)
Total Protein: 5.9 g/dL — ABNORMAL LOW (ref 6.0–8.5)

## 2020-03-11 LAB — GLUCOSE TOLERANCE, 2 HOURS W/ 1HR
Glucose, 1 hour: 180 mg/dL — ABNORMAL HIGH (ref 65–179)
Glucose, 2 hour: 134 mg/dL (ref 65–152)
Glucose, Fasting: 77 mg/dL (ref 65–91)

## 2020-03-11 LAB — CBC
Hematocrit: 31.6 % — ABNORMAL LOW (ref 34.0–46.6)
Hemoglobin: 10.5 g/dL — ABNORMAL LOW (ref 11.1–15.9)
MCH: 28.5 pg (ref 26.6–33.0)
MCHC: 33.2 g/dL (ref 31.5–35.7)
MCV: 86 fL (ref 79–97)
Platelets: 332 10*3/uL (ref 150–450)
RBC: 3.68 x10E6/uL — ABNORMAL LOW (ref 3.77–5.28)
RDW: 12.7 % (ref 11.7–15.4)
WBC: 9.2 10*3/uL (ref 3.4–10.8)

## 2020-03-11 LAB — HIV ANTIBODY (ROUTINE TESTING W REFLEX): HIV Screen 4th Generation wRfx: NONREACTIVE

## 2020-03-11 LAB — RPR: RPR Ser Ql: NONREACTIVE

## 2020-03-12 ENCOUNTER — Telehealth: Payer: Self-pay | Admitting: *Deleted

## 2020-03-12 DIAGNOSIS — O24419 Gestational diabetes mellitus in pregnancy, unspecified control: Secondary | ICD-10-CM | POA: Insufficient documentation

## 2020-03-12 DIAGNOSIS — D509 Iron deficiency anemia, unspecified: Secondary | ICD-10-CM | POA: Insufficient documentation

## 2020-03-12 HISTORY — DX: Gestational diabetes mellitus in pregnancy, unspecified control: O24.419

## 2020-03-12 MED ORDER — ACCU-CHEK GUIDE VI STRP: ORAL_STRIP | 12 refills | Status: DC

## 2020-03-12 MED ORDER — ACCU-CHEK SOFTCLIX LANCETS MISC: 1.0000 | Freq: Four times a day (QID) | 12 refills | Status: DC

## 2020-03-12 MED ORDER — POLYSACCHARIDE IRON COMPLEX 150 MG PO CAPS: 150.0000 mg | ORAL_CAPSULE | ORAL | 3 refills | Status: DC

## 2020-03-12 MED ORDER — ACCU-CHEK GUIDE W/DEVICE KIT: 1.0000 | PACK | Freq: Four times a day (QID) | 0 refills | Status: DC

## 2020-03-12 NOTE — Telephone Encounter (Signed)
Pt informed of lab results and referral sent to diabetes and nutrition and will send in supplies into pharmacy. Once pt has class then she can start to check her blood sugars and to keep track of them on a paper log and to call if she has any questions to call the office on Monday. Pt verbalizes and understands.

## 2020-03-12 NOTE — Telephone Encounter (Signed)
-----   Message from Osborne Oman, MD sent at 03/12/2020  1:28 PM EST ----- Patient's 2 hr GTT is abnormal and is consistent with Gestational Diabetes [ ]  GDM education and testing supplies [ ]  Nutrition consult [ ]  Growth scan around time of diagnosis, if indicated (already scheduled 1/3/20211) [ ]  Enroll in [employer] [facility] Program Please call to inform patient of results and recommendations.

## 2020-03-12 NOTE — Addendum Note (Signed)
Addended by: Verita Schneiders A on: 03/12/2020 01:29 PM   Modules accepted: Orders

## 2020-03-23 ENCOUNTER — Other Ambulatory Visit: Payer: Self-pay

## 2020-03-23 ENCOUNTER — Encounter: Payer: BLUE CROSS/BLUE SHIELD | Attending: Obstetrics & Gynecology | Admitting: *Deleted

## 2020-03-23 ENCOUNTER — Encounter: Payer: Self-pay | Admitting: *Deleted

## 2020-03-23 ENCOUNTER — Ambulatory Visit: Payer: BLUE CROSS/BLUE SHIELD | Attending: Obstetrics & Gynecology

## 2020-03-23 ENCOUNTER — Other Ambulatory Visit: Payer: Self-pay | Admitting: *Deleted

## 2020-03-23 ENCOUNTER — Ambulatory Visit: Payer: BLUE CROSS/BLUE SHIELD | Admitting: *Deleted

## 2020-03-23 VITALS — BP 94/64 | Ht 63.0 in | Wt 194.9 lb

## 2020-03-23 DIAGNOSIS — Z8279 Family history of other congenital malformations, deformations and chromosomal abnormalities: Secondary | ICD-10-CM

## 2020-03-23 DIAGNOSIS — O24410 Gestational diabetes mellitus in pregnancy, diet controlled: Secondary | ICD-10-CM

## 2020-03-23 DIAGNOSIS — O34219 Maternal care for unspecified type scar from previous cesarean delivery: Secondary | ICD-10-CM

## 2020-03-23 DIAGNOSIS — O09522 Supervision of elderly multigravida, second trimester: Secondary | ICD-10-CM

## 2020-03-23 DIAGNOSIS — O99210 Obesity complicating pregnancy, unspecified trimester: Secondary | ICD-10-CM | POA: Diagnosis present

## 2020-03-23 DIAGNOSIS — O26843 Uterine size-date discrepancy, third trimester: Secondary | ICD-10-CM | POA: Diagnosis present

## 2020-03-23 DIAGNOSIS — O24419 Gestational diabetes mellitus in pregnancy, unspecified control: Secondary | ICD-10-CM | POA: Diagnosis present

## 2020-03-23 DIAGNOSIS — Z3A29 29 weeks gestation of pregnancy: Secondary | ICD-10-CM

## 2020-03-23 NOTE — Patient Instructions (Signed)
Read booklet on Gestational Diabetes Follow Gestational Meal Planning Guidelines Avoid sugar sweetened drinks (soda) Limit desserts/sweets Complete a 3 Day Food Record and bring to next appointment Check blood sugars 4 x day - before breakfast and 2 hrs after every meal and record  Bring blood sugar log to all appointments Purchase urine ketone strips if instructed by MD and check urine ketones every am:  If + increase bedtime snack to 1 protein and 2 carbohydrate servings Walk 20-30 minutes at least 5 x week if permitted by MD

## 2020-03-23 NOTE — Progress Notes (Signed)
Diabetes Self-Management Education  Visit Type: First/Initial  Appt. Start Time: 1330 Appt. End Time: 1500  03/23/2020  [name], identified by name and date of birth, is a 35 y.o. female with a diagnosis of Diabetes: Gestational Diabetes.   ASSESSMENT  Blood pressure 94/64, height 5\' 3"  (1.6 m), weight 194 lb 14.4 oz (88.4 kg), last menstrual period 08/30/2019, estimated date of delivery 06/05/2020 Body mass index is 34.52 kg/m.   Diabetes Self-Management Education - 03/23/20 1632      Visit Information   Visit Type First/Initial      Initial Visit   Diabetes Type Gestational Diabetes    Are you currently following a meal plan? No    Are you taking your medications as prescribed? Yes    Date Diagnosed 2 weeks ago      Health Coping   How would you rate your overall health? Good      Psychosocial Assessment   Patient Belief/Attitude about Diabetes Other (comment)   "disappointed"   Self-care barriers None    Self-management support Doctor's office    Patient Concerns Nutrition/Meal planning;Monitoring    Special Needs None    Preferred Learning Style Visual    Learning Readiness Ready    How often do you need to have someone help you when you read instructions, pamphlets, or other written materials from your doctor or pharmacy? 1 - Never    What is the last grade level you completed in school? master's      Pre-Education Assessment   Patient understands the diabetes disease and treatment process. Needs Instruction    Patient understands incorporating nutritional management into lifestyle. Needs Instruction    Patient undertands incorporating physical activity into lifestyle. Needs Instruction    Patient understands using medications safely. Needs Instruction    Patient understands monitoring blood glucose, interpreting and using results Needs Instruction    Patient understands prevention, detection, and treatment of acute complications. Needs Instruction     Patient understands prevention, detection, and treatment of chronic complications. Needs Instruction    Patient understands how to develop strategies to address psychosocial issues. Needs Instruction    Patient understands how to develop strategies to promote health/change behavior. Needs Instruction      Complications   Last HgB A1C per patient/outside source 5.4 %   11/19/2019   How often do you check your blood sugar? 0 times/day (not testing)   Pt brought her meter from home and was instructed on use. BG upon return demonstration was 121 mg/dL at 2:50 pm - 2 hrs pp.   Have you had a dilated eye exam in the past 12 months? No    Have you had a dental exam in the past 12 months? Yes    Are you checking your feet? No      Dietary Intake   Breakfast egg and cheese sandwich; omelet    Lunch [place] and cheese sandwich; baked potato; chicken sandwich from [employer] with fries    Snack (afternoon) crackers    Dinner chicken, beef, occasional pork; potatoes, beans, peas, corn, occasional rice and pasta; salad with lettuce, onion, tomatoes, cuccumbers    Snack (evening) cheese    Beverage(s) water, regular soda      Exercise   Exercise Type ADL's      Patient Education   Previous Diabetes Education No    Disease state  Definition of diabetes, type 1 and 2, and the diagnosis of diabetes;Factors that contribute to the development of  diabetes    Nutrition management  Role of diet in the treatment of diabetes and the relationship between the three main macronutrients and blood glucose level;Food label reading, portion sizes and measuring food.;Reviewed blood glucose goals for pre and post meals and how to evaluate the patients' food intake on their blood glucose level.    Physical activity and exercise  Role of exercise on diabetes management, blood pressure control and cardiac health.    Medications Other (comment)   Limited use of oral medications during pregnancy and potential for insulin.    Monitoring Taught/evaluated SMBG meter.;Purpose and frequency of SMBG.;Taught/discussed recording of test results and interpretation of SMBG.;Identified appropriate SMBG and/or A1C goals.;Ketone testing, when, how.    Chronic complications Relationship between chronic complications and blood glucose control    Psychosocial adjustment Identified and addressed patients feelings and concerns about diabetes    Preconception care Pregnancy and GDM  Role of pre-pregnancy blood glucose control on the development of the fetus;Reviewed with patient blood glucose goals with pregnancy;Role of family planning for patients with diabetes      Individualized Goals (developed by patient)   Reducing Risk Other (comment)   prevent diabetes complications     Outcomes   Expected Outcomes Demonstrated interest in learning. Expect positive outcomes           Individualized Plan for Diabetes Self-Management Training:   Learning Objective:  Patient will have a greater understanding of diabetes self-management. Patient education plan is to attend individual and/or group sessions per assessed needs and concerns.   Plan:   Patient Instructions  Read booklet on Gestational Diabetes Follow Gestational Meal Planning Guidelines Avoid sugar sweetened drinks (soda) Limit desserts/sweets Complete a 3 Day Food Record and bring to next appointment Check blood sugars 4 x day - before breakfast and 2 hrs after every meal and record  Bring blood sugar log to all appointments Purchase urine ketone strips if instructed by MD and check urine ketones every am:  If + increase bedtime snack to 1 protein and 2 carbohydrate servings Walk 20-30 minutes at least 5 x week if permitted by MD  Expected Outcomes:  Demonstrated interest in learning. Expect positive outcomes  Education material provided:  Gestational Booklet Gestational Meal Planning Guidelines Simple Meal Plan Viewed Gestational Diabetes Video 3 Day Food  Record Goals for a Healthy Pregnancy  If problems or questions, patient to contact team via:  Sharion Settler, RN, CCM, CDCES [phone]  Future DSME appointment:  March 30, 2020 with the dietitian

## 2020-03-24 ENCOUNTER — Encounter: Payer: BLUE CROSS/BLUE SHIELD | Admitting: Family Medicine

## 2020-03-30 ENCOUNTER — Ambulatory Visit: Payer: BLUE CROSS/BLUE SHIELD | Admitting: Dietician

## 2020-03-31 ENCOUNTER — Other Ambulatory Visit: Payer: Self-pay

## 2020-03-31 ENCOUNTER — Telehealth (INDEPENDENT_AMBULATORY_CARE_PROVIDER_SITE_OTHER): Payer: BLUE CROSS/BLUE SHIELD | Admitting: Obstetrics & Gynecology

## 2020-03-31 VITALS — BP 120/79 | HR 81 | Wt 194.2 lb

## 2020-03-31 DIAGNOSIS — Z3A30 30 weeks gestation of pregnancy: Secondary | ICD-10-CM

## 2020-03-31 DIAGNOSIS — O34219 Maternal care for unspecified type scar from previous cesarean delivery: Secondary | ICD-10-CM

## 2020-03-31 DIAGNOSIS — O24410 Gestational diabetes mellitus in pregnancy, diet controlled: Secondary | ICD-10-CM

## 2020-03-31 DIAGNOSIS — O0993 Supervision of high risk pregnancy, unspecified, third trimester: Secondary | ICD-10-CM

## 2020-03-31 NOTE — Progress Notes (Signed)
OBSTETRICS PRENATAL VIRTUAL VISIT ENCOUNTER NOTE  Provider location: [facility] [place] [facility]   I connected with Cindy Walker on 03/31/20 at  8:15 AM EST by MyChart Video Encounter at home and verified that I am speaking with the correct person using two identifiers.   I discussed the limitations, risks, security and privacy concerns of performing an evaluation and management service virtually and the availability of in person appointments. I also discussed with the patient that there may be a patient responsible charge related to this service. The patient expressed understanding and agreed to proceed. Subjective:  Cindy Walker is a 35 y.o. [phone] at [redacted]w[redacted]d being seen today for ongoing prenatal care.  She is currently monitored for the following issues for this high-risk pregnancy and has Migraine; Family history of cleft palate with cleft lip; Supervision of high-risk pregnancy, third trimester; Previous cesarean delivery affecting pregnancy; Umbilical hernia; Mild intermittent asthma without complication; Vitamin D deficiency; Morbid obesity ([place]); Other fatigue; Breast lump on right side at 6 o'clock position; Menorrhagia with regular cycle; AMA (advanced maternal age) multigravida 12+; Maternal obesity affecting pregnancy, antepartum; Gestational diabetes mellitus (GDM) in third trimester; and Maternal iron deficiency anemia complicating pregnancy, third trimester on their problem list.  Patient reports no complaints.  Contractions: Not present. Vag. Bleeding: None.  Movement: Present. Denies any leaking of fluid.   The following portions of the patient's history were reviewed and updated as appropriate: allergies, current medications, past family history, past medical history, past social history, past surgical history and problem list.   Objective:   Vitals:   03/31/20 0806  BP: 120/79  Pulse: 81  Weight: 194 lb 3.2 oz (88.1 kg)    Fetal Status:      Movement: Present     General:  Alert, oriented and cooperative. Patient is in no acute distress.  Respiratory: Normal respiratory effort, no problems with respiration noted  Mental Status: Normal mood and affect. Normal behavior. Normal judgment and thought content.  Rest of physical exam deferred due to type of encounter  Imaging: [place] MFM OB FOLLOW UP  Result Date: 03/23/2020 ----------------------------------------------------------------------  OBSTETRICS REPORT                       (Signed Final 03/23/2020 09:43 am) ---------------------------------------------------------------------- Patient Info  ID #:       [id number]                          D.O.B.:  06-07-1985 (35 yrs)  Name:       Cindy Walker            Visit Date: 03/23/2020 08:28 am ---------------------------------------------------------------------- Performed By  Attending:        Johnell Comings MD         Ref. Address:     Faculty  Performed By:     Wilnette Kales        [facility]  Referred By:      Mora Bellman MD ---------------------------------------------------------------------- Orders  #  Description                           Code        Ordered By  1  [place] MFM OB FOLLOW UP                   [id number]    Verita Schneiders ----------------------------------------------------------------------  #  Order #                     Accession #                Episode #  1  [id number]                   [id number]                 [id number] ---------------------------------------------------------------------- Indications  Advanced maternal age multigravida 62+,        O56.522  second trimester  Previous cesarean delivery, antepartum         O34.219  Family history of cleft lip and palate          Z82.79  [redacted] weeks gestation of pregnancy                Z3A.29 ---------------------------------------------------------------------- Fetal Evaluation  Num Of Fetuses:         1  Fetal Heart Rate(bpm):  152  Cardiac Activity:       Observed  Presentation:           Cephalic  Placenta:               Posterior  P. Cord Insertion:      Previously Visualized  Amniotic Fluid  AFI FV:      Within normal limits  AFI Sum(cm)     %Tile       Largest Pocket(cm)  11.14           22          3.54  RUQ(cm)       RLQ(cm)       LUQ(cm)        LLQ(cm)  2.81          1.87          3.54           2.92 ---------------------------------------------------------------------- Biometry  BPD:      71.3  mm     G. Age:  28w 4d         16  %    CI:        68.82   %    70 - 86  FL/HC:      21.3   %    19.6 - 20.8  HC:      274.6  mm     G. Age:  30w 0d         32  %    HC/AC:      1.05        0.99 - 1.21  AC:      262.3  mm     G. Age:  30w 3d         72  %    FL/BPD:     81.9   %    71 - 87  FL:       58.4  mm     G. Age:  30w 4d         67  %    FL/AC:      22.3   %    20 - 24  Est. FW:    1536  gm      3 lb 6 oz     67  % ---------------------------------------------------------------------- Gestational Age  LMP:           29w 3d        Date:  08/30/19                 EDD:   06/05/20  U/S Today:     29w 6d                                        EDD:   06/02/20  Best:          29w 3d     Det. By:  LMP  (08/30/19)          EDD:   06/05/20 ---------------------------------------------------------------------- Anatomy  Cranium:               Appears normal         Aortic Arch:            Previously seen  Cavum:                 Appears normal         Ductal Arch:            Previously seen  Ventricles:            Appears normal         Diaphragm:              Previously seen  Choroid Plexus:        Previously seen        Stomach:                Appears normal, left                                                                         sided  Cerebellum:            Appears normal         Abdomen:                Previously  seen  Posterior Fossa:       Appears normal         Abdominal Wall:         Appears nml (cord                                                                        insert, abd wall)  Nuchal Fold:           Previously seen        Cord Vessels:           Appears normal (3                                                                        vessel cord)  Face:                  Profile appears        Kidneys:                Appear normal                         normal  Lips:                  Appears normal         Bladder:                Appears normal  Thoracic:              Appears normal         Spine:                  Previously seen  Heart:                 Appears normal         Upper Extremities:      Previously seen                         (4CH, axis, and                         situs)  RVOT:                  Previously seen        Lower Extremities:      Previously seen  LVOT:                  Appears normal  Other:  Fetus appears to be female. ---------------------------------------------------------------------- Comments  This patient was seen for a follow up growth scan as her  fundal heights have been measuring larger than her  gestational age.  She was recently diagnosed with diet-  controlled gestational diabetes.  She is scheduled for diabetic  teaching later this afternoon.  She was informed that the fetal growth and amniotic fluid  level appears appropriate for  her gestational age.  The implications and management of diabetes in pregnancy  was discussed in detail with the patient. She was advised that  our goals for her fingerstick values are fasting values of 90-95  or less and two-hour postprandials of 120 or less.  Should her  fingerstick values be above these values, she may have to be  started on insulin or metformin to help her achieve better  glycemic  control. The patient was advised that getting her  fingerstick values as close to these goals as possible would  provide her with the most optimal obstetrical outcome.  A follow up exam was scheduled in 4 weeks to assess the  fetal growth.  Weekly fetal testing should be started at around  32 weeks should she require insulin or Metformin for  treatment of her gestational diabetes. ----------------------------------------------------------------------                   Johnell Comings, MD Electronically Signed Final Report   03/23/2020 09:43 am ----------------------------------------------------------------------   Assessment and Plan:  Pregnancy: [id number] at [redacted]w[redacted]d 1. Diet controlled gestational diabetes mellitus (GDM) in third trimester Blood sugars are within range, logging on paper. No concerns. Continue diet control. Growth scans as per MFM.   2. Previous cesarean delivery affecting pregnancy RCS and BTL scheduled on 05/29/20.  3. [redacted] weeks gestation of pregnancy 4. Supervision of high-risk pregnancy, third trimester Preterm labor symptoms and general obstetric precautions including but not limited to vaginal bleeding, contractions, leaking of fluid and fetal movement were reviewed in detail with the patient. I discussed the assessment and treatment plan with the patient. The patient was provided an opportunity to ask questions and all were answered. The patient agreed with the plan and demonstrated an understanding of the instructions. The patient was advised to call back or seek an in-person office evaluation/go to MAU at [facility] for any urgent or concerning symptoms. Please refer to After Visit Summary for other counseling recommendations.   I provided 10 minutes of face-to-face time during this encounter.  Return in about 2 weeks (around 04/14/2020) for OB visits as scheduled.  Future Appointments  Date Time Provider [place]  04/14/2020  8:15 AM Aletha Halim,  MD [facility]  04/23/2020 10:00 AM [facility]  04/28/2020  8:15 AM Kaydence Baba, Sallyanne Havers, MD [facility]    Verita Schneiders, MD [facility] [place]

## 2020-03-31 NOTE — Patient Instructions (Signed)
Return to office for any scheduled appointments. Call the office or go to the [facility] [place] if:  You begin to have strong, frequent contractions  Your water breaks.  Sometimes it is a big gush of fluid, sometimes it is just a trickle that keeps getting your panties wet or running down your legs  You have vaginal bleeding.  It is normal to have a small amount of spotting if your cervix was checked.   You do not feel your baby moving like normal.  If you do not, get something to eat and drink and lay down and focus on feeling your baby move.   If your baby is still not moving like normal, you should call the office or go to MAU.  Any other obstetric concerns.   

## 2020-04-03 ENCOUNTER — Encounter: Payer: BLUE CROSS/BLUE SHIELD | Admitting: Dietician

## 2020-04-03 ENCOUNTER — Other Ambulatory Visit: Payer: Self-pay

## 2020-04-03 ENCOUNTER — Encounter: Payer: Self-pay | Admitting: Dietician

## 2020-04-03 VITALS — BP 110/68 | Ht 63.0 in | Wt 194.1 lb

## 2020-04-03 DIAGNOSIS — O24419 Gestational diabetes mellitus in pregnancy, unspecified control: Secondary | ICD-10-CM | POA: Diagnosis not present

## 2020-04-03 DIAGNOSIS — O24410 Gestational diabetes mellitus in pregnancy, diet controlled: Secondary | ICD-10-CM

## 2020-04-03 NOTE — Progress Notes (Signed)
.   Patient's BG record indicates fasting BGs ranging 82-94, and post-meal BGs ranging 68-121. Marland Kitchen Patient's food diary indicates patient is eating at regular intervals, and generally balanced meals. Blood sugars have been within goal ranges even with occasional higher carb intake. Avoiding large carb portions and sweets/ sugar-sweetened beverages will likely allow patient to keep BGs controlled. . Reviewed basic meal planning and provided balanced meal plan and sample menus. . Instructed patient on food safety, including avoidance of Listeriosis, and limiting mercury from fish (patient states she does not eat fish). . Discussed importance of maintaining healthy lifestyle habits to reduce risk of Type 2 DM. Marland Kitchen Advised patient to use any remaining testing supplies to test some BGs after delivery, and to have BG tested ideally annually. She is not planning on any additional pregnancies.

## 2020-04-03 NOTE — Patient Instructions (Signed)
   Continue with current eating pattern, allow flexibility with protein and vegetable portions to satisfy hunger.   Keep eating at regular intervals, every 2-4 hours during the day, to avoid low blood sugar and an empty stomach for too long which could lead to nausea.

## 2020-04-07 ENCOUNTER — Encounter: Payer: BLUE CROSS/BLUE SHIELD | Admitting: Obstetrics & Gynecology

## 2020-04-14 ENCOUNTER — Other Ambulatory Visit: Payer: Self-pay

## 2020-04-14 ENCOUNTER — Telehealth (INDEPENDENT_AMBULATORY_CARE_PROVIDER_SITE_OTHER): Payer: BLUE CROSS/BLUE SHIELD | Admitting: Obstetrics and Gynecology

## 2020-04-14 VITALS — BP 115/70

## 2020-04-14 DIAGNOSIS — O99213 Obesity complicating pregnancy, third trimester: Secondary | ICD-10-CM

## 2020-04-14 DIAGNOSIS — J4520 Mild intermittent asthma, uncomplicated: Secondary | ICD-10-CM

## 2020-04-14 DIAGNOSIS — O99210 Obesity complicating pregnancy, unspecified trimester: Secondary | ICD-10-CM

## 2020-04-14 DIAGNOSIS — O99513 Diseases of the respiratory system complicating pregnancy, third trimester: Secondary | ICD-10-CM

## 2020-04-14 DIAGNOSIS — O99013 Anemia complicating pregnancy, third trimester: Secondary | ICD-10-CM

## 2020-04-14 DIAGNOSIS — D509 Iron deficiency anemia, unspecified: Secondary | ICD-10-CM

## 2020-04-14 DIAGNOSIS — O09523 Supervision of elderly multigravida, third trimester: Secondary | ICD-10-CM

## 2020-04-14 DIAGNOSIS — Z3A32 32 weeks gestation of pregnancy: Secondary | ICD-10-CM

## 2020-04-14 DIAGNOSIS — E669 Obesity, unspecified: Secondary | ICD-10-CM

## 2020-04-14 DIAGNOSIS — O34219 Maternal care for unspecified type scar from previous cesarean delivery: Secondary | ICD-10-CM

## 2020-04-14 DIAGNOSIS — O24410 Gestational diabetes mellitus in pregnancy, diet controlled: Secondary | ICD-10-CM

## 2020-04-14 DIAGNOSIS — O0993 Supervision of high risk pregnancy, unspecified, third trimester: Secondary | ICD-10-CM

## 2020-04-14 NOTE — Progress Notes (Signed)
I connected with  Cindy Walker on 04/14/20 at  8:15 AM EST by telephone and verified that I am speaking with the correct person using two identifiers.   I discussed the limitations, risks, security and privacy concerns of performing an evaluation and management service by telephone and the availability of in person appointments. I also discussed with the patient that there may be a patient responsible charge related to this service. The patient expressed understanding and agreed to proceed.  Crosby Oyster, RN 04/14/2020  8:21 AM

## 2020-04-14 NOTE — Progress Notes (Signed)
TELEHEALTH VIRTUAL OBSTETRICS VISIT ENCOUNTER NOTE  Clinic: [facility]  I connected with Cindy Walker on 04/14/20 at  8:15 AM EST by telephone at home and verified that I am speaking with the correct person using two identifiers.   I discussed the limitations, risks, security and privacy concerns of performing an evaluation and management service by telephone and the availability of in person appointments. I also discussed with the patient that there may be a patient responsible charge related to this service. The patient expressed understanding and agreed to proceed.  Subjective:  Cindy Walker is a 35 y.o. [phone] at [redacted]w[redacted]d being followed for ongoing prenatal care.  She is currently monitored for the following issues for this high-risk pregnancy and has Migraine; Family history of cleft palate with cleft lip; Supervision of high-risk pregnancy, third trimester; Previous cesarean delivery affecting pregnancy; Umbilical hernia; Mild intermittent asthma without complication; Vitamin D deficiency; BMI 30.0-30.9,adult; Other fatigue; Breast lump on right side at 6 o'clock position; Menorrhagia with regular cycle; AMA (advanced maternal age) multigravida 49+; Maternal obesity affecting pregnancy, antepartum; Gestational diabetes mellitus (GDM) in third trimester; and Maternal iron deficiency anemia complicating pregnancy, third trimester on their problem list.  Patient reports no complaints. Reports fetal movement. Denies any contractions, bleeding or leaking of fluid.   The following portions of the patient's history were reviewed and updated as appropriate: allergies, current medications, past family history, past medical history, past social history, past surgical history and problem list.   Objective:   Vitals:   04/14/20 0821  BP: 115/70    Babyscripts Data Reviewed: not applicable  General:  Alert, oriented and cooperative.   Mental Status: Normal  mood and affect perceived. Normal judgment and thought content.  Rest of physical exam deferred due to type of encounter  Assessment and Plan:  Pregnancy: [id number] at [redacted]w[redacted]d 1. Maternal obesity affecting pregnancy, antepartum Weight stable and appropriate. 19lbs TWG this pregnancy  2. Supervision of high-risk pregnancy, third trimester Routine care. Set up for 3/11 rpt c/s and btl already  3. Previous cesarean delivery affecting pregnancy  4. Maternal iron deficiency anemia complicating pregnancy, third trimester Continue qday iron CBC Latest Ref Rng & Units 03/10/2020 01/22/2020 11/19/2019  WBC 3.4 - 10.8 x10E3/uL 9.2 15.0(H) 8.8  Hemoglobin 11.1 - 15.9 g/dL 10.5(L) 11.2(L) 13.1  Hematocrit 34.0 - 46.6 % 31.6(L) 33.8(L) 38.6  Platelets 150 - 450 x10E3/uL 332 255 296   5. Multigravida of advanced maternal age in third trimester No issues  6. Diet controlled gestational diabetes mellitus (GDM) in third trimester Checking fastings (mid 3s) and a few 2h PP during the day since they are so normal (80s-90s usually). Has rpt surveillance growthin two weeks  7. Mild intermittent asthma without complication No need for meds  Preterm labor symptoms and general obstetric precautions including but not limited to vaginal bleeding, contractions, leaking of fluid and fetal movement were reviewed in detail with the patient.  I discussed the assessment and treatment plan with the patient. The patient was provided an opportunity to ask questions and all were answered. The patient agreed with the plan and demonstrated an understanding of the instructions. The patient was advised to call back or seek an in-person office evaluation/go to MAU at [facility] for any urgent or concerning symptoms. Please refer to After Visit Summary for other counseling recommendations.   I provided 7 minutes of non-face-to-face time during this encounter. The visit was conducted via  MyChart-medicine  Future Appointments  Date Time Provider [place]  04/23/2020 10:00 AM [facility]  04/28/2020  8:15 AM Anyanwu, Sallyanne Havers, MD [facility]  05/12/2020  8:15 AM Aletha Halim, MD [facility]    Aletha Halim, MD [facility] [place]

## 2020-04-21 ENCOUNTER — Other Ambulatory Visit: Payer: Self-pay | Admitting: Obstetrics and Gynecology

## 2020-04-21 ENCOUNTER — Encounter: Payer: BLUE CROSS/BLUE SHIELD | Admitting: Family Medicine

## 2020-04-21 DIAGNOSIS — O09529 Supervision of elderly multigravida, unspecified trimester: Secondary | ICD-10-CM

## 2020-04-21 DIAGNOSIS — Z98891 History of uterine scar from previous surgery: Secondary | ICD-10-CM

## 2020-04-23 ENCOUNTER — Ambulatory Visit: Payer: BLUE CROSS/BLUE SHIELD | Attending: Obstetrics and Gynecology

## 2020-04-23 ENCOUNTER — Other Ambulatory Visit: Payer: Self-pay

## 2020-04-23 DIAGNOSIS — O09529 Supervision of elderly multigravida, unspecified trimester: Secondary | ICD-10-CM

## 2020-04-23 DIAGNOSIS — O34219 Maternal care for unspecified type scar from previous cesarean delivery: Secondary | ICD-10-CM | POA: Diagnosis not present

## 2020-04-23 DIAGNOSIS — Z98891 History of uterine scar from previous surgery: Secondary | ICD-10-CM

## 2020-04-23 DIAGNOSIS — Z3A33 33 weeks gestation of pregnancy: Secondary | ICD-10-CM | POA: Insufficient documentation

## 2020-04-23 DIAGNOSIS — O99210 Obesity complicating pregnancy, unspecified trimester: Secondary | ICD-10-CM

## 2020-04-23 DIAGNOSIS — O09523 Supervision of elderly multigravida, third trimester: Secondary | ICD-10-CM | POA: Diagnosis not present

## 2020-04-23 DIAGNOSIS — O24410 Gestational diabetes mellitus in pregnancy, diet controlled: Secondary | ICD-10-CM | POA: Diagnosis not present

## 2020-04-28 ENCOUNTER — Encounter: Payer: Self-pay | Admitting: Obstetrics & Gynecology

## 2020-04-28 ENCOUNTER — Other Ambulatory Visit: Payer: Self-pay

## 2020-04-28 ENCOUNTER — Ambulatory Visit (INDEPENDENT_AMBULATORY_CARE_PROVIDER_SITE_OTHER): Payer: BLUE CROSS/BLUE SHIELD | Admitting: Obstetrics & Gynecology

## 2020-04-28 VITALS — BP 113/74 | HR 85 | Wt 198.4 lb

## 2020-04-28 DIAGNOSIS — O0993 Supervision of high risk pregnancy, unspecified, third trimester: Secondary | ICD-10-CM

## 2020-04-28 DIAGNOSIS — O99210 Obesity complicating pregnancy, unspecified trimester: Secondary | ICD-10-CM

## 2020-04-28 DIAGNOSIS — Z3A34 34 weeks gestation of pregnancy: Secondary | ICD-10-CM

## 2020-04-28 DIAGNOSIS — O24410 Gestational diabetes mellitus in pregnancy, diet controlled: Secondary | ICD-10-CM

## 2020-04-28 DIAGNOSIS — O34219 Maternal care for unspecified type scar from previous cesarean delivery: Secondary | ICD-10-CM

## 2020-04-28 NOTE — Patient Instructions (Signed)
Return to office for any scheduled appointments. Call the office or go to the [facility] at [facility] [place] if:  You begin to have strong, frequent contractions  Your water breaks.  Sometimes it is a big gush of fluid, sometimes it is just a trickle that keeps getting your panties wet or running down your legs  You have vaginal bleeding.  It is normal to have a small amount of spotting if your cervix was checked.   You do not feel your baby moving like normal.  If you do not, get something to eat and drink and lay down and focus on feeling your baby move.   If your baby is still not moving like normal, you should call the office or go to MAU.  Any other obstetric concerns.   

## 2020-04-28 NOTE — Progress Notes (Signed)
PRENATAL VISIT NOTE  Subjective:  Cindy Walker is a 35 y.o. [phone] at [redacted]w[redacted]d being seen today for ongoing prenatal care.  She is currently monitored for the following issues for this high-risk pregnancy and has Migraine; Family history of cleft palate with cleft lip; Supervision of high-risk pregnancy, third trimester; Previous cesarean delivery affecting pregnancy; Umbilical hernia; Mild intermittent asthma without complication; Vitamin D deficiency; BMI 30.0-30.9,adult; Other fatigue; Breast lump on right side at 6 o'clock position; Menorrhagia with regular cycle; AMA (advanced maternal age) multigravida 82+; Maternal obesity affecting pregnancy, antepartum; Gestational diabetes mellitus (GDM) in third trimester; and Maternal iron deficiency anemia complicating pregnancy, third trimester on their problem list.  Patient reports no complaints.  Contractions: Irregular. Vag. Bleeding: None.  Movement: Present. Denies leaking of fluid.   The following portions of the patient's history were reviewed and updated as appropriate: allergies, current medications, past family history, past medical history, past social history, past surgical history and problem list.   Objective:   Vitals:   04/28/20 0817  BP: 113/74  Pulse: 85  Weight: 198 lb 6.4 oz (90 kg)    Fetal Status: Fetal Heart Rate (bpm): 138   Movement: Present     General:  Alert, oriented and cooperative. Patient is in no acute distress.  Skin: Skin is warm and dry. No rash noted.   Cardiovascular: Normal heart rate noted  Respiratory: Normal respiratory effort, no problems with respiration noted  Abdomen: Soft, gravid, appropriate for gestational age.  Pain/Pressure: Present     Pelvic: Cervical exam deferred        Extremities: Normal range of motion.     Mental Status: Normal mood and affect. Normal behavior. Normal judgment and thought content.    Imaging: [place] MFM FETAL BPP WO NON STRESS  Result Date:  04/23/2020 ----------------------------------------------------------------------  OBSTETRICS REPORT                       (Signed Final 04/23/2020 02:03 pm) ---------------------------------------------------------------------- Patient Info  ID #:       [id number]                          D.O.B.:  Dec 08, 1985 (34 yrs)  Name:       Cindy Walker            Visit Date: 04/23/2020 10:35 am ---------------------------------------------------------------------- Performed By  Attending:        Tama High MD        Ref. Address:     Faculty  Performed By:     Christena Deem RDMS        [facility]                                                             [place] By:  PEGGY                    CONSTANT MD ---------------------------------------------------------------------- Orders  #  Description                           Code        Ordered By  1  [place] MFM FETAL BPP WO NON               76819.01    [place]  2  [place] MFM OB FOLLOW UP                   78469.62    [place] ----------------------------------------------------------------------  #  Order #                     Accession #                Episode #  1  [id number]                   [id number]                 [id number]  2  [id number]                   [id number]                 [id number] ---------------------------------------------------------------------- Indications  [redacted] weeks gestation of pregnancy                Z3A.34  Advanced maternal age multigravida 56+,        O30.529  unspecified trimester  Gestational diabetes in pregnancy, diet        O24.410  controlled  Previous cesarean delivery, antepartum         O34.219 ---------------------------------------------------------------------- Fetal Evaluation  Num Of Fetuses:         1  Fetal Heart Rate(bpm):  176  Cardiac Activity:       Observed  Presentation:            Cephalic  AFI Sum(cm)     %Tile       Largest Pocket(cm)  10.4            22          3.6  RUQ(cm)       RLQ(cm)       LUQ(cm)        LLQ(cm)  2.3           3.6           1.5            3 ---------------------------------------------------------------------- Biophysical Evaluation  Amniotic F.V:   Within normal limits       F. Tone:        Observed  F. Movement:    Observed                   Score:          8/8  F. Breathing:   Observed ---------------------------------------------------------------------- Biometry  BPD:      82.8  mm     G. Age:  33w 2d         30  %    CI:        72.54   %    70 - 86  FL/HC:      22.2   %    19.4 - 21.8  HC:      309.2  mm     G. Age:  34w 4d         30  %    HC/AC:      1.02        0.96 - 1.11  AC:      304.6  mm     G. Age:  34w 3d         70  %    FL/BPD:     82.7   %    71 - 87  FL:       68.5  mm     G. Age:  35w 1d         75  %    FL/AC:      22.5   %    20 - 24  HUM:      60.1  mm     G. Age:  35w 0d         77  %  LV:        4.5  mm  Est. FW:    2451  gm      5 lb 6 oz     64  % ---------------------------------------------------------------------- Gestational Age  LMP:           33w 6d        Date:  08/30/19                 EDD:   06/05/20  U/S Today:     34w 3d                                        EDD:   06/01/20  Best:          33w 6d     Det. By:  LMP  (08/30/19)          EDD:   06/05/20 ---------------------------------------------------------------------- Anatomy  Stomach:               Appears normal, left   Bladder:                Appears normal                         sided  Kidneys:               Appear normal          Spine:                  Appears normal ---------------------------------------------------------------------- Impression  Gestational diabetes.  Reportedly well controlled on diet.  Blood pressure today at her office is 125/60 mmHg  Amniotic fluid is normal and good fetal activity is seen   .Antenatal testing is reassuring. BPP 8/8. Fetal growth is  appropriate for gestational age .  Placenta is posterior and  there is no evidence of previa or accreta spectrum.  We reassured the patient of the finding.  Obstetric history significant for 3 term cesarean deliveries ---------------------------------------------------------------------- Recommendations  -An appointment was made for her to return in 4 weeks for  fetal growth assessment.  -If the patient requires insulin or oral hypoglycemics, we  recommend weekly BPP till delivery. ----------------------------------------------------------------------  Tama High, MD Electronically Signed Final Report   04/23/2020 02:03 pm ----------------------------------------------------------------------  [place] MFM OB FOLLOW UP  Result Date: 04/23/2020 ----------------------------------------------------------------------  OBSTETRICS REPORT                       (Signed Final 04/23/2020 02:03 pm) ---------------------------------------------------------------------- Patient Info  ID #:       [id number]                          D.O.B.:  07/03/1985 (34 yrs)  Name:       Cindy Walker            Visit Date: 04/23/2020 10:35 am ---------------------------------------------------------------------- Performed By  Attending:        Tama High MD        Ref. Address:     Faculty  Performed By:     Christena Deem RDMS        [facility]                                                             [facility]  Referred By:      Vickii Chafe                    CONSTANT MD ---------------------------------------------------------------------- Orders  #  Description                           Code        Ordered By  1  [place] MFM FETAL BPP WO NON               76819.01    [place]  2  [place] MFM OB FOLLOW UP                   60109.32    [place]  ----------------------------------------------------------------------  #  Order #                     Accession #                Episode #  1  [id number]                   [id number]                 [id number]  2  [id number]                   [id number]                 [id number] ---------------------------------------------------------------------- Indications  [redacted] weeks gestation of pregnancy                Z3A.76  Advanced maternal age multigravida  35+,        O09.529  unspecified trimester  Gestational diabetes in pregnancy, diet        O24.410  controlled  Previous cesarean delivery, antepartum         O34.219 ---------------------------------------------------------------------- Fetal Evaluation  Num Of Fetuses:         1  Fetal Heart Rate(bpm):  176  Cardiac Activity:       Observed  Presentation:           Cephalic  AFI Sum(cm)     %Tile       Largest Pocket(cm)  10.4            22          3.6  RUQ(cm)       RLQ(cm)       LUQ(cm)        LLQ(cm)  2.3           3.6           1.5            3 ---------------------------------------------------------------------- Biophysical Evaluation  Amniotic F.V:   Within normal limits       F. Tone:        Observed  F. Movement:    Observed                   Score:          8/8  F. Breathing:   Observed ---------------------------------------------------------------------- Biometry  BPD:      82.8  mm     G. Age:  33w 2d         30  %    CI:        72.54   %    70 - 86                                                          FL/HC:      22.2   %    19.4 - 21.8  HC:      309.2  mm     G. Age:  34w 4d         30  %    HC/AC:      1.02        0.96 - 1.11  AC:      304.6  mm     G. Age:  34w 3d         70  %    FL/BPD:     82.7   %    71 - 87  FL:       68.5  mm     G. Age:  35w 1d         75  %    FL/AC:      22.5   %    20 - 24  HUM:      60.1  mm     G. Age:  35w 0d         77  %  LV:        4.5  mm  Est. FW:    2451  gm      5 lb 6 oz     64  %  ---------------------------------------------------------------------- Gestational  Age  LMP:           44w 6d        Date:  08/30/19                 EDD:   06/05/20  U/S Today:     34w 3d                                        EDD:   06/01/20  Best:          33w 6d     Det. By:  LMP  (08/30/19)          EDD:   06/05/20 ---------------------------------------------------------------------- Anatomy  Stomach:               Appears normal, left   Bladder:                Appears normal                         sided  Kidneys:               Appear normal          Spine:                  Appears normal ---------------------------------------------------------------------- Impression  Gestational diabetes.  Reportedly well controlled on diet.  Blood pressure today at her office is 125/60 mmHg  Amniotic fluid is normal and good fetal activity is seen  .Antenatal testing is reassuring. BPP 8/8. Fetal growth is  appropriate for gestational age .  Placenta is posterior and  there is no evidence of previa or accreta spectrum.  We reassured the patient of the finding.  Obstetric history significant for 3 term cesarean deliveries ---------------------------------------------------------------------- Recommendations  -An appointment was made for her to return in 4 weeks for  fetal growth assessment.  -If the patient requires insulin or oral hypoglycemics, we  recommend weekly BPP till delivery. ----------------------------------------------------------------------                  Tama High, MD Electronically Signed Final Report   04/23/2020 02:03 pm ----------------------------------------------------------------------   Assessment and Plan:  Pregnancy: [id number] at [redacted]w[redacted]d 1. Diet controlled gestational diabetes mellitus (GDM) in third trimester Stable CBGs. Continue diet control. Appropriate growth scan on 04/23/20, next one scheduled.  2. Maternal obesity affecting pregnancy, antepartum TWG 25 lbs so far  3. Previous  cesarean delivery affecting pregnancy Scheduled for 4th cesarean section and BTL on 05/29/20.  4. [redacted] weeks gestation of pregnancy 5. Supervision of high-risk pregnancy, third trimester Preterm labor symptoms and general obstetric precautions including but not limited to vaginal bleeding, contractions, leaking of fluid and fetal movement were reviewed in detail with the patient. Please refer to After Visit Summary for other counseling recommendations.   Return in about 2 weeks (around 05/12/2020) for Pelvic cultures, OFFICE OB VISIT (MD only).  Future Appointments  Date Time Provider [place]  05/12/2020  8:15 AM Aletha Halim, MD [facility]  05/19/2020 10:15 AM Harolyn Rutherford, Sallyanne Havers, MD [facility]  05/21/2020  9:00 AM [facility]  05/26/2020  8:45 AM Aletha Halim, MD [facility]    Verita Schneiders, MD

## 2020-05-12 ENCOUNTER — Inpatient Hospital Stay (HOSPITAL_COMMUNITY): Admit: 2020-05-12 | Payer: BLUE CROSS/BLUE SHIELD

## 2020-05-12 ENCOUNTER — Other Ambulatory Visit: Payer: Self-pay

## 2020-05-12 ENCOUNTER — Ambulatory Visit (INDEPENDENT_AMBULATORY_CARE_PROVIDER_SITE_OTHER): Payer: BLUE CROSS/BLUE SHIELD | Admitting: Obstetrics and Gynecology

## 2020-05-12 VITALS — BP 115/74 | HR 87 | Wt 199.0 lb

## 2020-05-12 DIAGNOSIS — O24410 Gestational diabetes mellitus in pregnancy, diet controlled: Secondary | ICD-10-CM

## 2020-05-12 DIAGNOSIS — O0993 Supervision of high risk pregnancy, unspecified, third trimester: Secondary | ICD-10-CM

## 2020-05-12 DIAGNOSIS — O09523 Supervision of elderly multigravida, third trimester: Secondary | ICD-10-CM

## 2020-05-12 DIAGNOSIS — O99210 Obesity complicating pregnancy, unspecified trimester: Secondary | ICD-10-CM

## 2020-05-12 DIAGNOSIS — Z3A36 36 weeks gestation of pregnancy: Secondary | ICD-10-CM

## 2020-05-12 DIAGNOSIS — O34219 Maternal care for unspecified type scar from previous cesarean delivery: Secondary | ICD-10-CM

## 2020-05-12 NOTE — Progress Notes (Signed)
Prenatal Visit Note Date: 05/12/2020 Clinic: [facility]  Subjective:  Cindy Walker is a 35 y.o. [id number] at [redacted]w[redacted]d being seen today for ongoing prenatal care.  She is currently monitored for the following issues for this high-risk pregnancy and has Migraine; Family history of cleft palate with cleft lip; Supervision of high-risk pregnancy, third trimester; Previous cesarean delivery affecting pregnancy; Umbilical hernia; Mild intermittent asthma without complication; Vitamin D deficiency; BMI 30.0-30.9,adult; Other fatigue; Breast lump on right side at 6 o'clock position; Menorrhagia with regular cycle; AMA (advanced maternal age) multigravida 3+; Maternal obesity affecting pregnancy, antepartum; Gestational diabetes mellitus (GDM) in third trimester; and Maternal iron deficiency anemia complicating pregnancy, third trimester on their problem list.  Patient reports no complaints.   Contractions: Irritability. Vag. Bleeding: None.  Movement: Present. Denies leaking of fluid.   The following portions of the patient's history were reviewed and updated as appropriate: allergies, current medications, past family history, past medical history, past social history, past surgical history and problem list. Problem list updated.  Objective:   Vitals:   05/12/20 0826  BP: 115/74  Pulse: 87  Weight: 199 lb (90.3 kg)    Fetal Status: Fetal Heart Rate (bpm): 154 Fundal Height: 37 cm Movement: Present  Presentation: Vertex  General:  Alert, oriented and cooperative. Patient is in no acute distress.  Skin: Skin is warm and dry. No rash noted.   Cardiovascular: Normal heart rate noted  Respiratory: Normal respiratory effort, no problems with respiration noted  Abdomen: Soft, gravid, appropriate for gestational age. Pain/Pressure: Present     Pelvic:  Cervical exam deferred        Extremities: Normal range of motion.  Edema: None  Mental Status: Normal mood and affect. Normal  behavior. Normal judgment and thought content.   Urinalysis:      Assessment and Plan:  Pregnancy: [id number] at [redacted]w[redacted]d  1. Supervision of high-risk pregnancy, third trimester Routine care. D/w her pros/cons of salpingectomy vs ligation  2. Previous cesarean delivery affecting pregnancy Scheduled for 3/11 repeat  3. Diet controlled gestational diabetes mellitus (GDM) in third trimester Gives normal cbg numbers 2/3 growth 64%, 2451gm, normal afi.   4. Multigravida of advanced maternal age in third trimester No issues  5. Maternal obesity affecting pregnancy, antepartum  Preterm labor symptoms and general obstetric precautions including but not limited to vaginal bleeding, contractions, leaking of fluid and fetal movement were reviewed in detail with the patient. Please refer to After Visit Summary for other counseling recommendations.  Return in about 1 week (around 05/19/2020) for in person or virtual, md or app.   Aletha Halim, MD

## 2020-05-12 NOTE — Addendum Note (Signed)
Addended by: Crosby Oyster on: 05/12/2020 09:03 AM   Modules accepted: Orders

## 2020-05-13 LAB — GC/CHLAMYDIA PROBE AMP (~~LOC~~) NOT AT ARMC
Chlamydia: NEGATIVE
Comment: NEGATIVE
Comment: NORMAL
Neisseria Gonorrhea: NEGATIVE

## 2020-05-14 LAB — STREP GP B NAA: Strep Gp B NAA: NEGATIVE

## 2020-05-18 ENCOUNTER — Encounter (HOSPITAL_COMMUNITY): Payer: Self-pay

## 2020-05-18 NOTE — Patient Instructions (Signed)
Cindy Walker  05/18/2020   Your procedure is scheduled on:  05/29/2020  Arrive at 10 at [employer] [facility] on [address] at [facility]  and [employer] [employer] [address]. You are invited to use the FREE valet parking or use the Visitor's parking deck.  Pick up the phone at the desk and dial [phone].  Call this number if you have problems the morning of surgery: [phone]  Remember:   Do not eat food:(After Midnight) Desps de medianoche.  Do not drink clear liquids: (After Midnight) Desps de medianoche.  Take these medicines the morning of surgery with A SIP OF WATER:  Bring your inhaler   Do not wear jewelry, make-up or nail polish.  Do not wear lotions, powders, or perfumes. Do not wear deodorant.  Do not shave 48 hours prior to surgery.  Do not bring valuables to the hospital.  [facility] is not   responsible for any belongings or valuables brought to the hospital.  Contacts, dentures or bridgework may not be worn into surgery.  Leave suitcase in the car. After surgery it may be brought to your room.  For patients admitted to the hospital, checkout time is 11:00 AM the day of              discharge.      Please read over the following fact sheets that you were given:     Preparing for Surgery

## 2020-05-19 ENCOUNTER — Other Ambulatory Visit: Payer: Self-pay

## 2020-05-19 ENCOUNTER — Ambulatory Visit (INDEPENDENT_AMBULATORY_CARE_PROVIDER_SITE_OTHER): Payer: BLUE CROSS/BLUE SHIELD | Admitting: Obstetrics & Gynecology

## 2020-05-19 ENCOUNTER — Other Ambulatory Visit: Payer: Self-pay | Admitting: Nurse Practitioner

## 2020-05-19 ENCOUNTER — Encounter: Payer: Self-pay | Admitting: Obstetrics & Gynecology

## 2020-05-19 VITALS — BP 127/79 | HR 97 | Wt 199.0 lb

## 2020-05-19 DIAGNOSIS — O24410 Gestational diabetes mellitus in pregnancy, diet controlled: Secondary | ICD-10-CM

## 2020-05-19 DIAGNOSIS — O09523 Supervision of elderly multigravida, third trimester: Secondary | ICD-10-CM

## 2020-05-19 DIAGNOSIS — O0993 Supervision of high risk pregnancy, unspecified, third trimester: Secondary | ICD-10-CM

## 2020-05-19 DIAGNOSIS — O34219 Maternal care for unspecified type scar from previous cesarean delivery: Secondary | ICD-10-CM

## 2020-05-19 DIAGNOSIS — O99213 Obesity complicating pregnancy, third trimester: Secondary | ICD-10-CM

## 2020-05-19 DIAGNOSIS — Z3A37 37 weeks gestation of pregnancy: Secondary | ICD-10-CM

## 2020-05-19 DIAGNOSIS — O24419 Gestational diabetes mellitus in pregnancy, unspecified control: Secondary | ICD-10-CM

## 2020-05-19 NOTE — Progress Notes (Signed)
   PRENATAL VISIT NOTE  Subjective:  Cindy Walker is a 35 y.o. [phone] at [redacted]w[redacted]d being seen today for ongoing prenatal care.  She is currently monitored for the following issues for this high-risk pregnancy and has Migraine; Family history of cleft palate with cleft lip; Supervision of high-risk pregnancy, third trimester; Previous cesarean delivery affecting pregnancy; Umbilical hernia; Mild intermittent asthma without complication; Vitamin D deficiency; BMI 30.0-30.9,adult; Breast lump on right side at 6 o'clock position; AMA (advanced maternal age) multigravida 35+; Maternal obesity affecting pregnancy, antepartum; Gestational diabetes mellitus (GDM) in third trimester; and Maternal iron deficiency anemia complicating pregnancy, third trimester on their problem list.  Patient reports rare contractions.  Contractions: Irregular. Vag. Bleeding: None.  Movement: Present. Denies leaking of fluid.   The following portions of the patient's history were reviewed and updated as appropriate: allergies, current medications, past family history, past medical history, past social history, past surgical history and problem list.   Objective:   Vitals:   05/19/20 1018  BP: 127/79  Pulse: 97  Weight: 199 lb (90.3 kg)    Fetal Status: Fetal Heart Rate (bpm): 152   Movement: Present     General:  Alert, oriented and cooperative. Patient is in no acute distress.  Skin: Skin is warm and dry. No rash noted.   Cardiovascular: Normal heart rate noted  Respiratory: Normal respiratory effort, no problems with respiration noted  Abdomen: Soft, gravid, appropriate for gestational age.  Pain/Pressure: Present     Pelvic: Cervical exam deferred        Extremities: Normal range of motion.  Edema: None  Mental Status: Normal mood and affect. Normal behavior. Normal judgment and thought content.   Assessment and Plan:  Pregnancy: [id number] at [redacted]w[redacted]d 1. Diet controlled gestational diabetes mellitus (GDM) in  third trimester BGs reviewed, within range. Growth scan scheduled on 05/21/20.  Delivery already scheduled at 39 weeks.  2. Multigravida of advanced maternal age in third trimester 3. Previous cesarean delivery affecting pregnancy 4. [redacted] weeks gestation of pregnancy 5. Supervision of high risk pregnancy in third trimester RCS and BTL scheduled on 3/11/222. Term labor symptoms and general obstetric precautions including but not limited to vaginal bleeding, contractions, leaking of fluid and fetal movement were reviewed in detail with the patient. Please refer to After Visit Summary for other counseling recommendations.   Return in about 1 week (around 05/26/2020) for OFFICE OB VISIT (MD only).  Future Appointments  Date Time Provider [place]  05/21/2020  9:00 AM [facility] [place]  05/26/2020  8:45 AM Aletha Halim, MD [facility]  05/27/2020  8:55 AM [facility] None  05/27/2020  9:30 AM [facility] PAT 1 [facility] None    Verita Schneiders, MD

## 2020-05-19 NOTE — Patient Instructions (Signed)
Return to office for any scheduled appointments. Call the office or go to the MAU at [facility] [place] if:  You begin to have strong, frequent contractions  Your water breaks.  Sometimes it is a big gush of fluid, sometimes it is just a trickle that keeps getting your panties wet or running down your legs  You have vaginal bleeding.  It is normal to have a small amount of spotting if your cervix was checked.   You do not feel your baby moving like normal.  If you do not, get something to eat and drink and lay down and focus on feeling your baby move.   If your baby is still not moving like normal, you should call the office or go to MAU.  Any other obstetric concerns.   

## 2020-05-21 ENCOUNTER — Other Ambulatory Visit: Payer: Self-pay | Admitting: Nurse Practitioner

## 2020-05-21 ENCOUNTER — Other Ambulatory Visit: Payer: Self-pay

## 2020-05-21 ENCOUNTER — Ambulatory Visit: Payer: BLUE CROSS/BLUE SHIELD | Attending: Obstetrics

## 2020-05-21 DIAGNOSIS — O99213 Obesity complicating pregnancy, third trimester: Secondary | ICD-10-CM

## 2020-05-21 DIAGNOSIS — O09523 Supervision of elderly multigravida, third trimester: Secondary | ICD-10-CM

## 2020-05-21 DIAGNOSIS — O99210 Obesity complicating pregnancy, unspecified trimester: Secondary | ICD-10-CM

## 2020-05-21 DIAGNOSIS — O34219 Maternal care for unspecified type scar from previous cesarean delivery: Secondary | ICD-10-CM | POA: Diagnosis not present

## 2020-05-21 DIAGNOSIS — Z3A37 37 weeks gestation of pregnancy: Secondary | ICD-10-CM | POA: Insufficient documentation

## 2020-05-21 DIAGNOSIS — O24419 Gestational diabetes mellitus in pregnancy, unspecified control: Secondary | ICD-10-CM

## 2020-05-21 DIAGNOSIS — O24410 Gestational diabetes mellitus in pregnancy, diet controlled: Secondary | ICD-10-CM | POA: Diagnosis present

## 2020-05-25 NOTE — Pre-Procedure Instructions (Addendum)
error 

## 2020-05-26 ENCOUNTER — Other Ambulatory Visit: Payer: Self-pay

## 2020-05-26 ENCOUNTER — Ambulatory Visit (INDEPENDENT_AMBULATORY_CARE_PROVIDER_SITE_OTHER): Payer: BLUE CROSS/BLUE SHIELD | Admitting: Obstetrics and Gynecology

## 2020-05-26 VITALS — BP 114/77 | HR 84 | Wt 198.2 lb

## 2020-05-26 DIAGNOSIS — D509 Iron deficiency anemia, unspecified: Secondary | ICD-10-CM

## 2020-05-26 DIAGNOSIS — O34219 Maternal care for unspecified type scar from previous cesarean delivery: Secondary | ICD-10-CM

## 2020-05-26 DIAGNOSIS — O99013 Anemia complicating pregnancy, third trimester: Secondary | ICD-10-CM

## 2020-05-26 DIAGNOSIS — O0993 Supervision of high risk pregnancy, unspecified, third trimester: Secondary | ICD-10-CM

## 2020-05-26 DIAGNOSIS — Z3A38 38 weeks gestation of pregnancy: Secondary | ICD-10-CM

## 2020-05-26 DIAGNOSIS — O09523 Supervision of elderly multigravida, third trimester: Secondary | ICD-10-CM

## 2020-05-26 DIAGNOSIS — O24410 Gestational diabetes mellitus in pregnancy, diet controlled: Secondary | ICD-10-CM

## 2020-05-26 NOTE — Progress Notes (Signed)
Prenatal Visit Note Date: 05/26/2020 Clinic: [facility]  Subjective:  Cindy Walker is a 35 y.o. [id number] at [redacted]w[redacted]d being seen today for ongoing prenatal care.  She is currently monitored for the following issues for this high-risk pregnancy and has Migraine; Family history of cleft palate with cleft lip; Supervision of high-risk pregnancy, third trimester; Previous cesarean delivery affecting pregnancy; Umbilical hernia; Mild intermittent asthma without complication; Vitamin D deficiency; BMI 30.0-30.9,adult; Breast lump on right side at 6 o'clock position; AMA (advanced maternal age) multigravida 35+; Maternal obesity affecting pregnancy, antepartum; Gestational diabetes mellitus (GDM) in third trimester; and Maternal iron deficiency anemia complicating pregnancy, third trimester on their problem list.  Patient reports occasional contractions.   Contractions: Irregular. Vag. Bleeding: None.  Movement: Present. Denies leaking of fluid.   The following portions of the patient's history were reviewed and updated as appropriate: allergies, current medications, past family history, past medical history, past social history, past surgical history and problem list. Problem list updated.  Objective:   Vitals:   05/26/20 0848  BP: 114/77  Pulse: 84  Weight: 198 lb 3.2 oz (89.9 kg)    Fetal Status: Fetal Heart Rate (bpm): 141   Movement: Present     General:  Alert, oriented and cooperative. Patient is in no acute distress.  Skin: Skin is warm and dry. No rash noted.   Cardiovascular: Normal heart rate noted  Respiratory: Normal respiratory effort, no problems with respiration noted  Abdomen: Soft, gravid, appropriate for gestational age. Pain/Pressure: Present     Pelvic:  Cervical exam deferred        Extremities: Normal range of motion.  Edema: None  Mental Status: Normal mood and affect. Normal behavior. Normal judgment and thought content.   Urinalysis:       Assessment and Plan:  Pregnancy: [id number] at [redacted]w[redacted]d  1. Previous cesarean delivery affecting pregnancy For rpt and BTL on 3/11  2. Supervision of high-risk pregnancy, third trimester  3. Maternal iron deficiency anemia complicating pregnancy, third trimester 10.5 at latest draw  4. Multigravida of advanced maternal age in third trimester No issues  5. Diet controlled gestational diabetes mellitus (GDM) in third trimester bpp 8/8 last week. Normal fasting, pt states she needs to check more 2h PP but none out of range  Term labor symptoms and general obstetric precautions including but not limited to vaginal bleeding, contractions, leaking of fluid and fetal movement were reviewed in detail with the patient. Please refer to After Visit Summary for other counseling recommendations.  Return in about 10 days (around 06/05/2020) for RN incision check .   Aletha Halim, MD

## 2020-05-27 ENCOUNTER — Other Ambulatory Visit
Admission: RE | Admit: 2020-05-27 | Discharge: 2020-05-27 | Disposition: A | Discharge status: Home / Self Care | Payer: BLUE CROSS/BLUE SHIELD | Source: Ambulatory Visit | Attending: Family Medicine | Admitting: Family Medicine

## 2020-05-27 ENCOUNTER — Encounter (HOSPITAL_COMMUNITY)
Admission: RE | Admit: 2020-05-27 | Discharge: 2020-05-27 | Disposition: A | Discharge status: Home / Self Care | Payer: BLUE CROSS/BLUE SHIELD | Source: Ambulatory Visit | Attending: Family Medicine | Admitting: Family Medicine

## 2020-05-27 ENCOUNTER — Other Ambulatory Visit (HOSPITAL_COMMUNITY): Payer: BLUE CROSS/BLUE SHIELD

## 2020-05-27 ENCOUNTER — Other Ambulatory Visit: Payer: Self-pay

## 2020-05-27 DIAGNOSIS — Z01812 Encounter for preprocedural laboratory examination: Secondary | ICD-10-CM | POA: Insufficient documentation

## 2020-05-27 DIAGNOSIS — Z20822 Contact with and (suspected) exposure to covid-19: Secondary | ICD-10-CM | POA: Insufficient documentation

## 2020-05-27 LAB — SARS CORONAVIRUS 2 (TAT 6-24 HRS): SARS Coronavirus 2: NEGATIVE

## 2020-05-27 LAB — CBC
HCT: 28.9 % — ABNORMAL LOW (ref 36.0–46.0)
Hemoglobin: 9 g/dL — ABNORMAL LOW (ref 12.0–15.0)
MCH: 24.9 pg — ABNORMAL LOW (ref 26.0–34.0)
MCHC: 31.1 g/dL (ref 30.0–36.0)
MCV: 80.1 fL (ref 80.0–100.0)
Platelets: 344 10*3/uL (ref 150–400)
RBC: 3.61 MIL/uL — ABNORMAL LOW (ref 3.87–5.11)
RDW: 14.7 % (ref 11.5–15.5)
WBC: 7.4 10*3/uL (ref 4.0–10.5)
nRBC: 0 % (ref 0.0–0.2)

## 2020-05-27 LAB — TYPE AND SCREEN
ABO/RH(D): A POS
Antibody Screen: NEGATIVE

## 2020-05-28 LAB — RPR: RPR Ser Ql: NONREACTIVE

## 2020-05-28 NOTE — Anesthesia Preprocedure Evaluation (Addendum)
Anesthesia Evaluation  Patient identified by MRN, date of birth, ID band Patient awake    Reviewed: Allergy & Precautions, NPO status , Patient's Chart, lab work & pertinent test results  Airway Mallampati: I  TM Distance: >3 FB Neck ROM: Full    Dental no notable dental hx. (+) Teeth Intact, Dental Advisory Given   Pulmonary asthma , former smoker,    Pulmonary exam normal breath sounds clear to auscultation       Cardiovascular negative cardio ROS Normal cardiovascular exam Rhythm:Regular Rate:Normal     Neuro/Psych  Headaches, negative psych ROS   GI/Hepatic negative GI ROS, Neg liver ROS,   Endo/Other  diabetes, GestationalMorbid obesity  Renal/GU negative Renal ROS  negative genitourinary   Musculoskeletal negative musculoskeletal ROS (+)   Abdominal (+) + obese,   Peds negative pediatric ROS (+)  Hematology  (+) Blood dyscrasia, anemia ,   Anesthesia Other Findings   Reproductive/Obstetrics (+) Pregnancy                            Anesthesia Physical  Anesthesia Plan  ASA: III  Anesthesia Plan: Spinal   Post-op Pain Management:    Induction:   PONV Risk Score and Plan: 2 and Scopolamine patch - Pre-op  Airway Management Planned: Nasal Cannula and Natural Airway  Additional Equipment: None  Intra-op Plan:   Post-operative Plan:   Informed Consent: I have reviewed the patients History and Physical, chart, labs and discussed the procedure including the risks, benefits and alternatives for the proposed anesthesia with the patient or authorized representative who has indicated his/her understanding and acceptance.       Plan Discussed with: CRNA and Anesthesiologist  Anesthesia Plan Comments:        Anesthesia Quick Evaluation

## 2020-05-29 ENCOUNTER — Inpatient Hospital Stay (HOSPITAL_COMMUNITY)
Admission: RE | Admit: 2020-05-29 | Discharge: 2020-05-31 | DRG: 785 | Disposition: A | Discharge status: Home / Self Care | Payer: BLUE CROSS/BLUE SHIELD | Attending: Family Medicine | Admitting: Family Medicine

## 2020-05-29 ENCOUNTER — Inpatient Hospital Stay (HOSPITAL_COMMUNITY): Payer: BLUE CROSS/BLUE SHIELD | Admitting: Anesthesiology

## 2020-05-29 ENCOUNTER — Encounter (HOSPITAL_COMMUNITY)
Admission: RE | Disposition: A | Discharge status: Home / Self Care | Payer: Self-pay | Source: Home / Self Care | Attending: Family Medicine

## 2020-05-29 ENCOUNTER — Encounter (HOSPITAL_COMMUNITY): Payer: Self-pay | Admitting: Family Medicine

## 2020-05-29 ENCOUNTER — Other Ambulatory Visit: Payer: Self-pay

## 2020-05-29 DIAGNOSIS — O24419 Gestational diabetes mellitus in pregnancy, unspecified control: Secondary | ICD-10-CM | POA: Diagnosis present

## 2020-05-29 DIAGNOSIS — O99013 Anemia complicating pregnancy, third trimester: Secondary | ICD-10-CM | POA: Diagnosis present

## 2020-05-29 DIAGNOSIS — Z3A38 38 weeks gestation of pregnancy: Secondary | ICD-10-CM

## 2020-05-29 DIAGNOSIS — O24420 Gestational diabetes mellitus in childbirth, diet controlled: Secondary | ICD-10-CM | POA: Diagnosis present

## 2020-05-29 DIAGNOSIS — O34211 Maternal care for low transverse scar from previous cesarean delivery: Secondary | ICD-10-CM

## 2020-05-29 DIAGNOSIS — O09529 Supervision of elderly multigravida, unspecified trimester: Secondary | ICD-10-CM

## 2020-05-29 DIAGNOSIS — D509 Iron deficiency anemia, unspecified: Secondary | ICD-10-CM | POA: Diagnosis present

## 2020-05-29 DIAGNOSIS — Z302 Encounter for sterilization: Secondary | ICD-10-CM

## 2020-05-29 DIAGNOSIS — Z87891 Personal history of nicotine dependence: Secondary | ICD-10-CM | POA: Diagnosis not present

## 2020-05-29 DIAGNOSIS — Z6830 Body mass index (BMI) 30.0-30.9, adult: Secondary | ICD-10-CM

## 2020-05-29 DIAGNOSIS — O34219 Maternal care for unspecified type scar from previous cesarean delivery: Secondary | ICD-10-CM | POA: Diagnosis present

## 2020-05-29 DIAGNOSIS — J45909 Unspecified asthma, uncomplicated: Secondary | ICD-10-CM | POA: Diagnosis present

## 2020-05-29 DIAGNOSIS — Z98891 History of uterine scar from previous surgery: Secondary | ICD-10-CM

## 2020-05-29 DIAGNOSIS — O9952 Diseases of the respiratory system complicating childbirth: Secondary | ICD-10-CM | POA: Diagnosis present

## 2020-05-29 DIAGNOSIS — O9902 Anemia complicating childbirth: Secondary | ICD-10-CM | POA: Diagnosis present

## 2020-05-29 DIAGNOSIS — O99214 Obesity complicating childbirth: Secondary | ICD-10-CM | POA: Diagnosis present

## 2020-05-29 DIAGNOSIS — Z6833 Body mass index (BMI) 33.0-33.9, adult: Secondary | ICD-10-CM

## 2020-05-29 DIAGNOSIS — O0993 Supervision of high risk pregnancy, unspecified, third trimester: Secondary | ICD-10-CM

## 2020-05-29 LAB — GLUCOSE, CAPILLARY
Glucose-Capillary: 123 mg/dL — ABNORMAL HIGH (ref 70–99)
Glucose-Capillary: 69 mg/dL — ABNORMAL LOW (ref 70–99)
Glucose-Capillary: 79 mg/dL (ref 70–99)

## 2020-05-29 LAB — CREATININE, SERUM
Creatinine, Ser: 0.51 mg/dL (ref 0.44–1.00)
GFR, Estimated: >60 mL/min (ref >60)

## 2020-05-29 SURGERY — Surgical Case
Anesthesia: Spinal

## 2020-05-29 MED ORDER — ENOXAPARIN SODIUM 40 MG/0.4ML ~~LOC~~ SOLN
40.0000 mg | SUBCUTANEOUS | Status: DC
  Administered 2020-05-30 – 2020-05-31 (×2): 40 mg via SUBCUTANEOUS
  Filled 2020-05-29 (×2): qty 0.4

## 2020-05-29 MED ORDER — OXYCODONE HCL 5 MG/5ML PO SOLN: 5.0000 mg | Freq: Once | ORAL | Status: DC | PRN

## 2020-05-29 MED ORDER — MENTHOL 3 MG MT LOZG: 1.0000 | LOZENGE | OROMUCOSAL | Status: DC | PRN

## 2020-05-29 MED ORDER — NALOXONE HCL 4 MG/10ML IJ SOLN
1.0000 ug/kg/h | INTRAMUSCULAR | Status: DC | PRN
  Filled 2020-05-29: qty 5

## 2020-05-29 MED ORDER — IBUPROFEN 600 MG PO TABS
600.0000 mg | ORAL_TABLET | Freq: Four times a day (QID) | ORAL | Status: DC
  Administered 2020-05-30 – 2020-05-31 (×4): 600 mg via ORAL
  Filled 2020-05-29 (×4): qty 1

## 2020-05-29 MED ORDER — ACETAMINOPHEN 500 MG PO TABS
1000.0000 mg | ORAL_TABLET | Freq: Three times a day (TID) | ORAL | Status: DC | PRN
  Administered 2020-05-29 – 2020-05-30 (×3): 1000 mg via ORAL
  Filled 2020-05-29 (×4): qty 2

## 2020-05-29 MED ORDER — KETOROLAC TROMETHAMINE 30 MG/ML IJ SOLN
30.0000 mg | Freq: Four times a day (QID) | INTRAMUSCULAR | Status: AC | PRN
  Administered 2020-05-29: 30 mg via INTRAMUSCULAR

## 2020-05-29 MED ORDER — CEFAZOLIN SODIUM-DEXTROSE 2-3 GM-%(50ML) IV SOLR
INTRAVENOUS | Status: DC | PRN
  Administered 2020-05-29: 2 g via INTRAVENOUS

## 2020-05-29 MED ORDER — LACTATED RINGERS IV SOLN: INTRAVENOUS | Status: DC | PRN

## 2020-05-29 MED ORDER — ZOLPIDEM TARTRATE 5 MG PO TABS: 5.0000 mg | ORAL_TABLET | Freq: Every evening | ORAL | Status: DC | PRN

## 2020-05-29 MED ORDER — BUPIVACAINE IN DEXTROSE 0.75-8.25 % IT SOLN
INTRATHECAL | Status: DC | PRN
  Administered 2020-05-29: 1.55 mL via INTRATHECAL

## 2020-05-29 MED ORDER — DIBUCAINE (PERIANAL) 1 % EX OINT: 1.0000 "application " | TOPICAL_OINTMENT | CUTANEOUS | Status: DC | PRN

## 2020-05-29 MED ORDER — DIPHENHYDRAMINE HCL 50 MG/ML IJ SOLN: 12.5000 mg | INTRAMUSCULAR | Status: DC | PRN

## 2020-05-29 MED ORDER — ACETAMINOPHEN 500 MG PO TABS
1000.0000 mg | ORAL_TABLET | ORAL | Status: AC
  Administered 2020-05-29: 1000 mg via ORAL

## 2020-05-29 MED ORDER — ONDANSETRON HCL 4 MG/2ML IJ SOLN: 4.0000 mg | Freq: Once | INTRAMUSCULAR | Status: DC | PRN

## 2020-05-29 MED ORDER — ACETAMINOPHEN 500 MG PO TABS
ORAL_TABLET | ORAL | Status: AC
  Filled 2020-05-29: qty 2

## 2020-05-29 MED ORDER — BUPIVACAINE HCL (PF) 0.25 % IJ SOLN
INTRAMUSCULAR | Status: DC | PRN
  Administered 2020-05-29: 30 mL

## 2020-05-29 MED ORDER — NALOXONE HCL 0.4 MG/ML IJ SOLN: 0.4000 mg | INTRAMUSCULAR | Status: DC | PRN

## 2020-05-29 MED ORDER — NALBUPHINE HCL 10 MG/ML IJ SOLN
5.0000 mg | INTRAMUSCULAR | Status: DC | PRN
Start: 2020-05-29 — End: 2020-05-31

## 2020-05-29 MED ORDER — SENNOSIDES-DOCUSATE SODIUM 8.6-50 MG PO TABS
2.0000 | ORAL_TABLET | Freq: Every day | ORAL | Status: DC
  Administered 2020-05-30: 2 via ORAL
  Filled 2020-05-29: qty 2

## 2020-05-29 MED ORDER — LACTATED RINGERS IV SOLN: INTRAVENOUS | Status: DC

## 2020-05-29 MED ORDER — ONDANSETRON HCL 4 MG/2ML IJ SOLN
INTRAMUSCULAR | Status: AC
  Filled 2020-05-29: qty 2

## 2020-05-29 MED ORDER — MORPHINE SULFATE (PF) 0.5 MG/ML IJ SOLN
INTRAMUSCULAR | Status: DC | PRN
  Administered 2020-05-29: 150 ug via INTRATHECAL

## 2020-05-29 MED ORDER — FENTANYL CITRATE (PF) 100 MCG/2ML IJ SOLN: 25.0000 ug | INTRAMUSCULAR | Status: DC | PRN

## 2020-05-29 MED ORDER — SOD CITRATE-CITRIC ACID 500-334 MG/5ML PO SOLN
ORAL | Status: AC
  Filled 2020-05-29: qty 30

## 2020-05-29 MED ORDER — TETANUS-DIPHTH-ACELL PERTUSSIS 5-2.5-18.5 LF-MCG/0.5 IM SUSY: 0.5000 mL | PREFILLED_SYRINGE | Freq: Once | INTRAMUSCULAR | Status: DC

## 2020-05-29 MED ORDER — MEPERIDINE HCL 25 MG/ML IJ SOLN: 6.2500 mg | INTRAMUSCULAR | Status: DC | PRN

## 2020-05-29 MED ORDER — ACETAMINOPHEN 325 MG PO TABS: 325.0000 mg | ORAL_TABLET | ORAL | Status: DC | PRN

## 2020-05-29 MED ORDER — FENTANYL CITRATE (PF) 100 MCG/2ML IJ SOLN
INTRAMUSCULAR | Status: AC
  Filled 2020-05-29: qty 2

## 2020-05-29 MED ORDER — OXYTOCIN-SODIUM CHLORIDE 30-0.9 UT/500ML-% IV SOLN: 2.5000 [IU]/h | INTRAVENOUS | Status: AC

## 2020-05-29 MED ORDER — KETOROLAC TROMETHAMINE 30 MG/ML IJ SOLN
INTRAMUSCULAR | Status: AC
  Filled 2020-05-29: qty 1

## 2020-05-29 MED ORDER — DEXTROSE 50 % IV SOLN
INTRAVENOUS | Status: AC
  Filled 2020-05-29: qty 50

## 2020-05-29 MED ORDER — GABAPENTIN 300 MG PO CAPS
300.0000 mg | ORAL_CAPSULE | ORAL | Status: AC
  Administered 2020-05-29: 300 mg via ORAL

## 2020-05-29 MED ORDER — HYDROMORPHONE HCL 1 MG/ML IJ SOLN: 0.2000 mg | INTRAMUSCULAR | Status: DC | PRN

## 2020-05-29 MED ORDER — OXYCODONE HCL 5 MG PO TABS
5.0000 mg | ORAL_TABLET | Freq: Once | ORAL | Status: DC | PRN
Start: 2020-05-29 — End: 2020-05-29

## 2020-05-29 MED ORDER — SCOPOLAMINE 1 MG/3DAYS TD PT72
1.0000 | MEDICATED_PATCH | TRANSDERMAL | Status: DC
  Administered 2020-05-29: 1.5 mg via TRANSDERMAL

## 2020-05-29 MED ORDER — WITCH HAZEL-GLYCERIN EX PADS: 1.0000 "application " | MEDICATED_PAD | CUTANEOUS | Status: DC | PRN

## 2020-05-29 MED ORDER — COCONUT OIL OIL: 1.0000 "application " | TOPICAL_OIL | Status: DC | PRN

## 2020-05-29 MED ORDER — PRENATAL MULTIVITAMIN CH
1.0000 | ORAL_TABLET | Freq: Every day | ORAL | Status: DC
  Administered 2020-05-30: 1 via ORAL
  Filled 2020-05-29: qty 1

## 2020-05-29 MED ORDER — CEFAZOLIN SODIUM-DEXTROSE 2-4 GM/100ML-% IV SOLN
INTRAVENOUS | Status: AC
  Filled 2020-05-29: qty 100

## 2020-05-29 MED ORDER — PHENYLEPHRINE HCL-NACL 20-0.9 MG/250ML-% IV SOLN
INTRAVENOUS | Status: AC
  Filled 2020-05-29: qty 250

## 2020-05-29 MED ORDER — ONDANSETRON HCL 4 MG/2ML IJ SOLN: 4.0000 mg | Freq: Three times a day (TID) | INTRAMUSCULAR | Status: DC | PRN

## 2020-05-29 MED ORDER — OXYTOCIN-SODIUM CHLORIDE 30-0.9 UT/500ML-% IV SOLN
INTRAVENOUS | Status: DC | PRN
  Administered 2020-05-29: 30 [IU] via INTRAVENOUS

## 2020-05-29 MED ORDER — NALBUPHINE HCL 10 MG/ML IJ SOLN: 5.0000 mg | Freq: Once | INTRAMUSCULAR | Status: DC | PRN

## 2020-05-29 MED ORDER — SIMETHICONE 80 MG PO CHEW: 80.0000 mg | CHEWABLE_TABLET | ORAL | Status: DC | PRN

## 2020-05-29 MED ORDER — MORPHINE SULFATE (PF) 0.5 MG/ML IJ SOLN
INTRAMUSCULAR | Status: AC
  Filled 2020-05-29: qty 10

## 2020-05-29 MED ORDER — DIPHENHYDRAMINE HCL 25 MG PO CAPS: 25.0000 mg | ORAL_CAPSULE | ORAL | Status: DC | PRN

## 2020-05-29 MED ORDER — POVIDONE-IODINE 10 % EX SWAB
2.0000 "application " | Freq: Once | CUTANEOUS | Status: AC
  Administered 2020-05-29: 2 via TOPICAL

## 2020-05-29 MED ORDER — SODIUM CHLORIDE 0.9% FLUSH: 3.0000 mL | INTRAVENOUS | Status: DC | PRN

## 2020-05-29 MED ORDER — NALBUPHINE HCL 10 MG/ML IJ SOLN: 5.0000 mg | INTRAMUSCULAR | Status: DC | PRN

## 2020-05-29 MED ORDER — FENTANYL CITRATE (PF) 100 MCG/2ML IJ SOLN
INTRAMUSCULAR | Status: DC | PRN
  Administered 2020-05-29: 15 ug via INTRATHECAL

## 2020-05-29 MED ORDER — SCOPOLAMINE 1 MG/3DAYS TD PT72: 1.0000 | MEDICATED_PATCH | Freq: Once | TRANSDERMAL | Status: DC

## 2020-05-29 MED ORDER — PHENYLEPHRINE HCL-NACL 20-0.9 MG/250ML-% IV SOLN
INTRAVENOUS | Status: DC | PRN
  Administered 2020-05-29: 60 ug/min via INTRAVENOUS

## 2020-05-29 MED ORDER — GABAPENTIN 300 MG PO CAPS
ORAL_CAPSULE | ORAL | Status: AC
  Filled 2020-05-29: qty 1

## 2020-05-29 MED ORDER — DEXTROSE 50 % IV SOLN
12.5000 g | INTRAVENOUS | Status: AC
  Administered 2020-05-29: 12.5 g via INTRAVENOUS

## 2020-05-29 MED ORDER — OXYCODONE HCL 5 MG PO TABS
5.0000 mg | ORAL_TABLET | ORAL | Status: DC | PRN
  Administered 2020-05-30: 5 mg via ORAL
  Filled 2020-05-29: qty 1

## 2020-05-29 MED ORDER — MEASLES, MUMPS & RUBELLA VAC IJ SOLR: 0.5000 mL | Freq: Once | INTRAMUSCULAR | Status: DC

## 2020-05-29 MED ORDER — ACETAMINOPHEN 160 MG/5ML PO SOLN: 325.0000 mg | ORAL | Status: DC | PRN

## 2020-05-29 MED ORDER — SOD CITRATE-CITRIC ACID 500-334 MG/5ML PO SOLN
30.0000 mL | ORAL | Status: AC
  Administered 2020-05-29: 30 mL via ORAL

## 2020-05-29 MED ORDER — BUPIVACAINE HCL (PF) 0.25 % IJ SOLN
INTRAMUSCULAR | Status: AC
  Filled 2020-05-29: qty 30

## 2020-05-29 MED ORDER — CEFAZOLIN SODIUM-DEXTROSE 2-4 GM/100ML-% IV SOLN: 2.0000 g | INTRAVENOUS | Status: DC

## 2020-05-29 MED ORDER — GABAPENTIN 100 MG PO CAPS
100.0000 mg | ORAL_CAPSULE | Freq: Three times a day (TID) | ORAL | Status: DC
  Administered 2020-05-29 – 2020-05-30 (×5): 100 mg via ORAL
  Filled 2020-05-29 (×5): qty 1

## 2020-05-29 MED ORDER — ONDANSETRON HCL 4 MG/2ML IJ SOLN
INTRAMUSCULAR | Status: DC | PRN
  Administered 2020-05-29: 4 mg via INTRAVENOUS

## 2020-05-29 MED ORDER — SCOPOLAMINE 1 MG/3DAYS TD PT72
MEDICATED_PATCH | TRANSDERMAL | Status: AC
  Filled 2020-05-29: qty 1

## 2020-05-29 MED ORDER — KETOROLAC TROMETHAMINE 30 MG/ML IJ SOLN
30.0000 mg | Freq: Four times a day (QID) | INTRAMUSCULAR | Status: AC | PRN
  Administered 2020-05-29 – 2020-05-30 (×2): 30 mg via INTRAVENOUS
  Filled 2020-05-29 (×2): qty 1

## 2020-05-29 MED ORDER — DIPHENHYDRAMINE HCL 25 MG PO CAPS: 25.0000 mg | ORAL_CAPSULE | Freq: Four times a day (QID) | ORAL | Status: DC | PRN

## 2020-05-29 MED ORDER — SIMETHICONE 80 MG PO CHEW
80.0000 mg | CHEWABLE_TABLET | Freq: Three times a day (TID) | ORAL | Status: DC
  Administered 2020-05-29 – 2020-05-31 (×7): 80 mg via ORAL
  Filled 2020-05-29 (×7): qty 1

## 2020-05-29 SURGICAL SUPPLY — 37 items
BENZOIN TINCTURE PRP APPL 2/3 (GAUZE/BANDAGES/DRESSINGS) ×2 IMPLANT
CHLORAPREP W/TINT 26ML (MISCELLANEOUS) ×2 IMPLANT
CLAMP CORD UMBIL (MISCELLANEOUS) IMPLANT
CLIP FILSHIE TUBAL LIGA STRL (Clip) IMPLANT
CLOTH BEACON ORANGE TIMEOUT ST (SAFETY) ×2 IMPLANT
DRSG OPSITE POSTOP 4X10 (GAUZE/BANDAGES/DRESSINGS) ×2 IMPLANT
ELECT REM PT RETURN 9FT ADLT (ELECTROSURGICAL) ×2
ELECTRODE REM PT RTRN 9FT ADLT (ELECTROSURGICAL) ×1 IMPLANT
EXTRACTOR VACUUM M CUP 4 TUBE (SUCTIONS) IMPLANT
GAUZE SPONGE 4X4 3PLY NS LF (GAUZE/BANDAGES/DRESSINGS) ×2 IMPLANT
GLOVE BIOGEL PI IND STRL 7.0 (GLOVE) ×3 IMPLANT
GLOVE BIOGEL PI INDICATOR 7.0 (GLOVE) ×3
GLOVE ECLIPSE 7.0 STRL STRAW (GLOVE) ×2 IMPLANT
GOWN STRL REUS W/TWL LRG LVL3 (GOWN DISPOSABLE) ×6 IMPLANT
KIT ABG SYR 3ML LUER SLIP (SYRINGE) IMPLANT
NEEDLE HYPO 22GX1.5 SAFETY (NEEDLE) ×2 IMPLANT
NEEDLE HYPO 25X5/8 SAFETYGLIDE (NEEDLE) IMPLANT
NS IRRIG 1000ML POUR BTL (IV SOLUTION) ×2 IMPLANT
PACK C SECTION WH (CUSTOM PROCEDURE TRAY) ×2 IMPLANT
PAD ABD 7.5X8 STRL (GAUZE/BANDAGES/DRESSINGS) ×2 IMPLANT
PAD ABD 8X10 STRL (GAUZE/BANDAGES/DRESSINGS) ×2 IMPLANT
PAD OB MATERNITY 4.3X12.25 (PERSONAL CARE ITEMS) ×2 IMPLANT
PENCIL SMOKE EVAC W/HOLSTER (ELECTROSURGICAL) ×2 IMPLANT
RTRCTR C-SECT PINK 25CM LRG (MISCELLANEOUS) IMPLANT
SPONGE GAUZE 4X4 12PLY STER LF (GAUZE/BANDAGES/DRESSINGS) ×2 IMPLANT
STRIP CLOSURE SKIN 1/2X4 (GAUZE/BANDAGES/DRESSINGS) ×2 IMPLANT
SUT MNCRL 0 VIOLET CTX 36 (SUTURE) ×2 IMPLANT
SUT MON AB 3-0 SH 27 (SUTURE) ×2
SUT MON AB 3-0 SH27 (SUTURE) ×2 IMPLANT
SUT MONOCRYL 0 CTX 36 (SUTURE) ×2
SUT VIC AB 0 CTX 36 (SUTURE) ×1
SUT VIC AB 0 CTX36XBRD ANBCTRL (SUTURE) ×1 IMPLANT
SUT VIC AB 4-0 KS 27 (SUTURE) ×2 IMPLANT
SYR 30ML LL (SYRINGE) ×2 IMPLANT
TOWEL OR 17X24 6PK STRL BLUE (TOWEL DISPOSABLE) ×2 IMPLANT
TRAY FOLEY W/BAG SLVR 14FR LF (SET/KITS/TRAYS/PACK) ×2 IMPLANT
WATER STERILE IRR 1000ML POUR (IV SOLUTION) ×2 IMPLANT

## 2020-05-29 NOTE — Transfer of Care (Signed)
Immediate Anesthesia Transfer of Care Note  Patient: Cindy Walker  Procedure(s) Performed: CESAREAN SECTION WITH BILATERAL TUBAL LIGATION (N/A )  Patient Location: PACU  Anesthesia Type:Spinal  Level of Consciousness: awake  Airway & Oxygen Therapy: Patient Spontanous Breathing  Post-op Assessment: Report given to RN and Post -op Vital signs reviewed and stable  Post vital signs: Reviewed and stable  Last Vitals:  Vitals Value Taken Time  BP 115/53 05/29/20 1036  Temp    Pulse 77 05/29/20 1038  Resp 21 05/29/20 1038  SpO2 97 % 05/29/20 1038  Vitals shown include unvalidated device data.  Last Pain:  Vitals:   05/29/20 0759  TempSrc: Oral         Complications: No complications documented.

## 2020-05-29 NOTE — Lactation Note (Signed)
This note was copied from a baby's chart. Lactation Consultation Note  Patient Name: Cindy Walker HUTML'Y Date: 05/29/2020 Reason for consult: Initial assessment;Other (Comment);Maternal endocrine disorder (C/S, GDM) Age:37 hours LC entered the room, mom was holding infant swaddled in clothing. Mom is breast and formula feeding infant her choice. Mom doesn't have any breastfeeding questions or concerns for LC, per mom, infant is latching and breastfeeding well. LC did not observe latch at this time. LC gave mom breastfeeding and supplemental sheet and suggest mom breastfeed infant with every feeding first  and supplement with formula afterwards to help establish mom's milk supply and prevent engorgement when her milk comes in.  Mom know to breastfeed infant by hunger cues. Per mom, she breastfeed and supplemented her eldest daughter with formula, breastfeed for 19 months. Mom made aware of O/P services, breastfeeding support groups, community resources, and our phone # for post-discharge questions.  Maternal Data Has patient been taught Hand Expression?: Yes Does the patient have breastfeeding experience prior to this delivery?: Yes How long did the patient breastfeed?: Per mom, she breastfeed her 57 year old child for 19 months with combination of breast and formula feeding.  Feeding Mother's Current Feeding Choice: Breast Milk and Formula  LATCH Score  Lactation Tools Discussed/Used    Interventions Interventions: Breast feeding basics reviewed;Skin to skin;Education  Discharge Pump: Personal [place] Program: Yes  Consult Status Consult Status: Follow-up Date: 05/30/20 Follow-up type: In-patient    Vicente Serene 05/29/2020, 2:31 PM

## 2020-05-29 NOTE — Discharge Summary (Signed)
Postpartum Discharge Summary     Patient Name: Cindy Walker DOB: Jul 06, 1985 MRN: [id number]  Date of admission: 05/29/2020 Delivery date:05/29/2020  Delivering provider: [name]  Date of discharge: 05/31/2020  Admitting diagnosis: Status post cesarean delivery [Z98.891] Intrauterine pregnancy: [redacted]w[redacted]d    Secondary diagnosis:  Principal Problem:   Status post cesarean delivery Active Problems:   Supervision of high-risk pregnancy, third trimester   Previous cesarean delivery affecting pregnancy   BMI 30.0-30.9,adult   AMA (advanced maternal age) multigravida 35+   Gestational diabetes mellitus (GDM) in third trimester   Maternal iron deficiency anemia complicating pregnancy, third trimester  Additional problems: Asthma    Discharge diagnosis: Term Pregnancy Delivered and GDM A1                                              Post partum procedures:postpartum tubal ligation Augmentation: N/A Complications: None  Hospital course: Sceduled C/S   35y.o. yo [id number] admitted to the hospital 05/29/2020 for scheduled cesarean section with the following indication:Elective Repeat.Delivery details are as follows:  Membrane Rupture Time/Date: 9:48 AM ,05/29/2020   Delivery Method:C-Section, Low Transverse  Details of operation can be found in separate operative note.  Patient had an uncomplicated postpartum course.  She is ambulating, tolerating a regular diet, passing flatus, and urinating well. Patient is discharged home in stable condition on  05/31/20        Newborn Data: Birth date:05/29/2020  Birth time:9:48 AM  Gender:Female -Alyssa Living status:Living  Apgars:8 ,9  Weight:3430 g     Magnesium Sulfate received: No BMZ received: No Rhophylac:N/A MMR:N/A T-DaP:Given prenatally Flu: Yes Transfusion:No  Physical exam  Vitals:   05/30/20 0500 05/30/20 1250 05/30/20 2114 05/31/20 0448  BP: 105/62 (!) 100/52 113/65 128/66  Pulse: 62 (!) 53 61 60   Resp: _0 Temp: 98.6 F (37 C) 98.8 F (37.1 C) 98.2 F (36.8 C) 98 F (36.7 C)  TempSrc: Tympanic Oral Oral Oral  SpO2:  100% 100%   Weight:      Height:       General: alert, cooperative and no distress Lochia: appropriate Uterine Fundus: firm Incision: Honeycomb saturated.   DVT Evaluation: No evidence of DVT seen on physical exam. No cords or calf tenderness. No significant calf/ankle edema. Labs: Lab Results  Component Value Date   WBC 9.1 05/30/2020   HGB 7.2 (L) 05/30/2020   HCT 22.7 (L) 05/30/2020   MCV 79.9 (L) 05/30/2020   PLT 270 05/30/2020   CMP Latest Ref Rng & Units 05/29/2020  Glucose 65 - 99 mg/dL -  BUN 6 - 20 mg/dL -  Creatinine 0.44 - 1.00 mg/dL 0.51  Sodium 134 - 144 mmol/L -  Potassium 3.5 - 5.2 mmol/L -  Chloride 96 - 106 mmol/L -  CO2 20 - 29 mmol/L -  Calcium 8.7 - 10.2 mg/dL -  Total Protein 6.0 - 8.5 g/dL -  Total Bilirubin 0.0 - 1.2 mg/dL -  Alkaline Phos 44 - 121 IU/L -  AST 0 - 40 IU/L -  ALT 0 - 32 IU/L -   Edinburgh Score: No flowsheet data found.   After visit meds:  Allergies as of 05/31/2020   No Known Allergies     Medication List    STOP taking these medications  Accu-Chek Guide test strip Generic drug: glucose blood   Accu-Chek Guide w/Device Kit   Accu-Chek Softclix Lancets lancets   albuterol 108 (90 Base) MCG/ACT inhaler Commonly known as: VENTOLIN HFA   aspirin EC 81 MG tablet   butalbital-acetaminophen-caffeine 50-325-40 MG tablet Commonly known as: FIORICET   lansoprazole 15 MG capsule Commonly known as: PREVACID     TAKE these medications   iron polysaccharides 150 MG capsule Commonly known as: NIFEREX Take 1 capsule (150 mg total) by mouth every other day. What changed: when to take this   multivitamin-prenatal 27-0.8 MG Tabs tablet Take 1 tablet by mouth daily at 12 noon.   oxyCODONE 5 MG immediate release tablet Commonly known as: Oxy IR/ROXICODONE Take 1-2 tablets (5-10 mg  total) by mouth every 6 (six) hours as needed for moderate pain.   senna-docusate 8.6-50 MG tablet Commonly known as: Senokot-S Take 2 tablets by mouth 2 (two) times daily.        Discharge home in stable condition Infant Feeding: Breast Infant Disposition:home with mother Discharge instruction: per After Visit Summary and Postpartum booklet. Activity: Advance as tolerated. Pelvic rest for 6 weeks.  Diet: routine diet  *Informed that if anemia symptoms return, patient to call office or report to [facility] for further assessment.   Future Appointments: Future Appointments  Date Time Provider Ashley  06/05/2020 10:00 AM [facility] NURSE [facility]  06/30/2020  8:15 AM Aletha Halim, MD [facility]   Follow up Visit:  [place] [facility] Follow up in 1 week(s).   Specialty: Obstetrics and Gynecology Contact information: [address] [place] [phone]               Please schedule this patient for a In person postpartum visit in 4 weeks with the following provider: MD. Additional Postpartum F/U:2 hour GTT and Incision check 1 week  High risk pregnancy complicated by: GDM Delivery mode:  C-Section, Low Transverse  Anticipated Birth Control:  BTL done PP   05/31/2020 Maryann Conners, CNM

## 2020-05-29 NOTE — Discharge Instructions (Signed)
Cesarean Delivery, Care After This sheet gives you information about how to care for yourself after your procedure. Your health care provider may also give you more specific instructions. If you have problems or questions, contact your health care provider. What can I expect after the procedure? After the procedure, it is common to have:  A small amount of blood or clear fluid coming from the incision.  Some redness, swelling, and pain in your incision area.  Some abdominal pain and soreness.  Vaginal bleeding (lochia). Even though you did not have a vaginal delivery, you will still have vaginal bleeding and discharge.  Pelvic cramps.  Fatigue. You may have pain, swelling, and discomfort in the tissue between your vagina and your anus (perineum) if:  Your C-section was unplanned, and you were allowed to labor and push.  An incision was made in the area (episiotomy) or the tissue tore during attempted vaginal delivery. Follow these instructions at home: Incision care  Follow instructions from your health care provider about how to take care of your incision. Make sure you: ? Wash your hands with soap and water before you change your bandage (dressing). If soap and water are not available, use hand sanitizer. ? If you have a dressing, change it or remove it as told by your health care provider. ? Leave stitches (sutures), skin staples, skin glue, or adhesive strips in place. These skin closures may need to stay in place for 2 weeks or longer. If adhesive strip edges start to loosen and curl up, you may trim the loose edges. Do not remove adhesive strips completely unless your health care provider tells you to do that.  Check your incision area every day for signs of infection. Check for: ? More redness, swelling, or pain. ? More fluid or blood. ? Warmth. ? Pus or a bad smell.  Do not take baths, swim, or use a hot tub until your health care provider says it's okay. Ask your health  care provider if you can take showers.  When you cough or sneeze, hug a pillow. This helps with pain and decreases the chance of your incision opening up (dehiscing). Do this until your incision heals.   Medicines  Take over-the-counter and prescription medicines only as told by your health care provider.  If you were prescribed an antibiotic medicine, take it as told by your health care provider. Do not stop taking the antibiotic even if you start to feel better.  Do not drive or use heavy machinery while taking prescription pain medicine. Lifestyle  Do not drink alcohol. This is especially important if you are breastfeeding or taking pain medicine.  Do not use any products that contain nicotine or tobacco, such as cigarettes, e-cigarettes, and chewing tobacco. If you need help quitting, ask your health care provider. Eating and drinking  Drink at least 8 eight-ounce glasses of water every day unless told not to by your health care provider. If you breastfeed, you may need to drink even more water.  Eat high-fiber foods every day. These foods may help prevent or relieve constipation. High-fiber foods include: ? Whole grain cereals and breads. ? Brown rice. ? Beans. ? Fresh fruits and vegetables. Activity  If possible, have someone help you care for your baby and help with household activities for at least a few days after you leave the hospital.  Return to your normal activities as told by your health care provider. Ask your health care provider what activities are safe for   you.  Rest as much as possible. Try to rest or take a nap while your baby is sleeping.  Do not lift anything that is heavier than 10 lbs (4.5 kg), or the limit that you were told, until your health care provider says that it is safe.  Talk with your health care provider about when you can engage in sexual activity. This may depend on your: ? Risk of infection. ? How fast you heal. ? Comfort and desire to  engage in sexual activity.   General instructions  Do not use tampons or douches until your health care provider approves.  Wear loose, comfortable clothing and a supportive and well-fitting bra.  Keep your perineum clean and dry. Wipe from front to back when you use the toilet.  If you pass a blood clot, save it and call your health care provider to discuss. Do not flush blood clots down the toilet before you get instructions from your health care provider.  Keep all follow-up visits for you and your baby as told by your health care provider. This is important. Contact a health care provider if:  You have: ? A fever. ? Bad-smelling vaginal discharge. ? Pus or a bad smell coming from your incision. ? Difficulty or pain when urinating. ? A sudden increase or decrease in the frequency of your bowel movements. ? More redness, swelling, or pain around your incision. ? More fluid or blood coming from your incision. ? A rash. ? Nausea. ? Little or no interest in activities you used to enjoy. ? Questions about caring for yourself or your baby.  Your incision feels warm to the touch.  Your breasts turn red or become painful or hard.  You feel unusually sad or worried.  You vomit.  You pass a blood clot from your vagina.  You urinate more than usual.  You are dizzy or light-headed. Get help right away if:  You have: ? Pain that does not go away or get better with medicine. ? Chest pain. ? Difficulty breathing. ? Blurred vision or spots in your vision. ? Thoughts about hurting yourself or your baby. ? New pain in your abdomen or in one of your legs. ? A severe headache.  You faint.  You bleed from your vagina so much that you fill more than one sanitary pad in one hour. Bleeding should not be heavier than your heaviest period. Summary  After the procedure, it is common to have pain at your incision site, abdominal cramping, and slight bleeding from your vagina.  Check  your incision area every day for signs of infection.  Tell your health care provider about any unusual symptoms.  Keep all follow-up visits for you and your baby as told by your health care provider. This information is not intended to replace advice given to you by your health care provider. Make sure you discuss any questions you have with your health care provider. Document Revised: 09/13/2017 Document Reviewed: 09/13/2017 Elsevier Patient Education  2021 [employer]vier Inc.  

## 2020-05-29 NOTE — Anesthesia Postprocedure Evaluation (Signed)
Anesthesia Post Note  Patient: Mailyn Steichen Sellick  Procedure(s) Performed: CESAREAN SECTION WITH BILATERAL TUBAL LIGATION (N/A )     Patient location during evaluation: PACU Anesthesia Type: Spinal Level of consciousness: oriented and awake and alert Pain management: pain level controlled Vital Signs Assessment: post-procedure vital signs reviewed and stable Respiratory status: spontaneous breathing, respiratory function stable and patient connected to nasal cannula oxygen Cardiovascular status: blood pressure returned to baseline and stable Postop Assessment: no headache, no backache and no apparent nausea or vomiting Anesthetic complications: no   No complications documented.  Last Vitals:  Vitals:   05/29/20 1130 05/29/20 1141  BP: (!) 108/58 102/66  Pulse: (!) 52 (!) 52  Resp: 15   Temp:  36.5 C  SpO2: 99% 99%    Last Pain:  Vitals:   05/29/20 1130  TempSrc:   PainSc: 0-No pain                 Claressa Hughley

## 2020-05-29 NOTE — H&P (Signed)
Cindy Walker is an 35 y.o. [id number] [redacted]w[redacted]d female.   Chief Complaint: H/o prior C-section  HPI: Prior C-sections x 3. Needs ERLTCS + BTL  Past Medical History:  Diagnosis Date   Asthma    prn inhaler   Gestational diabetes    Menorrhagia with regular cycle 09/06/2019   Migraine    Umbilical hernia 09/5168    Past Surgical History:  Procedure Laterality Date   BREAST CYST EXCISION Left 2014   CESAREAN SECTION  01/11/11; 09/27/2014   CESAREAN SECTION N/A 04/16/2012   Procedure: Cesarean Section;  Surgeon: Guss Bunde, MD;  Location: [facility];  Service: Obstetrics;  Laterality: N/A;  Wound Class (Clean contaminated)   INSERTION OF MESH N/A 12/01/2014   Procedure: INSERTION OF MESH;  Surgeon: Coralie Keens, MD;  Location: [facility];  Service: General;  Laterality: N/A;   UMBILICAL HERNIA REPAIR N/A 12/01/2014   Procedure: UMBILICAL HERNIA REPAIR ;  Surgeon: Coralie Keens, MD;  Location: [facility];  Service: General;  Laterality: N/A;   WISDOM TOOTH EXTRACTION      Family History  Problem Relation Age of Onset   Diabetes Maternal Aunt        Type 2   Heart disease Maternal Grandmother    Cancer Maternal Grandfather        liver cancer   Heart disease Maternal Grandfather    Heart disease Paternal Grandmother    Heart disease Paternal Grandfather    Cleft palate Son        deceased   Breast cancer Neg Hx    Social History:  reports that she quit smoking about 11 years ago. She smoked 0.00 packs per day. She has never used smokeless tobacco. She reports previous alcohol use of about 3.0 standard drinks of alcohol per week. She reports that she does not use drugs.   No Known Allergies  No medications prior to admission.     A comprehensive review of systems was negative.  Last menstrual period 08/30/2019. General appearance: alert, cooperative and appears stated age Head: Normocephalic, without obvious abnormality,  atraumatic Neck: supple, symmetrical, trachea midline Lungs: normal effort Heart: regular rate and rhythm Abdomen: gravid, non-tender Extremities: Homans sign is negative, no sign of DVT Skin: Skin color, texture, turgor normal. No rashes or lesions Neurologic: Grossly normal   Lab Results  Component Value Date   WBC 9.2 03/10/2020   HGB 10.5 (L) 03/10/2020   HCT 31.6 (L) 03/10/2020   MCV 86 03/10/2020   PLT 332 03/10/2020         ABO, Rh: A/Positive/-- (08/31 0954)  Antibody: Negative (08/31 0954)  Rubella: 3.06 (08/31 0954)  RPR: Non Reactive (12/21 0839)  HBsAg: Negative (08/31 0954)  HIV: Non Reactive (12/21 0839)  GBS: Negative/-- (02/22 0900)     Assessment/Plan For RCS + BTL Risks include but are not limited to bleeding, infection, injury to surrounding structures, including bowel, bladder and ureters, blood clots, and death.  Likelihood of success is high.   Cindy Walker 05/25/2020, 12:52 PM

## 2020-05-29 NOTE — Anesthesia Procedure Notes (Addendum)
Spinal  Patient location during procedure: OR Start time: 05/29/2020 9:27 AM End time: 05/29/2020 9:30 AM Staffing Anesthesiologist: Janeece Riggers, MD Preanesthetic Checklist Completed: patient identified, IV checked, site marked, risks and benefits discussed, surgical consent, monitors and equipment checked, pre-op evaluation and timeout performed Spinal Block Patient position: sitting Prep: DuraPrep Patient monitoring: heart rate, cardiac monitor, continuous pulse ox and blood pressure Approach: midline Location: L3-4 Injection technique: single-shot Needle Needle type: Sprotte  Needle gauge: 24 G Needle length: 9 cm Assessment Sensory level: T4

## 2020-05-29 NOTE — Progress Notes (Signed)
Pt firm at umb with multiple large clots and blood around perineum at 1hr assessment. Charge RN called to come with Triton. EBL additional 704cc. MD called. Kim Shaw,CNM to pts bedside to assess.

## 2020-05-29 NOTE — Progress Notes (Signed)
Pts bleeding stable ,firm at umb. Pt alert and ate a meal and tolerated well.

## 2020-05-29 NOTE — Op Note (Signed)
Preoperative Diagnosis:  IUP @ [redacted]w[redacted]d, Prior C-section x 3, undesired fertility Postoperative Diagnosis:  Same  Procedure: Repeat low transverse cesarean section, Bilateral Salpingectomy  Surgeon: Darron Doom, M.D.  Assistant: None  Anesthesia: spinal with Oddono, MD Findings: Viable female infant, APGAR (1 MIN):   Normal tubes and ovaries  Estimated blood loss: 2671 cc  Complications: None known  Specimens: Placenta to labor and delivery  Reason for procedure: Briefly, the patient is a 35 y.o. [id number] at [redacted]w[redacted]d with h/o previous C-section who desires permanent sterility.    Procedure: Patient is a to the OR where spinal analgesia was administered. She was then placed in a supine position with left lateral tilt. She received 2 g of Ancef and SCDs were in place. A Foley catheter was placed in the bladder. She was prepped and draped in the usual sterile fashion. A timeout was performed. A knife was then used to make a Pfannenstiel incision. This incision was carried out to underlying fascia which was divided in the midline with the knife. The incision was extended laterally, sharply. The fascia was dissected of the underlying rectus superiorly.  The rectus was divided in the midline.  The peritoneal cavity was entered bluntly.  Alexis retractor was placed inside the incision.  A knife was used to make a low transverse incision on the uterus. This incision was carried down to the amniotic cavity was entered. Fetus was in ROT position and was brought up out of the incision without difficulty. Cord was clamped x 2 and cut. Infant taken to waiting pediatrician.  Cord blood was obtained. Placenta was delivered from the uterus.  Uterus was cleaned with dry lap pads. Uterine incision closed with 0 Monocryl suture in a locked running fashion. Attention was turned to the pt's left tube which was grasped with a Babcock clamp and followed to its fimbriated end.  The tube was doubly clamped removed in its  entirety and ligated with Monocryl suture with a Heaney suture x 2.  Attention was turned to the pt's right tube which was grasped with a Babcock clamp and followed to its fimbriated end. The tube was doubly clamped removed in its entirety and ligated with Monocryl suture with a Heaney suture x 2.  Alexis retractor was removed from the abdomen. Peritoneal closure was done with 0 Monocryl suture.  There was bleeding from the rectus and electrocautery and suture was used to obtain hemostasis.  Fascia is closed with 0 Vicryl suture in a running fashion. Subcutaneous tissue infused with 30cc 0.25% Marcaine.  Subcutaneous closure was performed with 0 Monocryl suture.  Skin closed using 3-0 Vicryl on a Keith needle.  Steri strips applied, followed by pressure dressing.  All instrument, needle and lap counts were correct x 2.  Patient was awake and taken to PACU stable.  Infant remained with mom skin to skin, stable.

## 2020-05-30 LAB — CBC
HCT: 22.7 % — ABNORMAL LOW (ref 36.0–46.0)
Hemoglobin: 7.2 g/dL — ABNORMAL LOW (ref 12.0–15.0)
MCH: 25.4 pg — ABNORMAL LOW (ref 26.0–34.0)
MCHC: 31.7 g/dL (ref 30.0–36.0)
MCV: 79.9 fL — ABNORMAL LOW (ref 80.0–100.0)
Platelets: 270 10*3/uL (ref 150–400)
RBC: 2.84 MIL/uL — ABNORMAL LOW (ref 3.87–5.11)
RDW: 14.7 % (ref 11.5–15.5)
WBC: 9.1 10*3/uL (ref 4.0–10.5)
nRBC: 0 % (ref 0.0–0.2)

## 2020-05-30 MED ORDER — SODIUM CHLORIDE 0.9 % IV SOLN
500.0000 mg | Freq: Once | INTRAVENOUS | Status: AC
  Administered 2020-05-30: 500 mg via INTRAVENOUS
  Filled 2020-05-30: qty 25

## 2020-05-30 NOTE — Progress Notes (Signed)
POSTPARTUM PROGRESS NOTE  Subjective: Cindy Walker is a 35 y.o. [phone] s/p rLTCS/BTL at [redacted]w[redacted]d. She reports she's doing well. No acute events overnight. She denies any problems with ambulating, voiding or po intake. Denies nausea or vomiting. She has not yet passed flatus. Pain is well controlled.  Lochia is appropriate.  Objective: Blood pressure (!) 105/57, pulse (!) 56, temperature 97.9 F (36.6 C), temperature source Oral, resp. rate 17, height 5\' 3"  (1.6 m), weight 89.9 kg, last menstrual period 08/30/2019, SpO2 99 %, unknown if currently breastfeeding.  Physical Exam:  General: alert, cooperative and no distress Chest: no respiratory distress Abdomen: soft, non-tender, dressing c/d/i Uterine Fundus: firm and at level of umbilicus Extremities: No calf swelling or tenderness  Recent Labs    05/27/20 0938  HGB 9.0*  HCT 28.9*    Assessment/Plan: Cindy Walker is a 35 y.o. [phone] s/p rLTCS and BTL at [redacted]w[redacted]d.  Routine Postpartum Care: Doing well, pain well-controlled.  -- Continue routine care -- Contraception: s/p BTL -- Feeding: Breast, lactation support prn Anemia: POD#1 Hgb 7.2 (from 9.0 on admission). Having some lightheadedness with standing/ambulation. Will give IV Venofer. A1GDM: f/u results of am fasting glucose  Dispo: Plan for discharge tomorrow (POD#2).   Alcus Dad, MD PGY-1 Family Medicine 05/30/2020 2:58 AM

## 2020-05-30 NOTE — Lactation Note (Signed)
This note was copied from a baby's chart. Lactation Consultation Note  Patient Name: Girl Vinita Prentiss [id number] Date: 05/30/2020 Reason for consult: Follow-up assessment Age:37 hours  Mom reports she is really too sleepy to feed well yet.  Urged STS.  Explained some babies really sleepy first day and start to wake up more second night. Mom pumping.  Urged her to continue to try and feed on cue and 8-12 or more times day.  Urged to call lactation as needed.  Maternal Data    Feeding Mother's Current Feeding Choice: Breast Milk and Formula  LATCH Score                    Lactation Tools Discussed/Used    Interventions Interventions: Breast feeding basics reviewed  Discharge    Consult Status Consult Status: Follow-up Date: 05/30/20 Follow-up type: In-patient    Rolan Wrightsman Thompson Caul 05/30/2020, 1:38 PM

## 2020-05-31 ENCOUNTER — Ambulatory Visit: Payer: Self-pay

## 2020-05-31 LAB — GLUCOSE, CAPILLARY: Glucose-Capillary: 73 mg/dL (ref 70–99)

## 2020-05-31 MED ORDER — OXYCODONE HCL 5 MG PO TABS
5.0000 mg | ORAL_TABLET | Freq: Four times a day (QID) | ORAL | 0 refills | Status: DC | PRN
Start: 2020-05-31 — End: 2021-02-16

## 2020-05-31 MED ORDER — SENNOSIDES-DOCUSATE SODIUM 8.6-50 MG PO TABS: 2.0000 | ORAL_TABLET | Freq: Two times a day (BID) | ORAL | 1 refills | Status: DC

## 2020-05-31 NOTE — Lactation Note (Signed)
This note was copied from a baby's chart. Lactation Consultation Note  Patient Name: Cindy Walker POIPP'G Date: 05/31/2020 Reason for consult: Follow-up assessment;Term;Infant weight loss;Other (Comment) (2 % weight loss. per mom I have been bottle feeding , and just pumping and may only pump and bottlefeed . LC reviewed if she decided to transition to the breast once the milk came in. see D/C teaching below for details. mom denies sore nipples.) Age:92 hours    Maternal Data    Feeding Mother's Current Feeding Choice: Breast Milk and Formula  LATCH Score                    Lactation Tools Discussed/Used Tools: Pump Breast pump type: Double-Electric Breast Pump  Interventions    Discharge Discharge Education: Engorgement and breast care;Warning signs for feeding baby (per mom the RN reviewed warning signs to look for the baby. LC reviewed mastitis S/S.) Pump: Personal  Consult Status Consult Status: Complete Date: 05/31/20    Myer Haff 05/31/2020, 11:18 AM

## 2020-06-01 LAB — SURGICAL PATHOLOGY

## 2020-06-05 ENCOUNTER — Other Ambulatory Visit: Payer: Self-pay

## 2020-06-05 ENCOUNTER — Ambulatory Visit (INDEPENDENT_AMBULATORY_CARE_PROVIDER_SITE_OTHER): Payer: BLUE CROSS/BLUE SHIELD | Admitting: *Deleted

## 2020-06-05 VITALS — BP 126/80 | HR 66

## 2020-06-05 DIAGNOSIS — Z98891 History of uterine scar from previous surgery: Secondary | ICD-10-CM

## 2020-06-05 NOTE — Progress Notes (Signed)
Pt here for incision check from her cesarean section on 05/29/2020.   Incision is healing well. Honeycomb removed. Steri strips intact. Wound instructions given.   Pt to return as needed and or at postpartum visit.

## 2020-06-07 NOTE — Progress Notes (Signed)
Patient was assessed and managed by nursing staff during this encounter. I have reviewed the chart and agree with the documentation and plan. I have also made any necessary editorial changes.  Verita Schneiders, MD 06/07/2020 9:11 AM

## 2020-06-17 ENCOUNTER — Other Ambulatory Visit: Payer: Self-pay

## 2020-06-17 ENCOUNTER — Ambulatory Visit (INDEPENDENT_AMBULATORY_CARE_PROVIDER_SITE_OTHER): Payer: BLUE CROSS/BLUE SHIELD | Admitting: *Deleted

## 2020-06-17 DIAGNOSIS — Z98891 History of uterine scar from previous surgery: Secondary | ICD-10-CM

## 2020-06-17 NOTE — Progress Notes (Signed)
Pt here today concerned about her incision. States she thought she has some tender spots and leakage.   Incision is healing well. No drainage or redness noted. Pt does has some skin peeling around the incision from bandage irritation and some possible resolving yeast.    Advised to continue to keep clean and dry. Pt to follow up as needed and or at postpartum visit.  Crosby Oyster, RN

## 2020-06-30 ENCOUNTER — Ambulatory Visit (INDEPENDENT_AMBULATORY_CARE_PROVIDER_SITE_OTHER): Payer: BLUE CROSS/BLUE SHIELD | Admitting: Obstetrics and Gynecology

## 2020-06-30 ENCOUNTER — Other Ambulatory Visit: Payer: Self-pay

## 2020-06-30 VITALS — BP 121/82 | HR 65 | Wt 178.0 lb

## 2020-06-30 DIAGNOSIS — B372 Candidiasis of skin and nail: Secondary | ICD-10-CM

## 2020-06-30 DIAGNOSIS — O24410 Gestational diabetes mellitus in pregnancy, diet controlled: Secondary | ICD-10-CM

## 2020-06-30 NOTE — Patient Instructions (Signed)
Apply some antifungal to the area above the incision

## 2020-06-30 NOTE — Progress Notes (Signed)
    [place] Partum Visit Note  Cindy Walker is a 35 y.o. [phone] who presents for a postpartum visit. She is s/p 3/11 39wk schedule rpt c/s and bilateral salpingectomy. I have fully reviewed the prenatal and intrapartum course.  Anesthesia: spinal. Postpartum course has been uncomplicated. Baby is doing well. Baby is feeding by breast. Bleeding still having some light bleeding off and on.. Bowel function is normal. Bladder function is normal. Patient is not sexually active. Contraception method is tubal ligation. Postpartum depression screening: negative.  2019 pap and HPV negative  Review of Systems Pertinent items noted in HPI and remainder of comprehensive ROS otherwise negative.  Objective:  BP 121/82   Pulse 65   Wt 178 lb (80.7 kg)   BMI 31.53 kg/m    NAD Abdomen: soft, nttp, nd. Incision c/d/i with a few sutures strands seen. These were cut. Mild erythema on the lower edge c/w yeast Assessment:    Normal postpartum exam.   Plan:   *PP: recommend anti-fungal cream to incision. routine care. Follow up bleeding. Expectations d/w her and when to call clinic *GDMa1: 2h GTT today  RTC: PRN  Aletha Halim, MD [facility]

## 2020-07-01 ENCOUNTER — Encounter: Payer: Self-pay | Admitting: Obstetrics and Gynecology

## 2020-07-01 LAB — GLUCOSE TOLERANCE, 1 HOUR: Glucose, 1Hr PP: 99 mg/dL (ref 65–199)

## 2020-07-18 NOTE — Progress Notes (Signed)
Chart reviewed for nurse visit. Agree with plan of care.   [place], [place] 07/18/2020 9:43 PM

## 2021-02-16 ENCOUNTER — Other Ambulatory Visit: Payer: Self-pay

## 2021-02-16 ENCOUNTER — Encounter: Payer: Self-pay | Admitting: Nurse Practitioner

## 2021-02-16 ENCOUNTER — Ambulatory Visit (INDEPENDENT_AMBULATORY_CARE_PROVIDER_SITE_OTHER): Payer: BLUE CROSS/BLUE SHIELD | Admitting: Nurse Practitioner

## 2021-02-16 VITALS — BP 117/80 | HR 80 | Temp 97.8°F | Ht 63.0 in | Wt 200.0 lb

## 2021-02-16 DIAGNOSIS — R5383 Other fatigue: Secondary | ICD-10-CM

## 2021-02-16 DIAGNOSIS — Z7689 Persons encountering health services in other specified circumstances: Secondary | ICD-10-CM | POA: Diagnosis not present

## 2021-02-16 DIAGNOSIS — D509 Iron deficiency anemia, unspecified: Secondary | ICD-10-CM | POA: Diagnosis not present

## 2021-02-16 DIAGNOSIS — Z8632 Personal history of gestational diabetes: Secondary | ICD-10-CM | POA: Diagnosis not present

## 2021-02-16 DIAGNOSIS — Z6836 Body mass index (BMI) 36.0-36.9, adult: Secondary | ICD-10-CM

## 2021-02-16 DIAGNOSIS — R635 Abnormal weight gain: Secondary | ICD-10-CM

## 2021-02-16 DIAGNOSIS — Z0000 Encounter for general adult medical examination without abnormal findings: Secondary | ICD-10-CM

## 2021-02-16 DIAGNOSIS — M67479 Ganglion, unspecified ankle and foot: Secondary | ICD-10-CM | POA: Diagnosis not present

## 2021-02-16 NOTE — Patient Instructions (Addendum)
Managing Depression, Adult Depression is a mental health condition that affects your thoughts, feelings, and actions. Being diagnosed with depression can bring you relief if you did not know why you have felt or behaved a certain way. It could also leave you feeling overwhelmed with uncertainty about your future. Preparing yourself to manage your symptoms can help you feel more positive about your future. How to manage lifestyle changes Managing stress Stress is your body's reaction to life changes and events, both good and bad. Stress can add to your feelings of depression. Learning to manage your stress can help lessen your feelings of depression. Try some of the following approaches to reducing your stress (stress reduction techniques): Listen to music that you enjoy and that inspires you. Try using a meditation app or take a meditation class. Develop a practice that helps you connect with your spiritual self. Walk in nature, pray, or go to a place of worship. Do some deep breathing. To do this, inhale slowly through your nose. Pause at the top of your inhale for a few seconds and then exhale slowly, letting your muscles relax. Practice yoga to help relax and work your muscles. Choose a stress reduction technique that suits your lifestyle and personality. These techniques take time and practice to develop. Set aside 5-15 minutes a day to do them. Therapists can offer training in these techniques. Other things you can do to manage stress include: Keeping a stress diary. Knowing your limits and saying no when you think something is too much. Paying attention to how you react to certain situations. You may not be able to control everything, but you can change your reaction. Adding humor to your life by watching funny films or TV shows. Making time for activities that you enjoy and that relax you.  Medicines Medicines, such as antidepressants, are often a part of treatment for depression. Talk  with your pharmacist or health care provider about all the medicines, supplements, and herbal products that you take, their possible side effects, and what medicines and other products are safe to take together. Make sure to report any side effects you may have to your health care provider. Relationships Your health care provider may suggest family therapy, couples therapy, or individual therapy as part of your treatment. How to recognize changes Everyone responds differently to treatment for depression. As you recover from depression, you may start to: Have more interest in doing activities. Feel less hopeless. Have more energy. Overeat less often, or have a better appetite. Have better mental focus. It is important to recognize if your depression is not getting better or is getting worse. The symptoms you had in the beginning may return, such as: Tiredness (fatigue) or low energy. Eating too much or too little. Sleeping too much or too little. Feeling restless, agitated, or hopeless. Trouble focusing or making decisions. Unexplained physical complaints. Feeling irritable, angry, or aggressive. If you or your family members notice these symptoms coming back, let your health care provider know right away. Follow these instructions at home: Activity  Try to get some form of exercise each day, such as walking, biking, swimming, or lifting weights. Practice stress reduction techniques. Engage your mind by taking a class or doing some volunteer work. Lifestyle Get the right amount and quality of sleep. Cut down on using caffeine, tobacco, alcohol, and other potentially harmful substances. Eat a healthy diet that includes plenty of vegetables, fruits, whole grains, low-fat dairy products, and lean protein. Do not eat a lot   of foods that are high in solid fats, added sugars, or salt (sodium). General instructions Take over-the-counter and prescription medicines only as told by your health  care provider. Keep all follow-up visits as told by your health care provider. This is important. Where to find support Talking to others Friends and family members can be sources of support and guidance. Talk to trusted friends or family members about your condition. Explain your symptoms to them, and let them know that you are working with a health care provider to treat your depression. Tell friends and family members how they also can be helpful. Finances Find appropriate mental health providers that fit with your financial situation. Talk with your health care provider about options to get reduced prices on your medicines. Where to find more information You can find support in your area from: Anxiety and Depression Association of America (ADAA): www.adaa.org Mental Health America: www.mentalhealthamerica.net [employer] on Mental Illness: www.nami.org Contact a health care provider if: You stop taking your antidepressant medicines, and you have any of these symptoms: Nausea. Headache. Light-headedness. Chills and body aches. Not being able to sleep (insomnia). You or your friends and family think your depression is getting worse. Get help right away if: You have thoughts of hurting yourself or others. If you ever feel like you may hurt yourself or others, or have thoughts about taking your own life, get help right away. Go to your nearest emergency department or: Call your local emergency services (911 in the [place] Call a suicide crisis helpline, such as the [facility] at (562)378-8543 or 988 in the [place]. This is open 24 hours a day in the [place] Text the Crisis Text Line at 3343446462 (in the [place].). Summary If you are diagnosed with depression, preparing yourself to manage your symptoms is a good way to feel positive about your future. Work with your health care provider on a management plan that includes stress reduction techniques, medicines (if  applicable), therapy, and healthy lifestyle habits. Keep talking with your health care provider about how your treatment is working. If you have thoughts about taking your own life, call a suicide crisis helpline or text a crisis text line. This information is not intended to replace advice given to you by your health care provider. Make sure you discuss any questions you have with your health care provider. Document Revised: 09/30/2020 Document Reviewed: 01/16/2019 Elsevier Patient Education  2022 [place] and Cholesterol Restricted Eating Plan Getting too much fat and cholesterol in your diet may cause health problems. Choosing the right foods helps keep your fat and cholesterol at normal levels. This can keep you from getting certain diseases. Your doctor may recommend an eating plan that includes: Total fat: ______% or less of total calories a day. This is ______g of fat a day. Saturated fat: ______% or less of total calories a day. This is ______g of saturated fat a day. Cholesterol: less than _________mg a day. Fiber: ______g a day. What are tips for following this plan? General tips Work with your doctor to lose weight if you need to. Avoid: Foods with added sugar. Fried foods. Foods with trans fat or partially hydrogenated oils. This includes some margarines and baked goods. If you drink alcohol: Limit how much you have to: 0-1 drink a day for women who are not pregnant. 0-2 drinks a day for men. Know how much alcohol is in a drink. In the [place] one drink equals one 12 oz bottle  of beer (355 mL), one 5 oz glass of wine (148 mL), or one 1 oz glass of hard liquor (44 mL). Reading food labels Check food labels for: Trans fats. Partially hydrogenated oils. Saturated fat (g) in each serving. Cholesterol (mg) in each serving. Fiber (g) in each serving. Choose foods with healthy fats, such as: Monounsaturated fats and polyunsaturated fats. These include olive and canola  oil, flaxseeds, walnuts, almonds, and seeds. Omega-3 fats. These are found in certain fish, flaxseed oil, and ground flaxseeds. Choose grain products that have whole grains. Look for the word "whole" as the first word in the ingredient list. Cooking Cook foods using low-fat methods. These include baking, boiling, grilling, and broiling. Eat more home-cooked foods. Eat at restaurants and buffets less often. Eat less fast food. Avoid cooking using saturated fats, such as butter, cream, palm oil, palm kernel oil, and coconut oil. Meal planning  At meals, divide your plate into four equal parts: Fill one-half of your plate with vegetables, green salads, and fruit. Fill one-fourth of your plate with whole grains. Fill one-fourth of your plate with low-fat (lean) protein foods. Eat fish that is high in omega-3 fats at least two times a week. This includes mackerel, tuna, sardines, and salmon. Eat foods that are high in fiber, such as whole grains, beans, apples, pears, berries, broccoli, carrots, peas, and barley. What foods should I eat? Fruits All fresh, canned (in natural juice), or frozen fruits. Vegetables Fresh or frozen vegetables (raw, steamed, roasted, or grilled). Green salads. Grains Whole grains, such as whole wheat or whole grain breads, crackers, cereals, and pasta. Unsweetened oatmeal, bulgur, barley, quinoa, or brown rice. Corn or whole wheat flour tortillas. Meats and other protein foods Ground beef (85% or leaner), grass-fed beef, or beef trimmed of fat. Skinless chicken or [place]. Ground chicken or [place]. Pork trimmed of fat. All fish and seafood. Egg whites. Dried beans, peas, or lentils. Unsalted nuts or seeds. Unsalted canned beans. Nut butters without added sugar or oil. Dairy Low-fat or nonfat dairy products, such as skim or 1% milk, 2% or reduced-fat cheeses, low-fat and fat-free ricotta or cottage cheese, or plain low-fat and nonfat yogurt. Fats and oils Tub margarine  without trans fats. Light or reduced-fat mayonnaise and salad dressings. Avocado. Olive, canola, sesame, or safflower oils. The items listed above may not be a complete list of foods and beverages you can eat. Contact a dietitian for more information. What foods should I avoid? Fruits Canned fruit in heavy syrup. Fruit in cream or butter sauce. Fried fruit. Vegetables Vegetables cooked in cheese, cream, or butter sauce. Fried vegetables. Grains White bread. White pasta. White rice. Cornbread. Bagels, pastries, and croissants. Crackers and snack foods that contain trans fat and hydrogenated oils. Meats and other protein foods Fatty cuts of meat. Ribs, chicken wings, bacon, sausage, bologna, salami, chitterlings, fatback, hot dogs, bratwurst, and packaged lunch meats. Liver and organ meats. Whole eggs and egg yolks. Chicken and [place] with skin. Fried meat. Dairy Whole or 2% milk, cream, half-and-half, and cream cheese. Whole milk cheeses. Whole-fat or sweetened yogurt. Full-fat cheeses. Nondairy creamers and whipped toppings. Processed cheese, cheese spreads, and cheese curds. Fats and oils Butter, stick margarine, lard, shortening, ghee, or bacon fat. Coconut, palm kernel, and palm oils. Beverages Alcohol. Sugar-sweetened drinks such as sodas, lemonade, and fruit drinks. Sweets and desserts Corn syrup, sugars, honey, and molasses. Candy. Jam and jelly. Syrup. Sweetened cereals. Cookies, pies, cakes, donuts, muffins, and ice cream. The items listed  above may not be a complete list of foods and beverages you should avoid. Contact a dietitian for more information. Summary Choosing the right foods helps keep your fat and cholesterol at normal levels. This can keep you from getting certain diseases. At meals, fill one-half of your plate with vegetables, green salads, and fruits. Eat high fiber foods, like whole grains, beans, apples, pears, berries, carrots, peas, and barley. Limit added sugar,  saturated fats, alcohol, and fried foods. This information is not intended to replace advice given to you by your health care provider. Make sure you discuss any questions you have with your health care provider. Document Revised: 07/17/2020 Document Reviewed: 07/17/2020 Elsevier Patient Education  [place].

## 2021-02-16 NOTE — Progress Notes (Signed)
New Patient Office Visit  Subjective:  Patient ID: Cindy Walker, female    DOB: 09-17-1985  Age: 35 y.o. MRN: [id number]  CC:  Chief Complaint  Patient presents with   New Patient (Initial Visit)    HPI Cindy Walker presents to establish new primary care provider. She does have an 38 old baby at home. She is still breast feeding. Concerned, as she has gained a lot of weight during this pregnancy. She is up fifteen pounds since  last saw her in 10/2019. She states that her weight was up to 210, but she has managed to start with weight loss. She has taken phentermine in the past which helped her to lose weight. She knows she will have to discontinue breast feeding prior to starting on appetite suppressants. She plans to begin weaning her daughter from breast feeding at the beginning of the year.  She states that she does feel tired. Has lack of motivation. Dislikes seeing herself in photos.  The patient had very mild gestational diabetes. Did not have to take medication. In fact, when in the hospital for C-section, she had to have dextrose as sugar was so low. She also required iron infusion after the birth. Had minor hemorrhage.  Has noted a cyst growing on the top of her left foot which has gradually gotten larger throughout her pregnancy. Was sore and irritated at first, but now, just seems to be getting larger and tight.   Past Medical History:  Diagnosis Date   Asthma    prn inhaler   Gestational diabetes    Gestational diabetes mellitus (GDM) in third trimester 03/12/2020   Menorrhagia with regular cycle 09/06/2019   Migraine    Umbilical hernia 04/5425    Past Surgical History:  Procedure Laterality Date   BREAST CYST EXCISION Left 2014   CESAREAN SECTION  01/11/11; 09/27/2014   CESAREAN SECTION N/A 04/16/2012   Procedure: Cesarean Section;  Surgeon: Guss Bunde, MD;  Location: [facility];  Service: Obstetrics;  Laterality: N/A;  Wound Class (Clean  contaminated)   CESAREAN SECTION WITH BILATERAL TUBAL LIGATION N/A 05/29/2020   Procedure: CESAREAN SECTION WITH BILATERAL TUBAL LIGATION;  Surgeon: [name], MD;  Location: [facility];  Service: Obstetrics;  Laterality: N/A;   INSERTION OF MESH N/A 12/01/2014   Procedure: INSERTION OF MESH;  Surgeon: Coralie Keens, MD;  Location: [facility];  Service: General;  Laterality: N/A;   UMBILICAL HERNIA REPAIR N/A 12/01/2014   Procedure: UMBILICAL HERNIA REPAIR ;  Surgeon: Coralie Keens, MD;  Location: [place];  Service: General;  Laterality: N/A;   WISDOM TOOTH EXTRACTION      Family History  Problem Relation Age of Onset   Diabetes Maternal Aunt        Type 2   Heart disease Maternal Grandmother    Cancer Maternal Grandfather        liver cancer   Heart disease Maternal Grandfather    Heart disease Paternal Grandmother    Heart disease Paternal Grandfather    Cleft palate Son        deceased   Breast cancer Neg Hx     Social History   Socioeconomic History   Marital status: Married    Spouse name: Herbie [place]   Number of children: Not on file   Years of education: Not on file   Highest education level: Not on file  Occupational History   Not on file  Tobacco Use   Smoking status: Former    Packs/day: 0.00    Types: Cigarettes    Quit date: 12/19/2008    Years since quitting: 12.1   Smokeless tobacco: Never  Vaping Use   Vaping Use: Never used  Substance and Sexual Activity   Alcohol use: Not Currently    Alcohol/week: 3.0 standard drinks    Types: 3 Glasses of wine per week    Comment: occasionally   Drug use: No   Sexual activity: Yes    Partners: Male  Other Topics Concern   Not on file  Social History Narrative   Not on file   Social Determinants of Health   Financial Resource Strain: Not on file  Food Insecurity: Not on file  Transportation Needs: Not on file  Physical Activity: Not on file  Stress: Not on file  Social  Connections: Not on file  Intimate Partner Violence: Not on file    ROS Review of Systems  Constitutional:  Positive for fatigue. Negative for activity change, appetite change, chills and fever.       Fiften pound weight gain since her last visit   HENT:  Negative for congestion, postnasal drip, rhinorrhea, sinus pressure, sinus pain, sneezing and sore throat.   Eyes: Negative.   Respiratory:  Negative for cough, chest tightness, shortness of breath and wheezing.   Cardiovascular:  Negative for chest pain and palpitations.  Gastrointestinal:  Negative for abdominal pain, constipation, diarrhea, nausea and vomiting.  Endocrine: Negative for cold intolerance, heat intolerance, polydipsia and polyuria.       Gestational diabetes during most recent pregnancy   Genitourinary:  Negative for dyspareunia, dysuria, flank pain, frequency and urgency.  Musculoskeletal:  Negative for arthralgias, back pain and myalgias.       Fluid-filled cyst growing on top of left foot.  Started during her pregnancy and has continued to grow.  Skin:  Negative for rash.  Allergic/Immunologic: Negative for environmental allergies.  Neurological:  Negative for dizziness, weakness and headaches.  Hematological:  Negative for adenopathy.  Psychiatric/Behavioral:  The patient is not nervous/anxious.    Objective:   Today's Vitals   02/21/21 1651  BP: 117/80  Pulse: 80  Temp: 97.8 F (36.6 C)  SpO2: 99%  Weight: 200 lb (90.7 kg)  Height: 5\' 3"  (1.6 m)   Body mass index is 35.43 kg/m.   Physical Exam Vitals and nursing note reviewed.  Constitutional:      Appearance: Normal appearance. She is well-developed. She is obese.  HENT:     Head: Normocephalic and atraumatic.     Nose: Nose normal.     Mouth/Throat:     Mouth: Mucous membranes are moist.     Pharynx: Oropharynx is clear.  Eyes:     Extraocular Movements: Extraocular movements intact.     Conjunctiva/sclera: Conjunctivae normal.     Pupils:  Pupils are equal, round, and reactive to light.  Cardiovascular:     Rate and Rhythm: Normal rate and regular rhythm.     Pulses: Normal pulses.     Heart sounds: Normal heart sounds.  Pulmonary:     Effort: Pulmonary effort is normal.     Breath sounds: Normal breath sounds.  Abdominal:     Palpations: Abdomen is soft.  Musculoskeletal:        General: Normal range of motion.     Cervical back: Normal range of motion and neck supple.     Comments: Medium-sized canine hmmm cyst on  the anterior surface of the left foot.  Cyst is round soft and nontender.  With palpation.  No redness or evidence of infection present.  Lymphadenopathy:     Cervical: No cervical adenopathy.  Skin:    General: Skin is warm and dry.     Capillary Refill: Capillary refill takes less than 2 seconds.  Neurological:     General: No focal deficit present.     Mental Status: She is alert and oriented to person, place, and time.  Psychiatric:        Mood and Affect: Mood normal.        Behavior: Behavior normal.        Thought Content: Thought content normal.        Judgment: Judgment normal.    Assessment & Plan:  1. Encounter to establish care Appointment today to establish new primary care provider    2. Ganglion cyst of foot Ganglion cyst on anterior aspect of left foot.  Refer to podiatry for further evaluation and treatment. - Ambulatory referral to Podiatry  3. Iron deficiency anemia, unspecified iron deficiency anemia type Will check CBC and CMP for further evaluation.  Treat as indicated. - CBC with Differential/Platelet - Comprehensive metabolic panel  4. History of gestational diabetes Check CMP and hemoglobin A1c.  Discussed results with patient when they are available. - Comprehensive metabolic panel - Hemoglobin A1c  5. Abnormal weight gain Check thyroid panel and lipid panel. - T4, free - TSH - Lipid panel  6. Other fatigue Check labs.  Discussed with patient when they are  available. - T4, free - TSH - Lipid panel  7. Healthcare maintenance Will check routine, fasting labs. - Comprehensive metabolic panel - Lipid panel   Problem List Items Addressed This Visit       Other   Abnormal weight gain   Relevant Orders   T4, free (Completed)   TSH (Completed)   Lipid panel (Completed)   Other fatigue   Relevant Orders   T4, free (Completed)   TSH (Completed)   Lipid panel (Completed)   Ganglion cyst of foot   Relevant Orders   Ambulatory referral to Podiatry   Iron deficiency anemia   Relevant Orders   CBC with Differential/Platelet (Completed)   Comprehensive metabolic panel (Completed)   History of gestational diabetes   Relevant Orders   Comprehensive metabolic panel (Completed)   Hemoglobin A1c   Other Visit Diagnoses     Encounter to establish care    -  Primary   Healthcare maintenance       Relevant Orders   Comprehensive metabolic panel (Completed)   Lipid panel (Completed)   Body mass index (BMI) of 36.0-36.9 in adult           Outpatient Encounter Medications as of 02/16/2021  Medication Sig   Albuterol Sulfate (PROAIR RESPICLICK) 169 (90 Base) MCG/ACT AEPB Inhale into the lungs.   cyclobenzaprine (FLEXERIL) 10 MG tablet Take 10 mg by mouth 3 (three) times daily as needed for muscle spasms.   ketorolac (TORADOL) 10 MG tablet Take 10 mg by mouth every 6 (six) hours as needed.   Prenatal Vit-Fe Fumarate-FA (MULTIVITAMIN-PRENATAL) 27-0.8 MG TABS tablet Take 1 tablet by mouth daily at 12 noon.   [DISCONTINUED] iron polysaccharides (NIFEREX) 150 MG capsule Take 1 capsule (150 mg total) by mouth every other day. (Patient not taking: Reported on 06/30/2020)   [DISCONTINUED] oxyCODONE (OXY IR/ROXICODONE) 5 MG immediate release tablet Take 1-2 tablets (5-10 mg  total) by mouth every 6 (six) hours as needed for moderate pain. (Patient not taking: Reported on 06/30/2020)   [DISCONTINUED] senna-docusate (SENOKOT-S) 8.6-50 MG tablet Take 2  tablets by mouth 2 (two) times daily. (Patient not taking: Reported on 06/30/2020)   No facility-administered encounter medications on file as of 02/16/2021.    Follow-up: Return in about 10 weeks (around 04/27/2021) for CPE with FBW, health maintenance exam, with pap - discuss weight .   Ronnell Freshwater, NP  This note was dictated using Systems analyst. Rapid proofreading was performed to expedite the delivery of the information. Despite proofreading, phonetic errors will occur which are common with this voice recognition software. Please take this into consideration. If there are any concerns, please contact our office.

## 2021-02-17 LAB — LIPID PANEL
Chol/HDL Ratio: 3.8 ratio (ref 0.0–4.4)
Cholesterol, Total: 220 mg/dL — ABNORMAL HIGH (ref 100–199)
HDL: 58 mg/dL (ref >39)
LDL Chol Calc (NIH): 144 mg/dL — ABNORMAL HIGH (ref 0–99)
Triglycerides: 101 mg/dL (ref 0–149)
VLDL Cholesterol Cal: 18 mg/dL (ref 5–40)

## 2021-02-17 LAB — CBC WITH DIFFERENTIAL/PLATELET
Basophils Absolute: 0.1 10*3/uL (ref 0.0–0.2)
Basos: 1 %
EOS (ABSOLUTE): 0.3 10*3/uL (ref 0.0–0.4)
Eos: 4 %
Hematocrit: 39.4 % (ref 34.0–46.6)
Hemoglobin: 12.8 g/dL (ref 11.1–15.9)
Immature Grans (Abs): 0 10*3/uL (ref 0.0–0.1)
Immature Granulocytes: 0 %
Lymphocytes Absolute: 2.7 10*3/uL (ref 0.7–3.1)
Lymphs: 33 %
MCH: 27.9 pg (ref 26.6–33.0)
MCHC: 32.5 g/dL (ref 31.5–35.7)
MCV: 86 fL (ref 79–97)
Monocytes Absolute: 0.5 10*3/uL (ref 0.1–0.9)
Monocytes: 7 %
Neutrophils Absolute: 4.7 10*3/uL (ref 1.4–7.0)
Neutrophils: 55 %
Platelets: 377 10*3/uL (ref 150–450)
RBC: 4.58 x10E6/uL (ref 3.77–5.28)
RDW: 13.4 % (ref 11.7–15.4)
WBC: 8.3 10*3/uL (ref 3.4–10.8)

## 2021-02-17 LAB — COMPREHENSIVE METABOLIC PANEL
ALT: 21 IU/L (ref 0–32)
AST: 17 IU/L (ref 0–40)
Albumin/Globulin Ratio: 2 (ref 1.2–2.2)
Albumin: 4.8 g/dL (ref 3.8–4.8)
Alkaline Phosphatase: 85 IU/L (ref 44–121)
BUN/Creatinine Ratio: 26 — ABNORMAL HIGH (ref 9–23)
BUN: 19 mg/dL (ref 6–20)
Bilirubin Total: 0.3 mg/dL (ref 0.0–1.2)
CO2: 27 mmol/L (ref 20–29)
Calcium: 9.8 mg/dL (ref 8.7–10.2)
Chloride: 99 mmol/L (ref 96–106)
Creatinine, Ser: 0.73 mg/dL (ref 0.57–1.00)
Globulin, Total: 2.4 g/dL (ref 1.5–4.5)
Glucose: 86 mg/dL (ref 70–99)
Potassium: 4.4 mmol/L (ref 3.5–5.2)
Sodium: 141 mmol/L (ref 134–144)
Total Protein: 7.2 g/dL (ref 6.0–8.5)
eGFR: 109 mL/min/{1.73_m2} (ref >59)

## 2021-02-17 LAB — TSH: TSH: 0.874 u[IU]/mL (ref 0.450–4.500)

## 2021-02-17 LAB — HEMOGLOBIN A1C
Est. average glucose Bld gHb Est-mCnc: 120 mg/dL
Hgb A1c MFr Bld: 5.8 % — ABNORMAL HIGH (ref 4.8–5.6)

## 2021-02-17 LAB — T4, FREE: Free T4: 0.9 ng/dL (ref 0.82–1.77)

## 2021-02-19 ENCOUNTER — Encounter: Payer: Self-pay | Admitting: Podiatry

## 2021-02-19 ENCOUNTER — Ambulatory Visit (INDEPENDENT_AMBULATORY_CARE_PROVIDER_SITE_OTHER): Payer: BLUE CROSS/BLUE SHIELD

## 2021-02-19 ENCOUNTER — Other Ambulatory Visit: Payer: Self-pay

## 2021-02-19 ENCOUNTER — Ambulatory Visit (INDEPENDENT_AMBULATORY_CARE_PROVIDER_SITE_OTHER): Payer: BLUE CROSS/BLUE SHIELD | Admitting: Podiatry

## 2021-02-19 VITALS — BP 126/81 | HR 70 | Temp 98.2°F | Resp 16

## 2021-02-19 DIAGNOSIS — M67472 Ganglion, left ankle and foot: Secondary | ICD-10-CM

## 2021-02-19 DIAGNOSIS — R2242 Localized swelling, mass and lump, left lower limb: Secondary | ICD-10-CM | POA: Diagnosis not present

## 2021-02-19 NOTE — Progress Notes (Signed)
   HPI: 35 y.o. female presenting today for evaluation of ganglion cyst to the left foot has been present for about a year.  She saw her PCP who referred her here.  Patient states that she has had this recurrent but this 1 has been present and slowly gotten larger in size over the past year.  She tries to wear shoes that do not irritate the area.  She presents for further treatment and evaluation  Past Medical History:  Diagnosis Date   Asthma    prn inhaler   Gestational diabetes    Gestational diabetes mellitus (GDM) in third trimester 03/12/2020   Menorrhagia with regular cycle 09/06/2019   Migraine    Umbilical hernia 0/3559     Physical Exam: General: The patient is alert and oriented x3 in no acute distress.  Dermatology: Skin is warm, dry and supple bilateral lower extremities. Negative for open lesions or macerations.  Vascular: Palpable pedal pulses bilaterally. No edema or erythema noted. Capillary refill within normal limits.  Neurological: Epicritic and protective threshold grossly intact bilaterally.   Musculoskeletal Exam: Range of motion within normal limits to all pedal and ankle joints bilateral. Muscle strength 5/5 in all groups bilateral.  Fluctuant ganglion cyst noted to the dorsal lateral aspect of the left forefoot.  Radiographic Exam:  Normal osseous mineralization. Joint spaces preserved.  Accessory navicular noted which is asymptomatic on clinical exam.  Soft tissue swelling around the dorsal lateral aspect of the forefoot consistent with clinical findings of a ganglion cyst  Assessment: 1.  Ganglion cyst left forefoot   Plan of Care:  1. Patient evaluated. X-Rays reviewed.  2.  Pressure was applied directly to the ganglion cyst of the forefoot and the cyst was ruptured and the fluid was resorbed into the surrounding tissue of the foot.  Patient tolerated this very well 3.  Compression ankle sleeve dispensed.  Wear daily x1 week 4.  I did explain to the  patient that there is a high chance of recurrence.  Recommend daily massage to the area to prevent recurrence  5.  Return to clinic as needed  *Stay-at-home mom     Edrick Kins, DPM [facility]  Dr. Edrick Kins, DPM    [address], [place] [address]                Office [phone]  Fax [phone]

## 2021-02-21 DIAGNOSIS — M67479 Ganglion, unspecified ankle and foot: Secondary | ICD-10-CM | POA: Insufficient documentation

## 2021-02-21 DIAGNOSIS — Z8632 Personal history of gestational diabetes: Secondary | ICD-10-CM | POA: Insufficient documentation

## 2021-02-21 DIAGNOSIS — D509 Iron deficiency anemia, unspecified: Secondary | ICD-10-CM | POA: Insufficient documentation

## 2021-02-22 ENCOUNTER — Encounter: Payer: Self-pay | Admitting: Nurse Practitioner

## 2021-02-22 NOTE — Progress Notes (Signed)
MyChart message sent to patient.

## 2021-04-28 ENCOUNTER — Encounter: Payer: BLUE CROSS/BLUE SHIELD | Admitting: Nurse Practitioner

## 2021-05-14 ENCOUNTER — Other Ambulatory Visit: Payer: Self-pay

## 2021-05-14 ENCOUNTER — Ambulatory Visit
Admission: RE | Admit: 2021-05-14 | Discharge: 2021-05-14 | Disposition: A | Discharge status: Home / Self Care | Payer: BLUE CROSS/BLUE SHIELD | Source: Ambulatory Visit | Attending: Student | Admitting: Student

## 2021-05-14 DIAGNOSIS — Z20818 Contact with and (suspected) exposure to other bacterial communicable diseases: Secondary | ICD-10-CM | POA: Insufficient documentation

## 2021-05-14 DIAGNOSIS — Z112 Encounter for screening for other bacterial diseases: Secondary | ICD-10-CM | POA: Diagnosis present

## 2021-05-14 DIAGNOSIS — J020 Streptococcal pharyngitis: Secondary | ICD-10-CM | POA: Diagnosis present

## 2021-05-14 LAB — POCT RAPID STREP A (OFFICE): Rapid Strep A Screen: NEGATIVE

## 2021-05-14 MED ORDER — AMOXICILLIN 500 MG PO CAPS: 500.0000 mg | ORAL_CAPSULE | Freq: Two times a day (BID) | ORAL | 0 refills | Status: DC

## 2021-05-14 NOTE — ED Triage Notes (Signed)
Pt c/o ST since yesterday and a fever that started today.

## 2021-05-14 NOTE — ED Provider Notes (Signed)
Cindy Walker    CSN: [id number] Arrival date & time: 05/14/21  1734      History   Chief Complaint Chief Complaint  Patient presents with   Sore Throat   Fever    HPI Cindy Walker is a 36 y.o. female presenting with strep symptoms following exposure to strep. History noncontributory. Describes sore throat and fevers as high as 101 at home. Denies other symptoms including cough, congestion, n/v/d/c, abd pain, SOB, CP. Tylenol/ibuprofen with some relief.  HPI  Past Medical History:  Diagnosis Date   Asthma    prn inhaler   Gestational diabetes    Gestational diabetes mellitus (GDM) in third trimester 03/12/2020   Menorrhagia with regular cycle 09/06/2019   Migraine    Umbilical hernia 03/930    Patient Active Problem List   Diagnosis Date Noted   Ganglion cyst of foot 02/21/2021   Iron deficiency anemia 02/21/2021   History of gestational diabetes 02/21/2021   Status post cesarean delivery 05/29/2020   Maternal iron deficiency anemia complicating pregnancy, third trimester 03/12/2020   AMA (advanced maternal age) multigravida 35+ 11/19/2019   Maternal obesity affecting pregnancy, antepartum 11/19/2019   Breast lump on right side at 6 o'clock position 05/27/2019   Other fatigue 08/01/2018   Vitamin D deficiency 06/21/2018   BMI 30.0-30.9,adult 06/21/2018   Abnormal weight gain 06/09/2018   Mild intermittent asthma without complication 35/57/3220   Umbilical hernia 25/42/7062   Previous cesarean delivery affecting pregnancy 03/11/2014   Supervision of high-risk pregnancy, third trimester 02/26/2014   Family history of cleft palate with cleft lip 03/22/2012   Migraine 02/24/2012    Past Surgical History:  Procedure Laterality Date   BREAST CYST EXCISION Left 2014   CESAREAN SECTION  01/11/11; 09/27/2014   CESAREAN SECTION N/A 04/16/2012   Procedure: Cesarean Section;  Surgeon: Guss Bunde, MD;  Location: [facility];  Service: Obstetrics;   Laterality: N/A;  Wound Class (Clean contaminated)   CESAREAN SECTION WITH BILATERAL TUBAL LIGATION N/A 05/29/2020   Procedure: CESAREAN SECTION WITH BILATERAL TUBAL LIGATION;  Surgeon: [name], MD;  Location: [facility];  Service: Obstetrics;  Laterality: N/A;   INSERTION OF MESH N/A 12/01/2014   Procedure: INSERTION OF MESH;  Surgeon: Coralie Keens, MD;  Location: [facility];  Service: General;  Laterality: N/A;   UMBILICAL HERNIA REPAIR N/A 12/01/2014   Procedure: [place] ;  Surgeon: Coralie Keens, MD;  Location: [facility];  Service: General;  Laterality: N/A;   WISDOM TOOTH EXTRACTION      OB History     Gravida  4   Para  4   Term  3   Preterm  1   AB      Living  3      SAB      IAB      Ectopic      Multiple  0   Live Births  4        Obstetric Comments  Son passed in 2/13-delivered c-section          Home Medications    Prior to Admission medications   Medication Sig Start Date End Date Taking? Authorizing Provider  amoxicillin (AMOXIL) 500 MG capsule Take 1 capsule (500 mg total) by mouth in the morning and at bedtime for 10 days. 05/14/21 05/24/21 Yes Hazel Sams, PA-C  Albuterol Sulfate (PROAIR RESPICLICK) 376 (90 Base) MCG/ACT AEPB Inhale into the lungs.  [provider]  cyclobenzaprine (FLEXERIL) 10 MG tablet Take 10 mg by mouth 3 (three) times daily as needed for muscle spasms.    [provider]  ketorolac (TORADOL) 10 MG tablet Take 10 mg by mouth every 6 (six) hours as needed.    [provider]  Prenatal Vit-Fe Fumarate-FA (MULTIVITAMIN-PRENATAL) 27-0.8 MG TABS tablet Take 1 tablet by mouth daily at 12 noon.    [provider]    Family History Family History  Problem Relation Age of Onset   Diabetes Maternal Aunt        Type 2   Heart disease Maternal Grandmother    Cancer Maternal Grandfather        liver cancer   Heart disease Maternal  Grandfather    Heart disease Paternal Grandmother    Heart disease Paternal Grandfather    Cleft palate Son        deceased   Breast cancer Neg Hx     Social History Social History   Tobacco Use   Smoking status: Former    Packs/day: 0.00    Types: Cigarettes    Quit date: 12/19/2008    Years since quitting: 12.4   Smokeless tobacco: Never  Vaping Use   Vaping Use: Never used  Substance Use Topics   Alcohol use: Not Currently    Alcohol/week: 3.0 standard drinks    Types: 3 Glasses of wine per week    Comment: occasionally   Drug use: No     Allergies   Patient has no known allergies.   Review of Systems Review of Systems  Constitutional:  Positive for chills and fever. Negative for appetite change.  HENT:  Positive for sore throat. Negative for congestion, ear pain, rhinorrhea, sinus pressure and sinus pain.   Eyes:  Negative for redness and visual disturbance.  Respiratory:  Negative for cough, chest tightness, shortness of breath and wheezing.   Cardiovascular:  Negative for chest pain and palpitations.  Gastrointestinal:  Negative for abdominal pain, constipation, diarrhea, nausea and vomiting.  Genitourinary:  Negative for dysuria, frequency and urgency.  Musculoskeletal:  Negative for myalgias.  Neurological:  Negative for dizziness, weakness and headaches.  Psychiatric/Behavioral:  Negative for confusion.   All other systems reviewed and are negative.   Physical Exam Triage Vital Signs ED Triage Vitals  Enc Vitals Group     BP 05/14/21 1754 118/79     Pulse Rate 05/14/21 1754 (!) 103     Resp 05/14/21 1754 16     Temp 05/14/21 1754 (!) 100.6 F (38.1 C)     Temp Source 05/14/21 1754 Oral     SpO2 05/14/21 1754 100 %     Weight --      Height --      Head Circumference --      Peak Flow --      Pain Score 05/14/21 1753 7     Pain Loc --      Pain Edu? --      Excl. in [place]? --    No data found.  Updated Vital Signs BP 118/79 (BP Location:  Right Arm)    Pulse (!) 103    Temp (!) 100.6 F (38.1 C) (Oral)    Resp 16    SpO2 100%    Breastfeeding Yes   Visual Acuity Right Eye Distance:   Left Eye Distance:   Bilateral Distance:    Right Eye Near:   Left Eye Near:  Bilateral Near:     Physical Exam Vitals reviewed.  Constitutional:      Appearance: Normal appearance. She is ill-appearing.  HENT:     Head: Normocephalic and atraumatic.     Right Ear: Hearing, tympanic membrane, ear canal and external ear normal. No swelling or tenderness. No middle ear effusion. There is no impacted cerumen. No mastoid tenderness. Tympanic membrane is not injected, scarred, perforated, erythematous, retracted or bulging.     Left Ear: Hearing, tympanic membrane, ear canal and external ear normal. No swelling or tenderness.  No middle ear effusion. There is no impacted cerumen. No mastoid tenderness. Tympanic membrane is not injected, scarred, perforated, erythematous, retracted or bulging.     Mouth/Throat:     Pharynx: Oropharynx is clear. Posterior oropharyngeal erythema present. No oropharyngeal exudate.     Tonsils: No tonsillar exudate. 2+ on the right. 2+ on the left.     Comments: Smooth erythema posterior pharynx. Tonsils are 2+ bilaterally without exudate. On exam, uvula is midline, she is tolerating her secretions without difficulty, there is no trismus, no drooling, she has normal phonation  Cardiovascular:     Rate and Rhythm: Normal rate and regular rhythm.     Heart sounds: Normal heart sounds.  Pulmonary:     Effort: Pulmonary effort is normal.     Breath sounds: Normal breath sounds.  Lymphadenopathy:     Cervical: Cervical adenopathy present.     Right cervical: Superficial cervical adenopathy present.     Left cervical: Superficial cervical adenopathy present.  Neurological:     General: No focal deficit present.     Mental Status: She is alert and oriented to person, place, and time.  Psychiatric:        Mood and  Affect: Mood normal.        Behavior: Behavior normal.        Thought Content: Thought content normal.        Judgment: Judgment normal.     UC Treatments / Results  Labs (all labs ordered are listed, but only abnormal results are displayed) Labs Reviewed  CULTURE, GROUP A STREP [facility])  POCT RAPID STREP A (OFFICE)    EKG   Radiology No results found.  Procedures Procedures (including critical care time)  Medications Ordered in UC Medications - No data to display  Initial Impression / Assessment and Plan / UC Course  I have reviewed the triage vital signs and the nursing notes.  Pertinent labs & imaging results that were available during my care of the patient were reviewed by me and considered in my medical decision making (see chart for details).     This patient is a very pleasant 36 y.o. year old female presenting with strep pharyngitis following exposure to this at home. Febrile at 100.6, borderline tachy.  Rapid strep negative, culture sent. Suspect this was a false negative given presentation and exposure. Centor score 4. Amoxicillin as below.   ED return precautions discussed. Patient verbalizes understanding and agreement.   Coding Level 4 for acute illness with systemic symptoms, and prescription drug management   Final Clinical Impressions(s) / UC Diagnoses   Final diagnoses:  Strep pharyngitis  Screening for streptococcal infection  Exposure to strep throat     Discharge Instructions      -Start the antibiotic-Amoxicillin, 1 pill every 12 hours for 10 days.  You can take this with food like with breakfast and dinner. -You can continue tylenol/ibuprofen for discomfort, and make sure to  drink plenty of fluids -You'll still be contagious for 24 hours after starting the antibiotic. This means you can go back to work in 1 day.  -Make sure to throw out your toothbrush after 24 hours so you don't give the strep back to yourself.  -Seek additional medical  attention if symptoms are getting worse instead of better- trouble swallowing, shortness of breath, voice changes, etc.    ED Prescriptions     Medication Sig Dispense Auth. Provider   amoxicillin (AMOXIL) 500 MG capsule Take 1 capsule (500 mg total) by mouth in the morning and at bedtime for 10 days. 20 capsule Hazel Sams, PA-C      PDMP not reviewed this encounter.   Hazel Sams, PA-C 05/14/21 [phone]

## 2021-05-14 NOTE — Discharge Instructions (Addendum)
-  Start the antibiotic-Amoxicillin, 1 pill every 12 hours for 10 days.  You can take this with food like with breakfast and dinner. ?-You can continue tylenol/ibuprofen for discomfort, and make sure to drink plenty of fluids ?-You'll still be contagious for 24 hours after starting the antibiotic. This means you can go back to work in 1 day.  ?-Make sure to throw out your toothbrush after 24 hours so you don't give the strep back to yourself.  ?-Seek additional medical attention if symptoms are getting worse instead of better- trouble swallowing, shortness of breath, voice changes, etc. ? ?

## 2021-05-15 ENCOUNTER — Ambulatory Visit: Payer: Self-pay

## 2021-05-18 LAB — CULTURE, GROUP A STREP (THRC)

## 2021-05-24 ENCOUNTER — Other Ambulatory Visit: Payer: Self-pay

## 2021-05-24 ENCOUNTER — Ambulatory Visit (INDEPENDENT_AMBULATORY_CARE_PROVIDER_SITE_OTHER): Payer: BLUE CROSS/BLUE SHIELD | Admitting: Nurse Practitioner

## 2021-05-24 ENCOUNTER — Encounter: Payer: Self-pay | Admitting: Nurse Practitioner

## 2021-05-24 ENCOUNTER — Inpatient Hospital Stay (HOSPITAL_COMMUNITY): Admit: 2021-05-24 | Payer: BLUE CROSS/BLUE SHIELD

## 2021-05-24 VITALS — BP 103/65 | HR 86 | Temp 97.8°F | Ht 63.0 in | Wt 190.5 lb

## 2021-05-24 DIAGNOSIS — Z0000 Encounter for general adult medical examination without abnormal findings: Secondary | ICD-10-CM

## 2021-05-24 DIAGNOSIS — Z8632 Personal history of gestational diabetes: Secondary | ICD-10-CM

## 2021-05-24 DIAGNOSIS — Z01419 Encounter for gynecological examination (general) (routine) without abnormal findings: Secondary | ICD-10-CM

## 2021-05-24 DIAGNOSIS — Z6833 Body mass index (BMI) 33.0-33.9, adult: Secondary | ICD-10-CM | POA: Diagnosis not present

## 2021-05-24 NOTE — Progress Notes (Signed)
Established patient visit   Patient: Cindy Walker   DOB: 07/28/1985   36 y.o. Female  MRN: [id number] Visit Date: 05/24/2021   Chief Complaint  Patient presents with   Annual Exam   Gynecologic Exam   Subjective    Gynecologic Exam Pertinent negatives include no abdominal pain, back pain, chills, constipation, diarrhea, dysuria, fever, flank pain, frequency, headaches, nausea, rash, sore throat, urgency or vomiting.   The patient is here for annual wellness visit and pap smear.  -due to have routine, fasting labs. Unable to fast completely as she is still nursing an infant. -in the process of weaning from nursing her infant.  -Has been doing an eight week fitness plan which she finished this on past Friday. She has had a ten pound weight loss since her most recent visit with me. She will be starting another 8 week fitness challenge which starts next week.  She has no new concerns or complaints.    Medications: Outpatient Medications Prior to Visit  Medication Sig   Albuterol Sulfate (PROAIR RESPICLICK) 108 (90 Base) MCG/ACT AEPB Inhale into the lungs.   cyclobenzaprine (FLEXERIL) 10 MG tablet Take 10 mg by mouth 3 (three) times daily as needed for muscle spasms.   ketorolac (TORADOL) 10 MG tablet Take 10 mg by mouth every 6 (six) hours as needed.   [DISCONTINUED] amoxicillin (AMOXIL) 500 MG capsule Take 1 capsule (500 mg total) by mouth in the morning and at bedtime for 10 days.   [DISCONTINUED] Prenatal Vit-Fe Fumarate-FA (MULTIVITAMIN-PRENATAL) 27-0.8 MG TABS tablet Take 1 tablet by mouth daily at 12 noon.   No facility-administered medications prior to visit.    Review of Systems  Constitutional:  Negative for activity change, appetite change, chills, fatigue and fever.       Ten pound weight loss since her most recent visit here.   HENT:  Negative for congestion, postnasal drip, rhinorrhea, sinus pressure, sinus pain, sneezing and sore throat.   Eyes: Negative.    Respiratory:  Negative for cough, chest tightness, shortness of breath and wheezing.   Cardiovascular:  Negative for chest pain and palpitations.  Gastrointestinal:  Negative for abdominal pain, constipation, diarrhea, nausea and vomiting.  Endocrine: Negative for cold intolerance, heat intolerance, polydipsia and polyuria.  Genitourinary:  Negative for dyspareunia, dysuria, flank pain, frequency and urgency.  Musculoskeletal:  Negative for arthralgias, back pain and myalgias.  Skin:  Negative for rash.  Allergic/Immunologic: Negative for environmental allergies.  Neurological:  Negative for dizziness, weakness and headaches.  Hematological:  Negative for adenopathy.  Psychiatric/Behavioral:  The patient is not nervous/anxious.       Objective     Today's Vitals   05/24/21 1043  BP: 103/65  Pulse: 86  Temp: 97.8 F (36.6 C)  SpO2: 97%  Weight: 190 lb 8 oz (86.4 kg)  Height: 5\' 3"  (1.6 m)   Body mass index is 33.75 kg/m.   BP Readings from Last 3 Encounters:  05/24/21 103/65  05/14/21 118/79  02/19/21 126/81    Wt Readings from Last 3 Encounters:  05/24/21 190 lb 8 oz (86.4 kg)  02/21/21 200 lb (90.7 kg)  06/30/20 178 lb (80.7 kg)    Physical Exam Vitals and nursing note reviewed. Exam conducted with a chaperone present.  Constitutional:      Appearance: Normal appearance. She is well-developed.  HENT:     Head: Normocephalic and atraumatic.     Right Ear: Tympanic membrane, ear canal and external ear normal.  Left Ear: Tympanic membrane, ear canal and external ear normal.     Nose: Nose normal.     Mouth/Throat:     Mouth: Mucous membranes are moist.     Pharynx: Oropharynx is clear.  Eyes:     Extraocular Movements: Extraocular movements intact.     Conjunctiva/sclera: Conjunctivae normal.     Pupils: Pupils are equal, round, and reactive to light.  Cardiovascular:     Rate and Rhythm: Normal rate and regular rhythm.     Pulses: Normal pulses.      Heart sounds: Normal heart sounds.  Pulmonary:     Effort: Pulmonary effort is normal.     Breath sounds: Normal breath sounds.  Chest:  Breasts:    Right: Normal. No swelling, bleeding, inverted nipple, mass, nipple discharge, skin change or tenderness.     Left: Normal. No swelling, bleeding, inverted nipple, mass, nipple discharge, skin change or tenderness.  Abdominal:     General: Bowel sounds are normal. There is no distension.     Palpations: Abdomen is soft. There is no mass.     Tenderness: There is no abdominal tenderness. There is no guarding or rebound.     Hernia: No hernia is present. There is no hernia in the left inguinal area or right inguinal area.  Genitourinary:    General: Normal vulva.     Exam position: Supine.     Labia:        Right: No rash, tenderness or lesion.        Left: No rash, tenderness or lesion.      Vagina: No signs of injury and foreign body. No vaginal discharge, erythema, tenderness or bleeding.     Cervix: No cervical motion tenderness, discharge, friability, lesion, erythema or cervical bleeding.     Uterus: Not deviated, not enlarged, not fixed and not tender.      Adnexa: Right adnexa normal and left adnexa normal.     Comments: No tenderness, masses, or organomeglay present during bimanual exam .   Musculoskeletal:        General: Normal range of motion.     Cervical back: Normal range of motion and neck supple.  Lymphadenopathy:     Cervical: No cervical adenopathy.     Upper Body:     Right upper body: No axillary adenopathy.     Left upper body: No axillary adenopathy.     Lower Body: No right inguinal adenopathy. No left inguinal adenopathy.  Skin:    General: Skin is warm and dry.     Capillary Refill: Capillary refill takes less than 2 seconds.  Neurological:     General: No focal deficit present.     Mental Status: She is alert and oriented to person, place, and time.  Psychiatric:        Mood and Affect: Mood normal.         Behavior: Behavior normal.        Thought Content: Thought content normal.        Judgment: Judgment normal.     Results for orders placed or performed in visit on 05/24/21  Comp Met (CMET)  Result Value Ref Range   Glucose 91 70 - 99 mg/dL   BUN 14 6 - 20 mg/dL   Creatinine, Ser 1.61 0.57 - 1.00 mg/dL   eGFR 096 >04 VW/UJW/1.19   BUN/Creatinine Ratio 19 9 - 23   Sodium 138 134 - 144 mmol/L   Potassium 4.1 3.5 -  5.2 mmol/L   Chloride 101 96 - 106 mmol/L   CO2 24 20 - 29 mmol/L   Calcium 9.5 8.7 - 10.2 mg/dL   Total Protein 7.1 6.0 - 8.5 g/dL   Albumin 4.7 3.8 - 4.8 g/dL   Globulin, Total 2.4 1.5 - 4.5 g/dL   Albumin/Globulin Ratio 2.0 1.2 - 2.2   Bilirubin Total <0.2 0.0 - 1.2 mg/dL   Alkaline Phosphatase 63 44 - 121 IU/L   AST 14 0 - 40 IU/L   ALT 20 0 - 32 IU/L  Hemoglobin A1c  Result Value Ref Range   Hgb A1c MFr Bld 5.4 4.8 - 5.6 %   Est. average glucose Bld gHb Est-mCnc 108 mg/dL  Lipid panel  Result Value Ref Range   Cholesterol, Total 186 100 - 199 mg/dL   Triglycerides 696 (H) 0 - 149 mg/dL   HDL 38 (L) >29 mg/dL   VLDL Cholesterol Cal 35 5 - 40 mg/dL   LDL Chol Calc (NIH) 528 (H) 0 - 99 mg/dL   Chol/HDL Ratio 4.9 (H) 0.0 - 4.4 ratio  TSH  Result Value Ref Range   TSH 1.680 0.450 - 4.500 uIU/mL  CBC  Result Value Ref Range   WBC 7.4 3.4 - 10.8 x10E3/uL   RBC 4.35 3.77 - 5.28 x10E6/uL   Hemoglobin 12.4 11.1 - 15.9 g/dL   Hematocrit 41.3 24.4 - 46.6 %   MCV 85 79 - 97 fL   MCH 28.5 26.6 - 33.0 pg   MCHC 33.4 31.5 - 35.7 g/dL   RDW 01.0 27.2 - 53.6 %   Platelets 389 150 - 450 x10E3/uL  Cytology - PAP( [facility])  Result Value Ref Range   High risk HPV Negative    Adequacy      Satisfactory for evaluation; transformation zone component PRESENT.   Diagnosis      - Negative for intraepithelial lesion or malignancy (NILM)   Comment Normal Reference Range HPV - Negative     Assessment & Plan    1. Well woman exam Annual wellness visit with pap smear  obtained during today's visit. Routine, fasting labs drawn during today's visit.  - Cytology - PAP( [facility])  2. Body mass index (BMI) of 33.0-33.9 in adult Discussed lowering calorie intake to 1500 calories per day and incorporating exercise into daily routine to help lose weight. Will discuss medication management to help with weight loss after she is no longer breast feeding her new baby   3. History of gestational diabetes Check HgbA1c along with routine labs today.   4. Healthcare maintenance Routine, fasting labs drawn during today's visit.  - TSH; Future - Lipid panel; Future - Hemoglobin A1c; Future - Comp Met (CMET); Future - CBC; Future - Comp Met (CMET) - Hemoglobin A1c - Lipid panel - TSH - CBC    Problem List Items Addressed This Visit       Other   Body mass index (BMI) of 33.0-33.9 in adult   History of gestational diabetes   Other Visit Diagnoses     Well woman exam    -  Primary   Relevant Orders   Cytology - PAP( [facility]) (Completed)   Healthcare maintenance       Relevant Orders   TSH (Completed)   Lipid panel (Completed)   Hemoglobin A1c (Completed)   Comp Met (CMET) (Completed)   CBC (Completed)        Return in about 4 months (around 09/23/2021) for  routine - weight management.         Carlean Jews, NP  [facility] at [facility] [phone] (phone) [phone] (fax)  [facility]

## 2021-05-25 LAB — COMPREHENSIVE METABOLIC PANEL
ALT: 20 IU/L (ref 0–32)
AST: 14 IU/L (ref 0–40)
Albumin/Globulin Ratio: 2 (ref 1.2–2.2)
Albumin: 4.7 g/dL (ref 3.8–4.8)
Alkaline Phosphatase: 63 IU/L (ref 44–121)
BUN/Creatinine Ratio: 19 (ref 9–23)
BUN: 14 mg/dL (ref 6–20)
Bilirubin Total: <0.2 mg/dL (ref 0.0–1.2)
CO2: 24 mmol/L (ref 20–29)
Calcium: 9.5 mg/dL (ref 8.7–10.2)
Chloride: 101 mmol/L (ref 96–106)
Creatinine, Ser: 0.73 mg/dL (ref 0.57–1.00)
Globulin, Total: 2.4 g/dL (ref 1.5–4.5)
Glucose: 91 mg/dL (ref 70–99)
Potassium: 4.1 mmol/L (ref 3.5–5.2)
Sodium: 138 mmol/L (ref 134–144)
Total Protein: 7.1 g/dL (ref 6.0–8.5)
eGFR: 109 mL/min/{1.73_m2} (ref >59)

## 2021-05-25 LAB — CBC
Hematocrit: 37.1 % (ref 34.0–46.6)
Hemoglobin: 12.4 g/dL (ref 11.1–15.9)
MCH: 28.5 pg (ref 26.6–33.0)
MCHC: 33.4 g/dL (ref 31.5–35.7)
MCV: 85 fL (ref 79–97)
Platelets: 389 10*3/uL (ref 150–450)
RBC: 4.35 x10E6/uL (ref 3.77–5.28)
RDW: 13.6 % (ref 11.7–15.4)
WBC: 7.4 10*3/uL (ref 3.4–10.8)

## 2021-05-25 LAB — LIPID PANEL
Chol/HDL Ratio: 4.9 ratio — ABNORMAL HIGH (ref 0.0–4.4)
Cholesterol, Total: 186 mg/dL (ref 100–199)
HDL: 38 mg/dL — ABNORMAL LOW (ref >39)
LDL Chol Calc (NIH): 113 mg/dL — ABNORMAL HIGH (ref 0–99)
Triglycerides: 198 mg/dL — ABNORMAL HIGH (ref 0–149)
VLDL Cholesterol Cal: 35 mg/dL (ref 5–40)

## 2021-05-25 LAB — HEMOGLOBIN A1C
Est. average glucose Bld gHb Est-mCnc: 108 mg/dL
Hgb A1c MFr Bld: 5.4 % (ref 4.8–5.6)

## 2021-05-25 LAB — TSH: TSH: 1.68 u[IU]/mL (ref 0.450–4.500)

## 2021-05-26 LAB — CYTOLOGY - PAP
Comment: NEGATIVE
Diagnosis: NEGATIVE
High risk HPV: NEGATIVE

## 2021-05-30 NOTE — Patient Instructions (Signed)
Fat and Cholesterol Restricted Eating Plan Getting too much fat and cholesterol in your diet may cause health problems. Choosing the right foods helps keep your fat and cholesterol at normal levels. This can keep you from getting certain diseases. Your doctor may recommend an eating plan that includes: Total fat: ______% or less of total calories a day. This is ______g of fat a day. Saturated fat: ______% or less of total calories a day. This is ______g of saturated fat a day. Cholesterol: less than _________mg a day. Fiber: ______g a day. What are tips for following this plan? General tips Work with your doctor to lose weight if you need to. Avoid: Foods with added sugar. Fried foods. Foods with trans fat or partially hydrogenated oils. This includes some margarines and baked goods. If you drink alcohol: Limit how much you have to: 0-1 drink a day for women who are not pregnant. 0-2 drinks a day for men. Know how much alcohol is in a drink. In the [place] one drink equals one 12 oz bottle of beer (355 mL), one 5 oz glass of wine (148 mL), or one 1 oz glass of hard liquor (44 mL). Reading food labels Check food labels for: Trans fats. Partially hydrogenated oils. Saturated fat (g) in each serving. Cholesterol (mg) in each serving. Fiber (g) in each serving. Choose foods with healthy fats, such as: Monounsaturated fats and polyunsaturated fats. These include olive and canola oil, flaxseeds, walnuts, almonds, and seeds. Omega-3 fats. These are found in certain fish, flaxseed oil, and ground flaxseeds. Choose grain products that have whole grains. Look for the word "whole" as the first word in the ingredient list. Cooking Cook foods using low-fat methods. These include baking, boiling, grilling, and broiling. Eat more home-cooked foods. Eat at restaurants and buffets less often. Eat less fast food. Avoid cooking using saturated fats, such as butter, cream, palm oil, palm kernel oil, and  coconut oil. Meal planning  At meals, divide your plate into four equal parts: Fill one-half of your plate with vegetables, green salads, and fruit. Fill one-fourth of your plate with whole grains. Fill one-fourth of your plate with low-fat (lean) protein foods. Eat fish that is high in omega-3 fats at least two times a week. This includes mackerel, tuna, sardines, and salmon. Eat foods that are high in fiber, such as whole grains, beans, apples, pears, berries, broccoli, carrots, peas, and barley. What foods should I eat? Fruits All fresh, canned (in natural juice), or frozen fruits. Vegetables Fresh or frozen vegetables (raw, steamed, roasted, or grilled). Green salads. Grains Whole grains, such as whole wheat or whole grain breads, crackers, cereals, and pasta. Unsweetened oatmeal, bulgur, barley, quinoa, or brown rice. Corn or whole wheat flour tortillas. Meats and other protein foods Ground beef (85% or leaner), grass-fed beef, or beef trimmed of fat. Skinless chicken or turkey. Ground chicken or turkey. Pork trimmed of fat. All fish and seafood. Egg whites. Dried beans, peas, or lentils. Unsalted nuts or seeds. Unsalted canned beans. Nut butters without added sugar or oil. Dairy Low-fat or nonfat dairy products, such as skim or 1% milk, 2% or reduced-fat cheeses, low-fat and fat-free ricotta or cottage cheese, or plain low-fat and nonfat yogurt. Fats and oils Tub margarine without trans fats. Light or reduced-fat mayonnaise and salad dressings. Avocado. Olive, canola, sesame, or safflower oils. The items listed above may not be a complete list of foods and beverages you can eat. Contact a dietitian for more information. What foods   should I avoid? Fruits Canned fruit in heavy syrup. Fruit in cream or butter sauce. Fried fruit. Vegetables Vegetables cooked in cheese, cream, or butter sauce. Fried vegetables. Grains White bread. White pasta. White rice. Cornbread. Bagels, pastries,  and croissants. Crackers and snack foods that contain trans fat and hydrogenated oils. Meats and other protein foods Fatty cuts of meat. Ribs, chicken wings, bacon, sausage, bologna, salami, chitterlings, fatback, hot dogs, bratwurst, and packaged lunch meats. Liver and organ meats. Whole eggs and egg yolks. Chicken and turkey with skin. Fried meat. Dairy Whole or 2% milk, cream, half-and-half, and cream cheese. Whole milk cheeses. Whole-fat or sweetened yogurt. Full-fat cheeses. Nondairy creamers and whipped toppings. Processed cheese, cheese spreads, and cheese curds. Fats and oils Butter, stick margarine, lard, shortening, ghee, or bacon fat. Coconut, palm kernel, and palm oils. Beverages Alcohol. Sugar-sweetened drinks such as sodas, lemonade, and fruit drinks. Sweets and desserts Corn syrup, sugars, honey, and molasses. Candy. Jam and jelly. Syrup. Sweetened cereals. Cookies, pies, cakes, donuts, muffins, and ice cream. The items listed above may not be a complete list of foods and beverages you should avoid. Contact a dietitian for more information. Summary Choosing the right foods helps keep your fat and cholesterol at normal levels. This can keep you from getting certain diseases. At meals, fill one-half of your plate with vegetables, green salads, and fruits. Eat high fiber foods, like whole grains, beans, apples, pears, berries, carrots, peas, and barley. Limit added sugar, saturated fats, alcohol, and fried foods. This information is not intended to replace advice given to you by your health care provider. Make sure you discuss any questions you have with your health care provider. Document Revised: 07/17/2020 Document Reviewed: 07/17/2020 Elsevier Patient Education  2022 [employer] Inc[employer]  

## 2021-05-31 ENCOUNTER — Encounter: Payer: Self-pay | Admitting: Nurse Practitioner

## 2021-07-20 ENCOUNTER — Ambulatory Visit (INDEPENDENT_AMBULATORY_CARE_PROVIDER_SITE_OTHER): Payer: BLUE CROSS/BLUE SHIELD | Admitting: Nurse Practitioner

## 2021-07-20 ENCOUNTER — Encounter: Payer: Self-pay | Admitting: Nurse Practitioner

## 2021-07-20 VITALS — BP 116/74 | HR 68 | Temp 97.7°F | Ht 63.0 in | Wt 188.0 lb

## 2021-07-20 DIAGNOSIS — Z6833 Body mass index (BMI) 33.0-33.9, adult: Secondary | ICD-10-CM | POA: Diagnosis not present

## 2021-07-20 DIAGNOSIS — E785 Hyperlipidemia, unspecified: Secondary | ICD-10-CM | POA: Insufficient documentation

## 2021-07-20 MED ORDER — PHENTERMINE HCL 37.5 MG PO TABS: 37.5000 mg | ORAL_TABLET | Freq: Every day | ORAL | 0 refills | Status: DC

## 2021-07-20 NOTE — Progress Notes (Signed)
Established patient visit ? ? ?Patient: Cindy Walker   DOB: 11/12/1985   36 y.o. Female  MRN: [id number] ?Visit Date: 07/20/2021 ? ? ?No chief complaint on file. ? ?Subjective  ?  ?HPI  ?Follow up  ?-discuss weight management ?-recently stopped nursing her third baby.  ?-has lost 12 pounds since she started coming to this office.  ?-has taken phentermine in the past. She did very well with this. Took for just a few months and was able to come off and maintain her weight until she became pregnant.  ?-routine, fasting labs done prior to this visit.  ?--mild, generalized elevation of cholesterol.  ?--other labs good.  ? ? ?Medications: ?Outpatient Medications Prior to Visit  ?Medication Sig  ? Albuterol Sulfate (PROAIR RESPICLICK) 403 (90 Base) MCG/ACT AEPB Inhale into the lungs.  ? cyclobenzaprine (FLEXERIL) 10 MG tablet Take 10 mg by mouth 3 (three) times daily as needed for muscle spasms.  ? ketorolac (TORADOL) 10 MG tablet Take 10 mg by mouth every 6 (six) hours as needed.  ? ?No facility-administered medications prior to visit.  ? ? ?Review of Systems  ?Constitutional:  Negative for activity change, appetite change, chills, fatigue and fever.  ?     Weight loss two pounds since her last visit   ?HENT:  Negative for congestion, postnasal drip, rhinorrhea, sinus pressure, sinus pain, sneezing and sore throat.   ?Eyes: Negative.   ?Respiratory:  Negative for cough, chest tightness, shortness of breath and wheezing.   ?Cardiovascular:  Negative for chest pain and palpitations.  ?Gastrointestinal:  Negative for abdominal pain, constipation, diarrhea, nausea and vomiting.  ?Endocrine: Negative for cold intolerance, heat intolerance, polydipsia and polyuria.  ?Genitourinary:  Negative for dyspareunia, dysuria, flank pain, frequency and urgency.  ?Musculoskeletal:  Negative for arthralgias, back pain and myalgias.  ?Skin:  Negative for rash.  ?Allergic/Immunologic: Negative for environmental allergies.   ?Neurological:  Negative for dizziness, weakness and headaches.  ?Hematological:  Negative for adenopathy.  ?Psychiatric/Behavioral:  The patient is not nervous/anxious.   ? ?Last CBC ?Lab Results  ?Component Value Date  ? WBC 7.4 05/24/2021  ? HGB 12.4 05/24/2021  ? HCT 37.1 05/24/2021  ? MCV 85 05/24/2021  ? MCH 28.5 05/24/2021  ? RDW 13.6 05/24/2021  ? PLT 389 05/24/2021  ? ?Last metabolic panel ?Lab Results  ?Component Value Date  ? GLUCOSE 91 05/24/2021  ? NA 138 05/24/2021  ? K 4.1 05/24/2021  ? CL 101 05/24/2021  ? CO2 24 05/24/2021  ? BUN 14 05/24/2021  ? CREATININE 0.73 05/24/2021  ? EGFR 109 05/24/2021  ? CALCIUM 9.5 05/24/2021  ? PROT 7.1 05/24/2021  ? ALBUMIN 4.7 05/24/2021  ? LABGLOB 2.4 05/24/2021  ? AGRATIO 2.0 05/24/2021  ? BILITOT <0.2 05/24/2021  ? ALKPHOS 63 05/24/2021  ? AST 14 05/24/2021  ? ALT 20 05/24/2021  ? ANIONGAP 13 01/22/2020  ? ?Last lipids ?Lab Results  ?Component Value Date  ? CHOL 186 05/24/2021  ? HDL 38 (L) 05/24/2021  ? LDLCALC 113 (H) 05/24/2021  ? TRIG 198 (H) 05/24/2021  ? CHOLHDL 4.9 (H) 05/24/2021  ? ?Last hemoglobin A1c ?Lab Results  ?Component Value Date  ? HGBA1C 5.4 05/24/2021  ? ?Last thyroid functions ?Lab Results  ?Component Value Date  ? TSH 1.680 05/24/2021  ? T3TOTAL 126 05/31/2018  ? ?Last vitamin D ?Lab Results  ?Component Value Date  ? VD25OH 20.1 (L) 05/31/2018  ? ?  ? ? Objective  ?  ? ?Today's  Vitals  ? 07/20/21 1032  ?BP: 116/74  ?Pulse: 68  ?Temp: 97.7 ?F (36.5 ?C)  ?SpO2: 100%  ?Weight: 188 lb (85.3 kg)  ?Height: _0  (1.6 m)  ? ?Body mass index is 33.3 kg/m?.  ? ?BP Readings from Last 3 Encounters:  ?07/20/21 116/74  ?05/24/21 103/65  ?05/14/21 118/79  ?  ?Wt Readings from Last 3 Encounters:  ?07/20/21 188 lb (85.3 kg)  ?05/24/21 190 lb 8 oz (86.4 kg)  ?02/21/21 200 lb (90.7 kg)  ?  ?Physical Exam ?Vitals and nursing note reviewed.  ?Constitutional:   ?   Appearance: Normal appearance. She is well-developed.  ?HENT:  ?   Head: Normocephalic and  atraumatic.  ?Eyes:  ?   Pupils: Pupils are equal, round, and reactive to light.  ?Cardiovascular:  ?   Rate and Rhythm: Normal rate and regular rhythm.  ?   Pulses: Normal pulses.  ?   Heart sounds: Normal heart sounds.  ?Pulmonary:  ?   Effort: Pulmonary effort is normal.  ?   Breath sounds: Normal breath sounds.  ?Abdominal:  ?   Palpations: Abdomen is soft.  ?Musculoskeletal:     ?   General: Normal range of motion.  ?   Cervical back: Normal range of motion and neck supple.  ?Lymphadenopathy:  ?   Cervical: No cervical adenopathy.  ?Skin: ?   General: Skin is warm and dry.  ?   Capillary Refill: Capillary refill takes less than 2 seconds.  ?Neurological:  ?   General: No focal deficit present.  ?   Mental Status: She is alert and oriented to person, place, and time.  ?Psychiatric:     ?   Mood and Affect: Mood normal.     ?   Behavior: Behavior normal.     ?   Thought Content: Thought content normal.     ?   Judgment: Judgment normal.  ?  ? ? Assessment & Plan  ?  ?1. Hyperlipidemia LDL goal <100 ?Reviewed labs showing elevation of triglycerides, low HDL, and high LDL, with normal total cholesterol. Recommend she limit intake of fried and fatty foods. She should increase intake of lean proteins and green leafy vegetables. Adding exercise into daily routine will also be beneficial.  Recheck lasting lipid panel in 6 months and treat as indicated.  ? ?2. Body mass index (BMI) of 33.0-33.9 in adult ?Discussed lowering calorie intake to 1500 calories per day and incorporating exercise into daily routine to help lose weight. Start phentermine 37.4m daily. Reassess in one month.  ?- phentermine (ADIPEX-P) 37.5 MG tablet; Take 1 tablet (37.5 mg total) by mouth daily before breakfast.  Dispense: 30 tablet; Refill: 0  ? ?Problem List Items Addressed This Visit   ? ?  ? Other  ? Body mass index (BMI) of 33.0-33.9 in adult  ? Relevant Medications  ? phentermine (ADIPEX-P) 37.5 MG tablet  ? Hyperlipidemia LDL goal <100 -  Primary  ?  ? ?Return in about 4 weeks (around 08/17/2021) for Weight check.  ?   ? ? ? ? ?HRonnell Freshwater NP  ?[facility]?[phone](phone) ?[phone](fax) ? ?[facility]  ?

## 2021-07-20 NOTE — Patient Instructions (Addendum)
Calorie Counting for Weight Loss ?Calories are units of energy. Your body needs a certain number of calories from food to keep going throughout the day. When you eat or drink more calories than your body needs, your body stores the extra calories mostly as fat. When you eat or drink fewer calories than your body needs, your body burns fat to get the energy it needs. ?Calorie counting means keeping track of how many calories you eat and drink each day. Calorie counting can be helpful if you need to lose weight. If you eat fewer calories than your body needs, you should lose weight. Ask your health care provider what a healthy weight is for you. ?For calorie counting to work, you will need to eat the right number of calories each day to lose a healthy amount of weight per week. A dietitian can help you figure out how many calories you need in a day and will suggest ways to reach your calorie goal. ?A healthy amount of weight to lose each week is usually 1-2 lb (0.5-0.9 kg). This usually means that your daily calorie intake should be reduced by 500-750 calories. ?Eating 1,200-1,500 calories a day can help most women lose weight. ?Eating 1,500-1,800 calories a day can help most men lose weight. ?What do I need to know about calorie counting? ?Work with your health care provider or dietitian to determine how many calories you should get each day. To meet your daily calorie goal, you will need to: ?Find out how many calories are in each food that you would like to eat. Try to do this before you eat. ?Decide how much of the food you plan to eat. ?Keep a food log. Do this by writing down what you ate and how many calories it had. ?To successfully lose weight, it is important to balance calorie counting with a healthy lifestyle that includes regular activity. ?Where do I find calorie information? ? ?The number of calories in a food can be found on a Nutrition Facts label. If a food does not have a Nutrition Facts label, try  to look up the calories online or ask your dietitian for help. ?Remember that calories are listed per serving. If you choose to have more than one serving of a food, you will have to multiply the calories per serving by the number of servings you plan to eat. For example, the label on a package of bread might say that a serving size is 1 slice and that there are 90 calories in a serving. If you eat 1 slice, you will have eaten 90 calories. If you eat 2 slices, you will have eaten 180 calories. ?How do I keep a food log? ?After each time that you eat, record the following in your food log as soon as possible: ?What you ate. Be sure to include toppings, sauces, and other extras on the food. ?How much you ate. This can be measured in cups, ounces, or number of items. ?How many calories were in each food and drink. ?The total number of calories in the food you ate. ?Keep your food log near you, such as in a pocket-sized notebook or on an app or website on your mobile phone. Some programs will calculate calories for you and show you how many calories you have left to meet your daily goal. ?What are some portion-control tips? ?Know how many calories are in a serving. This will help you know how many servings you can have of a certain  food. ?Use a measuring cup to measure serving sizes. You could also try weighing out portions on a kitchen scale. With time, you will be able to estimate serving sizes for some foods. ?Take time to put servings of different foods on your favorite plates or in your favorite bowls and cups so you know what a serving looks like. ?Try not to eat straight from a food's packaging, such as from a bag or box. Eating straight from the package makes it hard to see how much you are eating and can lead to overeating. Put the amount you would like to eat in a cup or on a plate to make sure you are eating the right portion. ?Use smaller plates, glasses, and bowls for smaller portions and to prevent  overeating. ?Try not to multitask. For example, avoid watching TV or using your computer while eating. If it is time to eat, sit down at a table and enjoy your food. This will help you recognize when you are full. It will also help you be more mindful of what and how much you are eating. ?What are tips for following this plan? ?Reading food labels ?Check the calorie count compared with the serving size. The serving size may be smaller than what you are used to eating. ?Check the source of the calories. Try to choose foods that are high in protein, fiber, and vitamins, and low in saturated fat, trans fat, and sodium. ?Shopping ?Read nutrition labels while you shop. This will help you make healthy decisions about which foods to buy. ?Pay attention to nutrition labels for low-fat or fat-free foods. These foods sometimes have the same number of calories or more calories than the full-fat versions. They also often have added sugar, starch, or salt to make up for flavor that was removed with the fat. ?Make a grocery list of lower-calorie foods and stick to it. ?Cooking ?Try to cook your favorite foods in a healthier way. For example, try baking instead of frying. ?Use low-fat dairy products. ?Meal planning ?Use more fruits and vegetables. One-half of your plate should be fruits and vegetables. ?Include lean proteins, such as chicken, [place], and fish. ?Lifestyle ?Each week, aim to do one of the following: ?150 minutes of moderate exercise, such as walking. ?75 minutes of vigorous exercise, such as running. ?General information ?Know how many calories are in the foods you eat most often. This will help you calculate calorie counts faster. ?Find a way of tracking calories that works for you. Get creative. Try different apps or programs if writing down calories does not work for you. ?What foods should I eat? ? ?Eat nutritious foods. It is better to have a nutritious, high-calorie food, such as an avocado, than a food with  few nutrients, such as a bag of potato chips. ?Use your calories on foods and drinks that will fill you up and will not leave you hungry soon after eating. ?Examples of foods that fill you up are nuts and nut butters, vegetables, lean proteins, and high-fiber foods such as whole grains. High-fiber foods are foods with more than 5 g of fiber per serving. ?Pay attention to calories in drinks. Low-calorie drinks include water and unsweetened drinks. ?The items listed above may not be a complete list of foods and beverages you can eat. Contact a dietitian for more information. ?What foods should I limit? ?Limit foods or drinks that are not good sources of vitamins, minerals, or protein or that are high in unhealthy fats. These  include: ?Candy. ?Other sweets. ?Sodas, specialty coffee drinks, alcohol, and juice. ?The items listed above may not be a complete list of foods and beverages you should avoid. Contact a dietitian for more information. ?How do I count calories when eating out? ?Pay attention to portions. Often, portions are much larger when eating out. Try these tips to keep portions smaller: ?Consider sharing a meal instead of getting your own. ?If you get your own meal, eat only half of it. Before you start eating, ask for a container and put half of your meal into it. ?When available, consider ordering smaller portions from the menu instead of full portions. ?Pay attention to your food and drink choices. Knowing the way food is cooked and what is included with the meal can help you eat fewer calories. ?If calories are listed on the menu, choose the lower-calorie options. ?Choose dishes that include vegetables, fruits, whole grains, low-fat dairy products, and lean proteins. ?Choose items that are boiled, broiled, grilled, or steamed. Avoid items that are buttered, battered, fried, or served with cream sauce. Items labeled as crispy are usually fried, unless stated otherwise. ?Choose water, low-fat milk,  unsweetened iced tea, or other drinks without added sugar. If you want an alcoholic beverage, choose a lower-calorie option, such as a glass of wine or light beer. ?Ask for dressings, sauces, and syrups on the side.

## 2021-08-17 ENCOUNTER — Ambulatory Visit (INDEPENDENT_AMBULATORY_CARE_PROVIDER_SITE_OTHER): Payer: BLUE CROSS/BLUE SHIELD | Admitting: Nurse Practitioner

## 2021-08-17 ENCOUNTER — Encounter: Payer: Self-pay | Admitting: Nurse Practitioner

## 2021-08-17 VITALS — BP 127/84 | HR 95 | Temp 98.0°F | Ht 62.99 in | Wt 182.0 lb

## 2021-08-17 DIAGNOSIS — G43809 Other migraine, not intractable, without status migrainosus: Secondary | ICD-10-CM | POA: Diagnosis not present

## 2021-08-17 DIAGNOSIS — Z6833 Body mass index (BMI) 33.0-33.9, adult: Secondary | ICD-10-CM

## 2021-08-17 MED ORDER — KETOROLAC TROMETHAMINE 10 MG PO TABS: 10.0000 mg | ORAL_TABLET | Freq: Four times a day (QID) | ORAL | 2 refills | Status: DC | PRN

## 2021-08-17 MED ORDER — PHENTERMINE HCL 37.5 MG PO TABS: 37.5000 mg | ORAL_TABLET | Freq: Every day | ORAL | 1 refills | Status: DC

## 2021-08-17 NOTE — Progress Notes (Signed)
Established patient visit   Patient: Cindy Walker   DOB: [birth year]   36 y.o. Female  MRN: [id number] Visit Date: 08/17/2021   Chief Complaint  Patient presents with   Weight Check   Subjective    HPI  Follow up visit for weight management.  -started phentermine 07/20/2021 -starting weight 07/20/2021 - 188 -today's weight - 08/17/2021 - 182 -weight change since most recent visit -  6 pounds  -total weight change since starting back on phentermine - 6 pounds   -she reports no negative side effects from taking this medication.   -states that she has had increased migraine headaches since she stopped breast feeding her first child. Takes toradol as needed which does relieve her migraine headaches.   Medications: Outpatient Medications Prior to Visit  Medication Sig   Albuterol Sulfate (PROAIR RESPICLICK) 263 (90 Base) MCG/ACT AEPB Inhale into the lungs.   cyclobenzaprine (FLEXERIL) 10 MG tablet Take 10 mg by mouth 3 (three) times daily as needed for muscle spasms.   [DISCONTINUED] ketorolac (TORADOL) 10 MG tablet Take 10 mg by mouth every 6 (six) hours as needed.   [DISCONTINUED] phentermine (ADIPEX-P) 37.5 MG tablet Take 1 tablet (37.5 mg total) by mouth daily before breakfast.   No facility-administered medications prior to visit.    Review of Systems  Constitutional:  Negative for activity change, appetite change, chills, fatigue and fever.       Six pound weight gain since last visit.   HENT:  Negative for congestion, postnasal drip, rhinorrhea, sinus pressure, sinus pain, sneezing and sore throat.   Eyes: Negative.   Respiratory:  Negative for cough, chest tightness, shortness of breath and wheezing.   Cardiovascular:  Negative for chest pain and palpitations.  Gastrointestinal:  Negative for abdominal pain, constipation, diarrhea, nausea and vomiting.  Endocrine: Negative for cold intolerance, heat intolerance, polydipsia and polyuria.  Genitourinary:  Negative for  dyspareunia, dysuria, flank pain, frequency and urgency.  Musculoskeletal:  Negative for arthralgias, back pain and myalgias.  Skin:  Negative for rash.  Allergic/Immunologic: Negative for environmental allergies.  Neurological:  Positive for headaches. Negative for dizziness and weakness.  Hematological:  Negative for adenopathy.  Psychiatric/Behavioral:  The patient is not nervous/anxious.       Objective     Today's Vitals   08/17/21 0853  BP: 127/84  Pulse: 95  Temp: 98 F (36.7 C)  SpO2: 100%  Weight: 182 lb (82.6 kg)  Height: 5' 2.99" (1.6 m)   Body mass index is 32.25 kg/m.   BP Readings from Last 3 Encounters:  08/17/21 127/84  07/20/21 116/74  05/24/21 103/65    Wt Readings from Last 3 Encounters:  08/17/21 182 lb (82.6 kg)  07/20/21 188 lb (85.3 kg)  05/24/21 190 lb 8 oz (86.4 kg)    Physical Exam Vitals and nursing note reviewed.  Constitutional:      Appearance: Normal appearance. She is well-developed.  HENT:     Head: Normocephalic and atraumatic.  Eyes:     Pupils: Pupils are equal, round, and reactive to light.  Cardiovascular:     Rate and Rhythm: Normal rate and regular rhythm.     Pulses: Normal pulses.     Heart sounds: Normal heart sounds.  Pulmonary:     Effort: Pulmonary effort is normal.     Breath sounds: Normal breath sounds.  Abdominal:     Palpations: Abdomen is soft.  Musculoskeletal:        General: Normal range of  motion.     Cervical back: Normal range of motion and neck supple.  Lymphadenopathy:     Cervical: No cervical adenopathy.  Skin:    General: Skin is warm and dry.     Capillary Refill: Capillary refill takes less than 2 seconds.  Neurological:     General: No focal deficit present.     Mental Status: She is alert and oriented to person, place, and time.  Psychiatric:        Mood and Affect: Mood normal.        Behavior: Behavior normal.        Thought Content: Thought content normal.        Judgment:  Judgment normal.      Assessment & Plan    1. Other migraine without status migrainosus, not intractable Patient may take toradol '10mg'$  as needed and as prescribed for acute migraines  - ketorolac (TORADOL) 10 MG tablet; Take 1 tablet (10 mg total) by mouth every 6 (six) hours as needed.  Dispense: 20 tablet; Refill: 2  2. Body mass index (BMI) of 33.0-33.9 in adult Improved with a six pound weight loss since starting phentermine. Will continue this for next two months. Discussed lowering calorie intake to 1500 calories per day and incorporating exercise into daily routine to help lose weight.  - phentermine (ADIPEX-P) 37.5 MG tablet; Take 1 tablet (37.5 mg total) by mouth daily before breakfast.  Dispense: 30 tablet; Refill: 1   Problem List Items Addressed This Visit       Cardiovascular and Mediastinum   Migraine - Primary   Relevant Medications   ketorolac (TORADOL) 10 MG tablet     Other   Body mass index (BMI) of 33.0-33.9 in adult   Relevant Medications   phentermine (ADIPEX-P) 37.5 MG tablet     Return in about 2 months (around 10/17/2021) for routine - weight management.         Ronnell Freshwater, NP  [facility] [phone] (phone) [phone] (fax)  [place]

## 2021-09-23 ENCOUNTER — Ambulatory Visit: Payer: BLUE CROSS/BLUE SHIELD | Admitting: Nurse Practitioner

## 2021-10-18 ENCOUNTER — Ambulatory Visit: Payer: BLUE CROSS/BLUE SHIELD | Admitting: Nurse Practitioner

## 2021-11-09 ENCOUNTER — Ambulatory Visit: Payer: BLUE CROSS/BLUE SHIELD | Admitting: Nurse Practitioner

## 2022-02-21 IMAGING — MG DIGITAL DIAGNOSTIC BILAT W/ TOMO W/ CAD
6 of 10 series · 6 of 30 positions shown · non-contrast
Comparison: Previous exam(s).

CLINICAL DATA: 33-year-old female presenting with a palpable area
of concern felt by her doctor in the inferior aspect of the right
breast.

EXAM:
DIGITAL DIAGNOSTIC BILATERAL MAMMOGRAM WITH CAD AND TOMO
ULTRASOUND RIGHT BREAST

[R CC synth-2D]
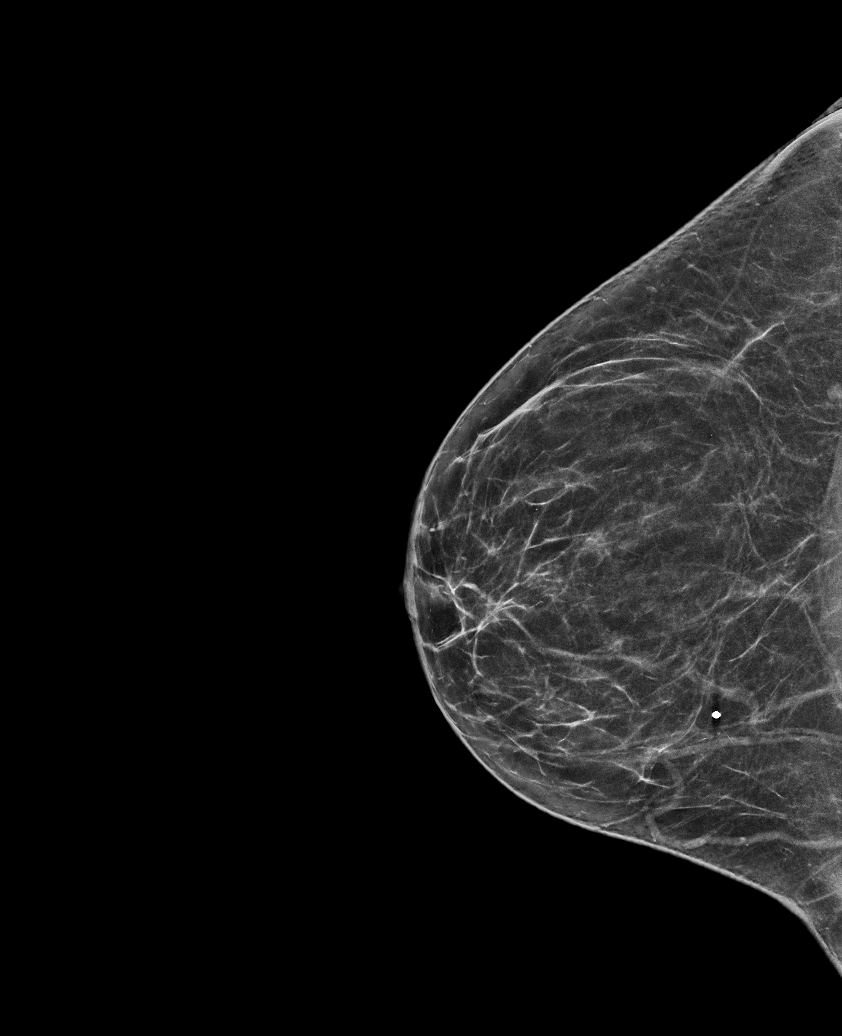

[L MLO synth-2D]
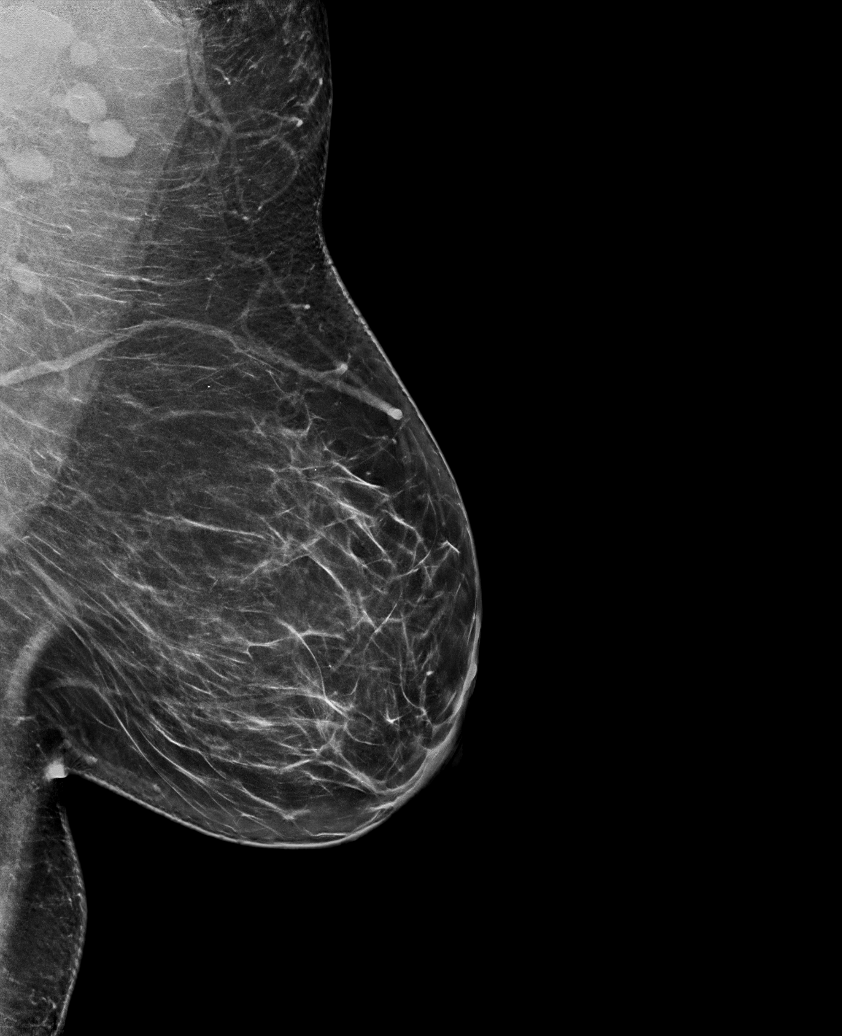

[R MLO synth-2D]
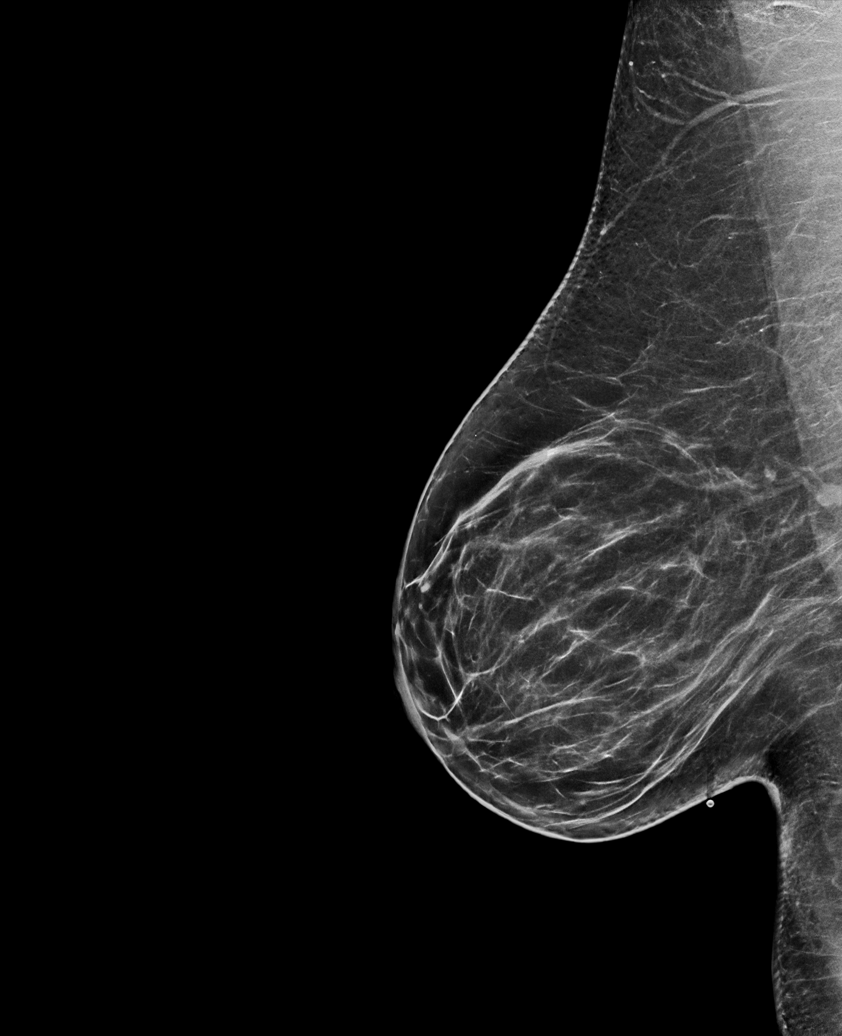

[R TAN synth-2D]
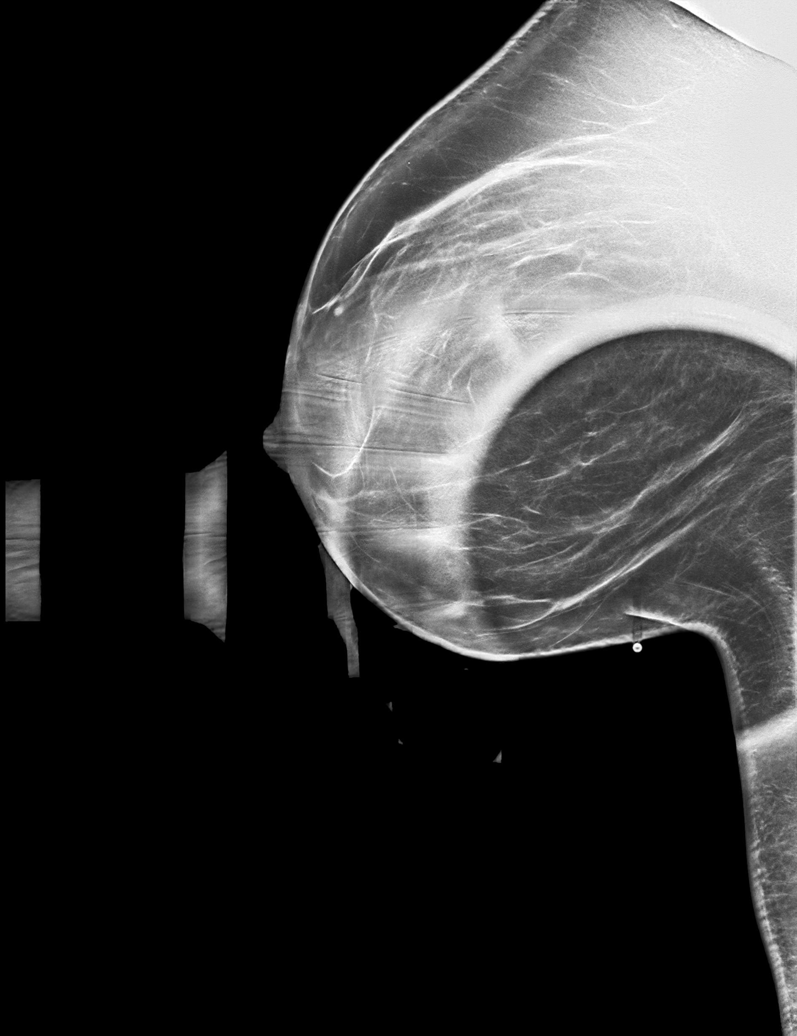

[L CC synth-2D]
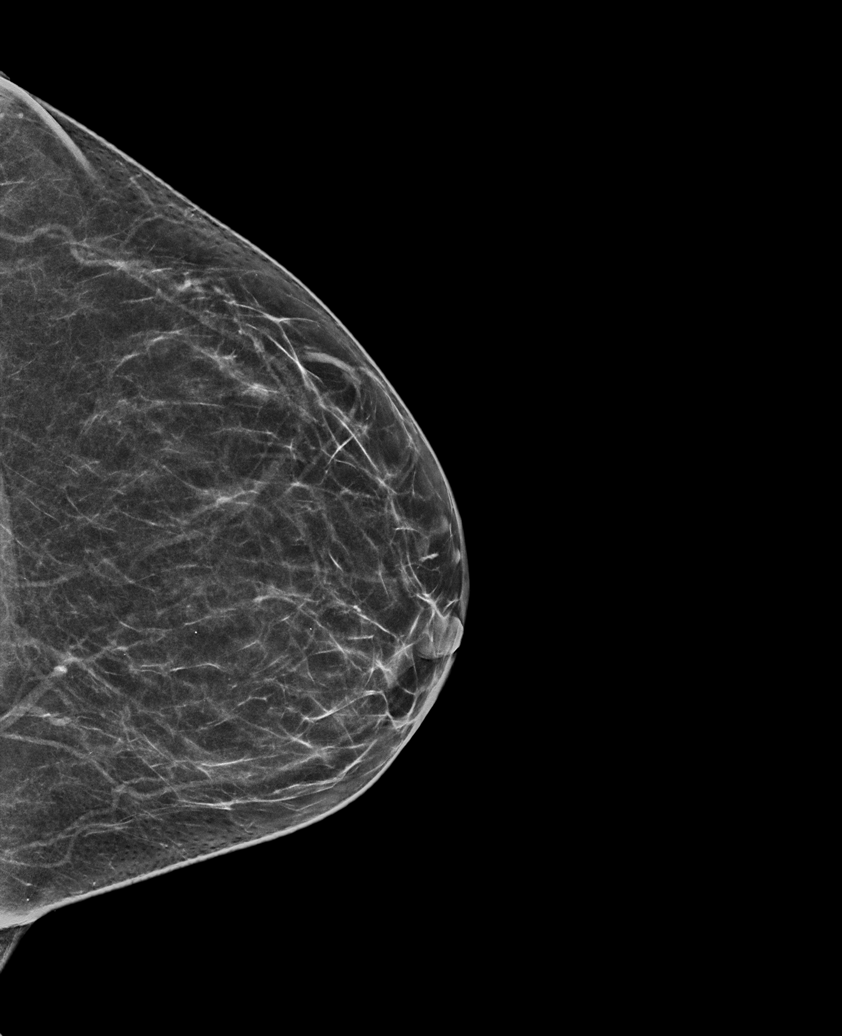

[L CC tomo · tomo slice 31/61.0]
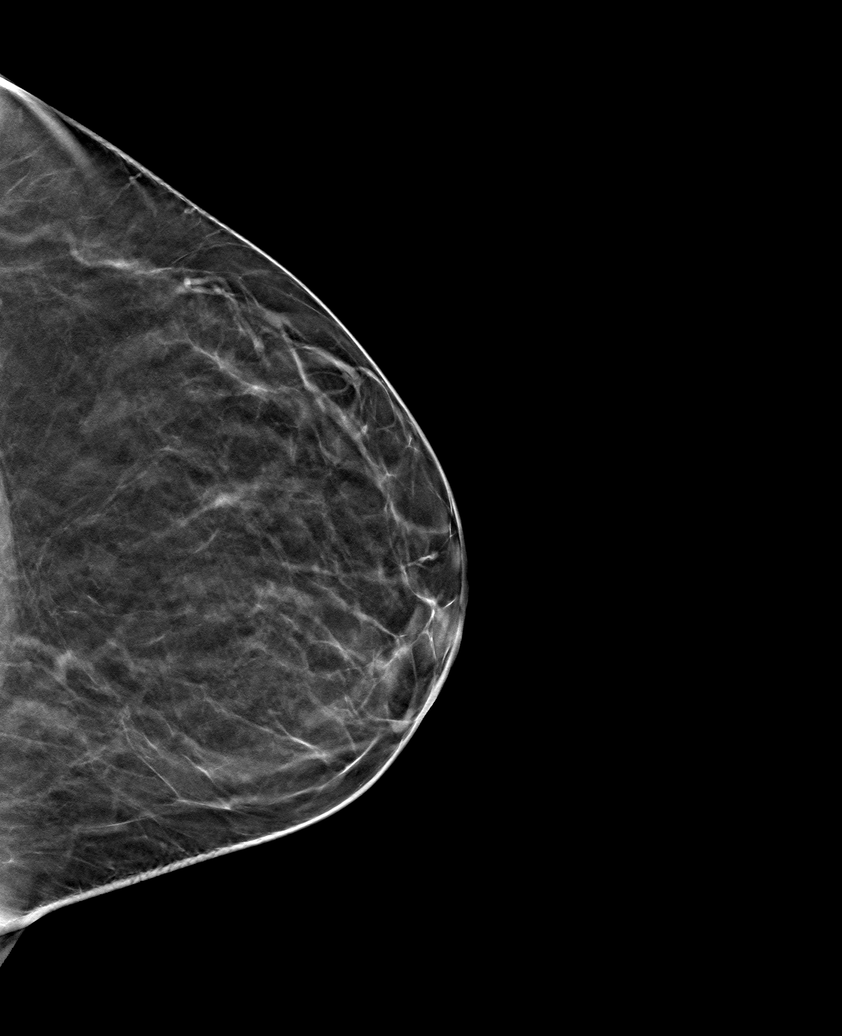

[6 of 30 positions shown; findings below may reference images not displayed]

ACR Breast Density Category b: There are scattered areas of
fibroglandular density.
FINDINGS: Mammogram:

Right breast: Full field and spot compression tomosynthesis views
were performed of the right breast to evaluate the area of concern
in the inferior right breast. There is no abnormality at the
palpable site of concern. No suspicious mass, distortion, or
microcalcifications are identified to suggest presence of
malignancy.

Left breast: No suspicious mass, distortion, or microcalcifications
are identified to suggest presence of malignancy.

Mammographic images were processed with CAD.

On physical exam, I feel a prominent rib at the site of concern in
the inferior left breast at the inframammary fold. No fixed discrete
mass.

Ultrasound:

Targeted ultrasound is performed at the palpable site of concern in
the inferior right breast at 5:30 o'clock. There is no suspicious
cystic or solid mass. There is a prominent rib which likely
corresponds to the lump felt by the patient's physician.
IMPRESSION: No mammographic or sonographic evidence of malignancy at the site of
concern in the inferior right breast. No mammographic evidence of
malignancy in the left breast.

RECOMMENDATION:
1.  Begin routine annual screening mammography at age 38.

2.  Clinical follow-up as needed for the right breast.

I have discussed the findings and recommendations with the patient.
If applicable, a reminder letter will be sent to the patient
regarding the next appointment.

BI-RADS CATEGORY  1: Negative.

## 2022-02-21 IMAGING — US US BREAST*R* LIMITED INC AXILLA
1 series · 4 of 4 positions shown · non-contrast
Comparison: Previous exam(s).

CLINICAL DATA: 33-year-old female presenting with a palpable area
of concern felt by her doctor in the inferior aspect of the right
breast.

EXAM:
DIGITAL DIAGNOSTIC BILATERAL MAMMOGRAM WITH CAD AND TOMO
ULTRASOUND RIGHT BREAST

[Series 1: us breast*right* limited inc axilla · 0.06mm/px · 4 of 4 slices shown]
[im 1/4]
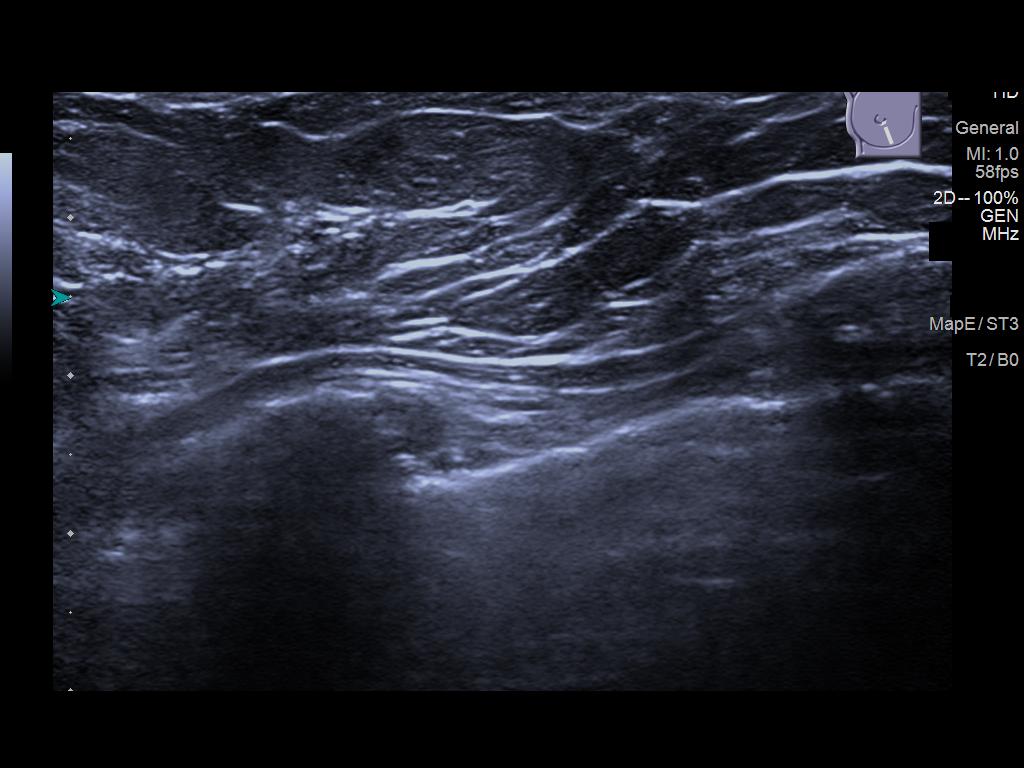
[im 2/4]
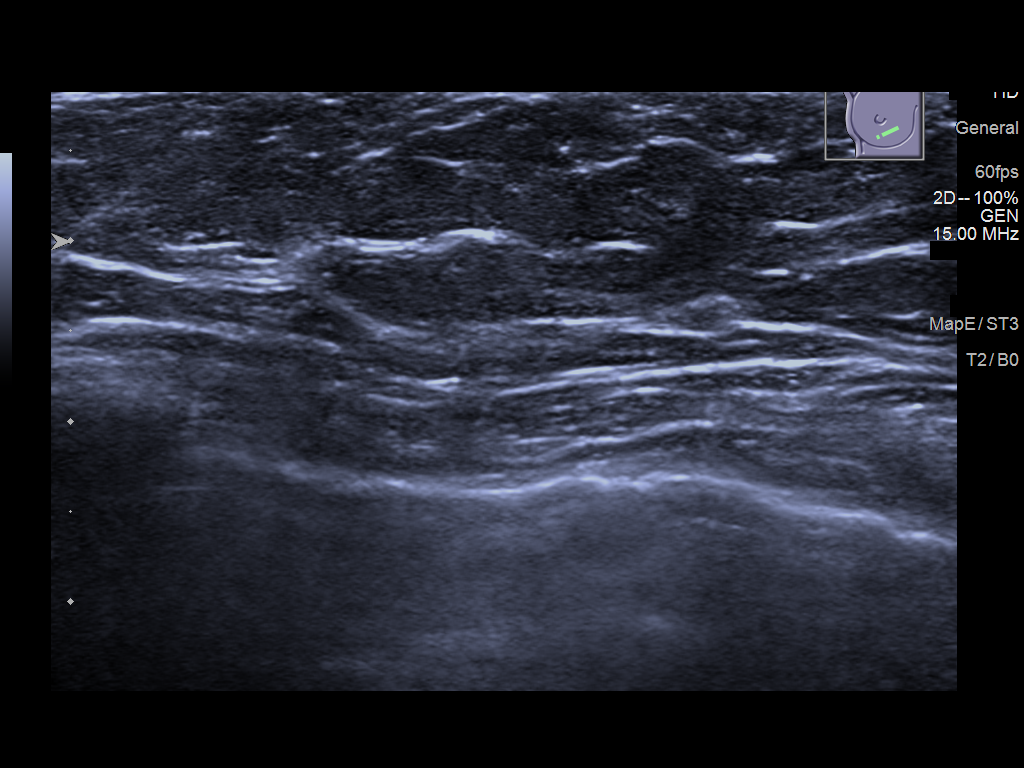
[im 3/4]
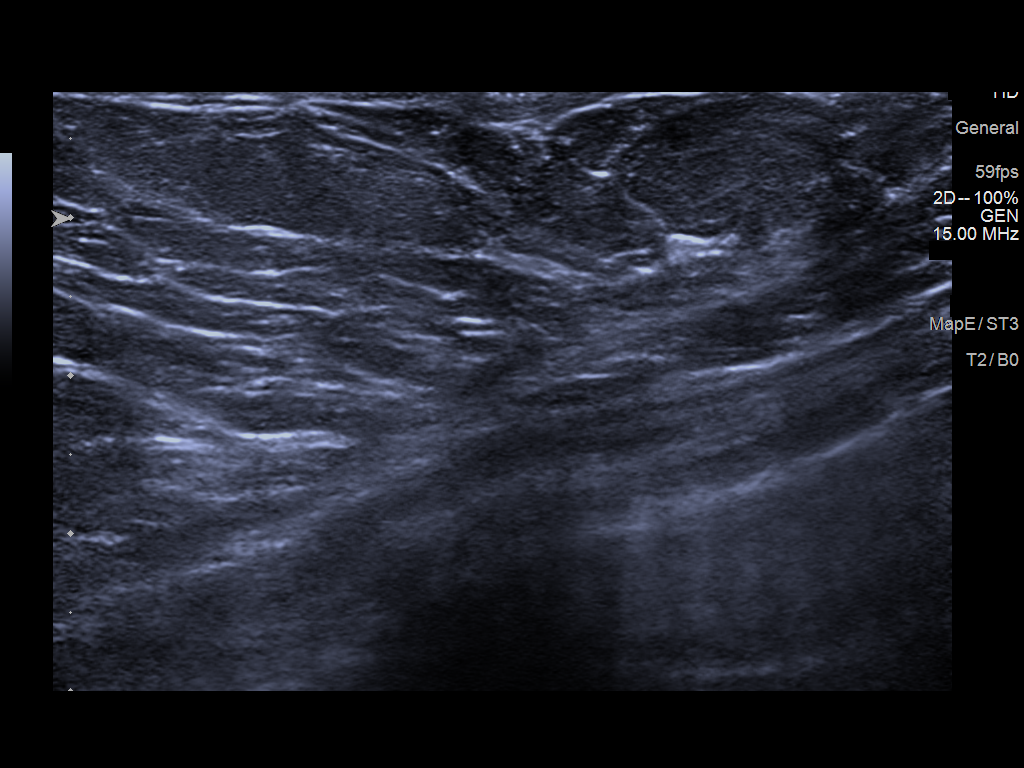
[im 4/4]
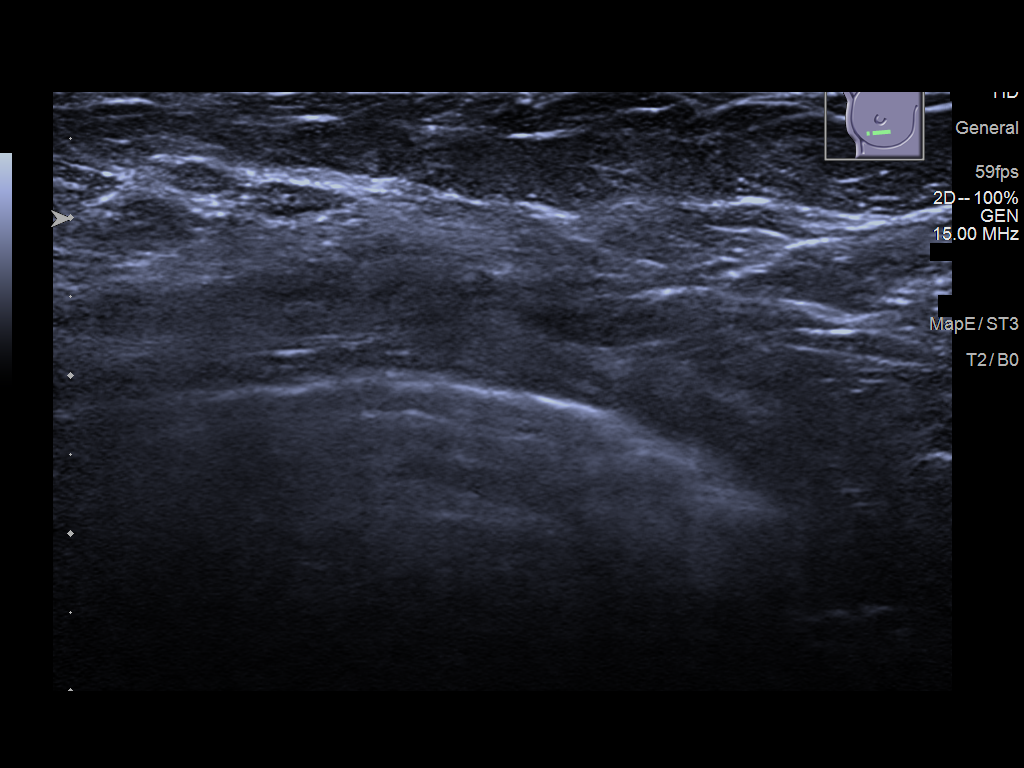

[4 of 4 positions shown; findings below may reference images not displayed]

ACR Breast Density Category b: There are scattered areas of
fibroglandular density.
FINDINGS: Mammogram:

Right breast: Full field and spot compression tomosynthesis views
were performed of the right breast to evaluate the area of concern
in the inferior right breast. There is no abnormality at the
palpable site of concern. No suspicious mass, distortion, or
microcalcifications are identified to suggest presence of
malignancy.

Left breast: No suspicious mass, distortion, or microcalcifications
are identified to suggest presence of malignancy.

Mammographic images were processed with CAD.

On physical exam, I feel a prominent rib at the site of concern in
the inferior left breast at the inframammary fold. No fixed discrete
mass.

Ultrasound:

Targeted ultrasound is performed at the palpable site of concern in
the inferior right breast at 5:30 o'clock. There is no suspicious
cystic or solid mass. There is a prominent rib which likely
corresponds to the lump felt by the patient's physician.
IMPRESSION: No mammographic or sonographic evidence of malignancy at the site of
concern in the inferior right breast. No mammographic evidence of
malignancy in the left breast.

RECOMMENDATION:
1.  Begin routine annual screening mammography at age 38.

2.  Clinical follow-up as needed for the right breast.

I have discussed the findings and recommendations with the patient.
If applicable, a reminder letter will be sent to the patient
regarding the next appointment.

BI-RADS CATEGORY  1: Negative.

## 2023-01-20 ENCOUNTER — Encounter: Payer: BLUE CROSS/BLUE SHIELD | Admitting: Nurse Practitioner

## 2023-02-14 ENCOUNTER — Ambulatory Visit
Admission: EM | Admit: 2023-02-14 | Discharge: 2023-02-14 | Discharge status: Against Medical Advice | Payer: BLUE CROSS/BLUE SHIELD

## 2023-08-07 ENCOUNTER — Encounter: Payer: Self-pay | Admitting: Nurse Practitioner

## 2023-08-07 ENCOUNTER — Ambulatory Visit: Admitting: Nurse Practitioner

## 2023-08-07 VITALS — BP 118/70 | HR 92 | Temp 97.7°F | Ht 62.75 in | Wt 194.7 lb

## 2023-08-07 DIAGNOSIS — J4520 Mild intermittent asthma, uncomplicated: Secondary | ICD-10-CM | POA: Diagnosis not present

## 2023-08-07 DIAGNOSIS — G43109 Migraine with aura, not intractable, without status migrainosus: Secondary | ICD-10-CM

## 2023-08-07 DIAGNOSIS — F419 Anxiety disorder, unspecified: Secondary | ICD-10-CM | POA: Diagnosis not present

## 2023-08-07 DIAGNOSIS — Z7689 Persons encountering health services in other specified circumstances: Secondary | ICD-10-CM

## 2023-08-07 DIAGNOSIS — G43809 Other migraine, not intractable, without status migrainosus: Secondary | ICD-10-CM

## 2023-08-07 DIAGNOSIS — E785 Hyperlipidemia, unspecified: Secondary | ICD-10-CM

## 2023-08-07 DIAGNOSIS — M79605 Pain in left leg: Secondary | ICD-10-CM

## 2023-08-07 DIAGNOSIS — Z130 Encounter for screening for diseases of the blood and blood-forming organs and certain disorders involving the immune mechanism: Secondary | ICD-10-CM

## 2023-08-07 DIAGNOSIS — I83813 Varicose veins of bilateral lower extremities with pain: Secondary | ICD-10-CM

## 2023-08-07 DIAGNOSIS — E6609 Other obesity due to excess calories: Secondary | ICD-10-CM

## 2023-08-07 DIAGNOSIS — Z131 Encounter for screening for diabetes mellitus: Secondary | ICD-10-CM

## 2023-08-07 DIAGNOSIS — E559 Vitamin D deficiency, unspecified: Secondary | ICD-10-CM

## 2023-08-07 DIAGNOSIS — Z6834 Body mass index (BMI) 34.0-34.9, adult: Secondary | ICD-10-CM

## 2023-08-07 DIAGNOSIS — Z13220 Encounter for screening for lipoid disorders: Secondary | ICD-10-CM

## 2023-08-07 DIAGNOSIS — D509 Iron deficiency anemia, unspecified: Secondary | ICD-10-CM

## 2023-08-07 DIAGNOSIS — Z8632 Personal history of gestational diabetes: Secondary | ICD-10-CM

## 2023-08-07 DIAGNOSIS — M79604 Pain in right leg: Secondary | ICD-10-CM

## 2023-08-07 MED ORDER — CYCLOBENZAPRINE HCL 10 MG PO TABS: 10.0000 mg | ORAL_TABLET | Freq: Three times a day (TID) | ORAL | 0 refills | Status: AC | PRN

## 2023-08-07 MED ORDER — PHENTERMINE HCL 37.5 MG PO TABS: 37.5000 mg | ORAL_TABLET | Freq: Every day | ORAL | 1 refills | Status: DC

## 2023-08-07 MED ORDER — KETOROLAC TROMETHAMINE 10 MG PO TABS: 10.0000 mg | ORAL_TABLET | Freq: Four times a day (QID) | ORAL | 2 refills | Status: AC | PRN

## 2023-08-07 NOTE — Progress Notes (Signed)
 BP 118/70   Pulse 92   Temp 97.7 F (36.5 C)   Ht 5' 2.75" (1.594 m)   Wt 194 lb 11.2 oz (88.3 kg)   LMP 08/06/2023   SpO2 93%   BMI 34.76 kg/m    Subjective:    Patient ID: Cindy Walker, female    DOB: [birth year], 38 y.o.   MRN: [id number]  HPI: Cindy Walker is a 38 y.o. female  Chief Complaint  Patient presents with   Establish Care   Obesity    Discuss weight loss meds phentermine    Leg Pain    Bilateral achy feeling, does have varicose veins and getting worse    Discussed the use of AI scribe software for clinical note transcription with the patient, who gave verbal consent to proceed.  History of Present Illness Cindy Walker is a 38 year old female who presents to establish care with concerns about obesity and leg pain.  She is concerned about obesity, currently weighing 194 pounds with a BMI of 34.76. She previously used phentermine  in 2020 with good success for about a year before it became less effective. She also tried Zepbound through [employer] without noticing any benefit. She tolerated phentermine  well, experiencing only mild lightheadedness initially.  She reports worsening varicose veins and leg pain since her last pregnancy three years ago. The leg pain occurs three to four days a week, especially after prolonged standing, and she uses ibuprofen  for relief. She also experiences knee pain in both knees, which 'make awful noises.' Previous treatment for varicose veins with injections in 2017 was ineffective. She occasionally experiences tingling and a sensation of crawling on her legs. No numbness or recent knee injury.  Her past medical history includes anxiety, asthma, migraines, gestational diabetes, hyperlipidemia, iron  deficiency anemia, and vitamin D  deficiency. Her asthma is well controlled, and she does not take medication for anxiety, which is 'fairly well' controlled. She uses Toradol  and Flexeril  10 mg as needed for migraines  but has run out of Flexeril . She had gestational diabetes during her last pregnancy and a history of high cholesterol, with an LDL of 113 two years ago. She had anemia during pregnancy but does not recall a formal diagnosis outside of that period.  She has not had labs done in two years.         08/07/2023   10:22 AM 08/17/2021    8:56 AM 07/20/2021   10:35 AM  Depression screen PHQ 2/9  Decreased Interest 0 0 0  Down, Depressed, Hopeless 0 0 0  PHQ - 2 Score 0 0 0  Altered sleeping 1 1 1   Tired, decreased energy 0 1 1  Change in appetite 0 0 1  Feeling bad or failure about yourself  0 0 0  Trouble concentrating 0 0 0  Moving slowly or fidgety/restless 0 0 0  Suicidal thoughts 0 0 0  PHQ-9 Score 1 2 3   Difficult doing work/chores Not difficult at all  Not difficult at all       08/07/2023   10:23 AM 08/17/2021    8:56 AM 07/20/2021   10:36 AM 02/16/2021    9:50 AM  GAD 7 : Generalized Anxiety Score  Nervous, Anxious, on Edge 0 1 1 1   Control/stop worrying 0 0 0 0  Worry too much - different things 0 0 0 0  Trouble relaxing 0 1 0 0  Restless 0 0 0 0  Easily annoyed or irritable 0 0  0 1  Afraid - awful might happen 0 0 0 0  Total GAD 7 Score 0 2 1 2   Anxiety Difficulty Not difficult at all  Not difficult at all Not difficult at all     Relevant past medical, surgical, family and social history reviewed and updated as indicated. Interim medical history since our last visit reviewed. Allergies and medications reviewed and updated.  Review of Systems  Ten systems reviewed and is negative except as mentioned in HPI      Objective:      BP 118/70   Pulse 92   Temp 97.7 F (36.5 C)   Ht 5' 2.75" (1.594 m)   Wt 194 lb 11.2 oz (88.3 kg)   LMP 08/06/2023   SpO2 93%   BMI 34.76 kg/m    Wt Readings from Last 3 Encounters:  08/07/23 194 lb 11.2 oz (88.3 kg)  08/17/21 182 lb (82.6 kg)  07/20/21 188 lb (85.3 kg)    Physical Exam Vitals reviewed.  Constitutional:       Appearance: Normal appearance.  HENT:     Head: Normocephalic.  Cardiovascular:     Rate and Rhythm: Normal rate and regular rhythm.  Pulmonary:     Effort: Pulmonary effort is normal.     Breath sounds: Normal breath sounds.  Musculoskeletal:        General: Normal range of motion.  Skin:    General: Skin is warm and dry.  Neurological:     General: No focal deficit present.     Mental Status: She is alert and oriented to person, place, and time. Mental status is at baseline.  Psychiatric:        Mood and Affect: Mood normal.        Behavior: Behavior normal.        Thought Content: Thought content normal.        Judgment: Judgment normal.      Results for orders placed or performed in visit on 05/24/21  Cytology - PAP( [facility])   Collection Time: 05/24/21 10:48 AM  Result Value Ref Range   High risk HPV Negative    Adequacy      Satisfactory for evaluation; transformation zone component PRESENT.   Diagnosis      - Negative for intraepithelial lesion or malignancy (NILM)   Comment Normal Reference Range HPV - Negative   Comp Met (CMET)   Collection Time: 05/24/21 11:09 AM  Result Value Ref Range   Glucose 91 70 - 99 mg/dL   BUN 14 6 - 20 mg/dL   Creatinine, Ser 0.98 0.57 - 1.00 mg/dL   eGFR 119 >14 NW/GNF/6.21   BUN/Creatinine Ratio 19 9 - 23   Sodium 138 134 - 144 mmol/L   Potassium 4.1 3.5 - 5.2 mmol/L   Chloride 101 96 - 106 mmol/L   CO2 24 20 - 29 mmol/L   Calcium 9.5 8.7 - 10.2 mg/dL   Total Protein 7.1 6.0 - 8.5 g/dL   Albumin 4.7 3.8 - 4.8 g/dL   Globulin, Total 2.4 1.5 - 4.5 g/dL   Albumin/Globulin Ratio 2.0 1.2 - 2.2   Bilirubin Total <0.2 0.0 - 1.2 mg/dL   Alkaline Phosphatase 63 44 - 121 IU/L   AST 14 0 - 40 IU/L   ALT 20 0 - 32 IU/L  Hemoglobin A1c   Collection Time: 05/24/21 11:09 AM  Result Value Ref Range   Hgb A1c MFr Bld 5.4 4.8 - 5.6 %  Est. average glucose Bld gHb Est-mCnc 108 mg/dL  Lipid panel   Collection Time: 05/24/21 11:09 AM   Result Value Ref Range   Cholesterol, Total 186 100 - 199 mg/dL   Triglycerides 119 (H) 0 - 149 mg/dL   HDL 38 (L) >14 mg/dL   VLDL Cholesterol Cal 35 5 - 40 mg/dL   LDL Chol Calc (NIH) 782 (H) 0 - 99 mg/dL   Chol/HDL Ratio 4.9 (H) 0.0 - 4.4 ratio  TSH   Collection Time: 05/24/21 11:09 AM  Result Value Ref Range   TSH 1.680 0.450 - 4.500 uIU/mL  CBC   Collection Time: 05/24/21 11:09 AM  Result Value Ref Range   WBC 7.4 3.4 - 10.8 x10E3/uL   RBC 4.35 3.77 - 5.28 x10E6/uL   Hemoglobin 12.4 11.1 - 15.9 g/dL   Hematocrit 95.6 21.3 - 46.6 %   MCV 85 79 - 97 fL   MCH 28.5 26.6 - 33.0 pg   MCHC 33.4 31.5 - 35.7 g/dL   RDW 08.6 57.8 - 46.9 %   Platelets 389 150 - 450 x10E3/uL          Assessment & Plan:   Problem List Items Addressed This Visit       Cardiovascular and Mediastinum   Migraine   Relevant Medications   cyclobenzaprine  (FLEXERIL ) 10 MG tablet   ketorolac  (TORADOL ) 10 MG tablet     Respiratory   Asthma     Other   Vitamin D  deficiency   Relevant Orders   VITAMIN D  25 Hydroxy (Vit-D Deficiency, Fractures)   Iron  deficiency anemia   Relevant Orders   CBC with Differential/Platelet   Iron , TIBC and Ferritin Panel   History of gestational diabetes   Relevant Orders   Comprehensive metabolic panel with GFR   Hemoglobin A1c   Hyperlipidemia LDL goal <100   Relevant Orders   Lipid panel   Anxiety   Class 1 obesity due to excess calories with serious comorbidity and body mass index (BMI) of 34.0 to 34.9 in adult - Primary   Relevant Medications   phentermine  (ADIPEX-P ) 37.5 MG tablet   Other Relevant Orders   TSH   Other Visit Diagnoses       Screening for cholesterol level       Relevant Orders   Lipid panel     Screening for diabetes mellitus       Relevant Orders   Comprehensive metabolic panel with GFR   Hemoglobin A1c     Screening for deficiency anemia       Relevant Orders   CBC with Differential/Platelet     Encounter to establish  care       Relevant Medications   cyclobenzaprine  (FLEXERIL ) 10 MG tablet     Bilateral leg pain       Relevant Orders   CBC with Differential/Platelet   Comprehensive metabolic panel with GFR   Vitamin B12   Iron , TIBC and Ferritin Panel   TSH   Magnesium     Varicose veins of both lower extremities with pain            Assessment and Plan Assessment & Plan Varicose veins with pain Worsening varicose veins post-pregnancy with associated leg pain, occurring 3-4 days a week. Previous sclerotherapy in 2017 was ineffective. Symptoms include tingling and crawling sensations. Differential diagnosis includes other potential causes of leg pain, to be ruled out with blood work. Pain management strategies discussed. - Order CBC, CMP, lipid panel,  hemoglobin A1c, vitamin D , B12, iron  panel, TSH to rule out other causes of leg pain  Knee pain/leg pain Bilateral knee pain with crepitus, worsened post-pregnancy. Pain is bothersome but not severe enough to warrant surgical intervention. Discussed conservative management options, including topical NSAIDs. - Recommend Voltaren gel for arthritis pain relief - Advise against knee replacement surgery due to absence of severe pain  Asthma - stable  Migraines -stable -takes flexeril  and toradol   Hld -getting labs  Vitamin d  deficiency -checking labs  Iron  deficiency anemia -getting cbc  Obesity Obesity with BMI of 34.76. Challenges in weight management, previously on phentermine  with success. Recent neglect of self-care post-pregnancy. Discussed potential side effects of phentermine , including dry mouth, elevated blood pressure, and palpitations, especially with age. Recommended starting with a half dose to mitigate initial side effects. Emphasized monitoring blood pressure due to potential cardiovascular effects. - Prescribe phentermine  37.5 mg, advise starting with half a dose - Advise increasing water intake while on phentermine  -  Monitor blood pressure regularly - Educate on potential side effects of phentermine , including dry mouth, elevated blood pressure, and palpitations  Follow-up Plan for further evaluation and management. - Schedule follow-up appointment for physical examination - Send prescriptions to [facility] - Ensure MyChart is set up for viewing lab results        Follow up plan: Return in about 6 months (around 02/07/2024) for cpe.

## 2023-08-08 ENCOUNTER — Ambulatory Visit: Payer: Self-pay | Admitting: Nurse Practitioner

## 2023-08-08 ENCOUNTER — Other Ambulatory Visit: Payer: Self-pay | Admitting: Nurse Practitioner

## 2023-08-08 DIAGNOSIS — E559 Vitamin D deficiency, unspecified: Secondary | ICD-10-CM

## 2023-08-08 DIAGNOSIS — D509 Iron deficiency anemia, unspecified: Secondary | ICD-10-CM

## 2023-08-08 LAB — COMPREHENSIVE METABOLIC PANEL WITH GFR
AG Ratio: 1.5 (calc) (ref 1.0–2.5)
ALT: 11 U/L (ref 6–29)
AST: 14 U/L (ref 10–30)
Albumin: 4.6 g/dL (ref 3.6–5.1)
Alkaline phosphatase (APISO): 48 U/L (ref 31–125)
BUN: 17 mg/dL (ref 7–25)
CO2: 29 mmol/L (ref 20–32)
Calcium: 9.7 mg/dL (ref 8.6–10.2)
Chloride: 103 mmol/L (ref 98–110)
Creat: 0.74 mg/dL (ref 0.50–0.97)
Globulin: 3 g/dL (ref 1.9–3.7)
Glucose, Bld: 88 mg/dL (ref 65–99)
Potassium: 4.7 mmol/L (ref 3.5–5.3)
Sodium: 141 mmol/L (ref 135–146)
Total Bilirubin: 0.4 mg/dL (ref 0.2–1.2)
Total Protein: 7.6 g/dL (ref 6.1–8.1)
eGFR: 105 mL/min/{1.73_m2} (ref >=60)

## 2023-08-08 LAB — CBC WITH DIFFERENTIAL/PLATELET
Absolute Lymphocytes: 2407 {cells}/uL (ref 850–3900)
Absolute Monocytes: 504 {cells}/uL (ref 200–950)
Basophils Absolute: 92 {cells}/uL (ref 0–200)
Basophils Relative: 1.3 %
Eosinophils Absolute: 249 {cells}/uL (ref 15–500)
Eosinophils Relative: 3.5 %
HCT: 37.2 % (ref 35.0–45.0)
Hemoglobin: 11.5 g/dL — ABNORMAL LOW (ref 11.7–15.5)
MCH: 25.4 pg — ABNORMAL LOW (ref 27.0–33.0)
MCHC: 30.9 g/dL — ABNORMAL LOW (ref 32.0–36.0)
MCV: 82.1 fL (ref 80.0–100.0)
MPV: 9.9 fL (ref 7.5–12.5)
Monocytes Relative: 7.1 %
Neutro Abs: 3848 {cells}/uL (ref 1500–7800)
Neutrophils Relative %: 54.2 %
Platelets: 429 10*3/uL — ABNORMAL HIGH (ref 140–400)
RBC: 4.53 10*6/uL (ref 3.80–5.10)
RDW: 14.5 % (ref 11.0–15.0)
Total Lymphocyte: 33.9 %
WBC: 7.1 10*3/uL (ref 3.8–10.8)

## 2023-08-08 LAB — IRON,TIBC AND FERRITIN PANEL
%SAT: 15 % — ABNORMAL LOW (ref 16–45)
Ferritin: 8 ng/mL — ABNORMAL LOW (ref 16–154)
Iron: 70 ug/dL (ref 40–190)
TIBC: 452 ug/dL — ABNORMAL HIGH (ref 250–450)

## 2023-08-08 LAB — MAGNESIUM: Magnesium: 2 mg/dL (ref 1.5–2.5)

## 2023-08-08 LAB — VITAMIN B12: Vitamin B-12: 490 pg/mL (ref 200–1100)

## 2023-08-08 LAB — VITAMIN D 25 HYDROXY (VIT D DEFICIENCY, FRACTURES): Vit D, 25-Hydroxy: 18 ng/mL — ABNORMAL LOW (ref 30–100)

## 2023-08-08 LAB — LIPID PANEL
Cholesterol: 225 mg/dL — ABNORMAL HIGH (ref <200)
HDL: 56 mg/dL (ref >=50)
LDL Cholesterol (Calc): 143 mg/dL — ABNORMAL HIGH
Non-HDL Cholesterol (Calc): 169 mg/dL — ABNORMAL HIGH (ref <130)
Total CHOL/HDL Ratio: 4 (calc) (ref <5.0)
Triglycerides: 134 mg/dL (ref <150)

## 2023-08-08 LAB — TSH: TSH: 1.17 m[IU]/L

## 2023-08-08 LAB — HEMOGLOBIN A1C
Hgb A1c MFr Bld: 5.7 % — ABNORMAL HIGH (ref <5.7)
Mean Plasma Glucose: 117 mg/dL
eAG (mmol/L): 6.5 mmol/L

## 2023-08-08 MED ORDER — VITAMIN D (ERGOCALCIFEROL) 1.25 MG (50000 UNIT) PO CAPS: 50000.0000 [IU] | ORAL_CAPSULE | ORAL | 0 refills | Status: AC

## 2023-08-08 MED ORDER — IRON (FERROUS SULFATE) 325 (65 FE) MG PO TABS: 325.0000 mg | ORAL_TABLET | Freq: Every day | ORAL | 1 refills | Status: AC

## 2023-10-08 ENCOUNTER — Other Ambulatory Visit: Payer: Self-pay | Admitting: Nurse Practitioner

## 2023-10-08 DIAGNOSIS — E559 Vitamin D deficiency, unspecified: Secondary | ICD-10-CM

## 2023-10-10 NOTE — Telephone Encounter (Signed)
 Requested medications are due for refill today.  yes  Requested medications are on the active medications list.  yes  Last refill. 5/202/025 #8 0 rf  Future visit scheduled.   yes  Notes to clinic.  Provider to review.    Requested Prescriptions  Pending Prescriptions Disp Refills   Vitamin D , Ergocalciferol , (DRISDOL ) 1.25 MG (50000 UNIT) CAPS capsule [Pharmacy Med Name: VITAMIN D2 1.25MG (50,000 UNIT)] 8 capsule 0    Sig: Take 1 capsule (50,000 Units total) by mouth every 7 (seven) days.     Endocrinology:  Vitamins - Vitamin D  Supplementation 2 Failed - 10/10/2023 11:30 AM      Failed - Manual Review: Route requests for 50,000 IU strength to the provider      Failed - Vitamin D  in normal range and within 360 days    Vitamin D2 1, 25 (OH)2  Date Value Ref Range Status  05/21/2019 16 pg/mL Final    Comment:    This test was developed and its performance characteristics determined by [facility]. It has not been cleared or approved by the Food and Drug Administration.    Vitamin D3 1, 25 (OH)2  Date Value Ref Range Status  05/21/2019 24 pg/mL Final    Comment:    This test was developed and its performance characteristics determined by [facility]. It has not been cleared or approved by the Food and Drug Administration.    Vitamin D  1, 25 (OH)2 Total  Date Value Ref Range Status  05/21/2019 40 pg/mL Final    Comment:    Reference Range: Adults: 21 - 65    Vit D, 25-Hydroxy  Date Value Ref Range Status  08/07/2023 18 (L) 30 - 100 ng/mL Final    Comment:    Vitamin D  Status         25-OH Vitamin D : . Deficiency:                    <20 ng/mL Insufficiency:             20 - 29 ng/mL Optimal:                 > or = 30 ng/mL . For 25-OH Vitamin D  testing on patients on  D2-supplementation and patients for whom quantitation  of D2 and D3 fractions is required, the [facility](TM) 25-OH VIT D, (D2,D3), LC/MS/MS is recommended: order  code 07111 (patients >61yrs). . See  Note 1 . Note 1 . For additional information, please refer to  http://education.[facility].com/faq/FAQ199  (This link is being provided for informational/ educational purposes only.)          Passed - Ca in normal range and within 360 days    Calcium  Date Value Ref Range Status  08/07/2023 9.7 8.6 - 10.2 mg/dL Final         Passed - Valid encounter within last 12 months    Recent Outpatient Visits           2 months ago Class 1 obesity due to excess calories with serious comorbidity and body mass index (BMI) of 34.0 to 34.9 in adult   [facility] Gareth Mliss FALCON, FNP       Future Appointments             In 4 months Gareth, Mliss FALCON, FNP [facility], [facility]

## 2024-02-07 ENCOUNTER — Other Ambulatory Visit: Payer: Self-pay | Admitting: Nurse Practitioner

## 2024-02-07 ENCOUNTER — Encounter: Payer: Self-pay | Admitting: Nurse Practitioner

## 2024-02-07 ENCOUNTER — Ambulatory Visit (INDEPENDENT_AMBULATORY_CARE_PROVIDER_SITE_OTHER): Admitting: Nurse Practitioner

## 2024-02-07 VITALS — BP 136/86 | HR 61 | Temp 98.0°F | Ht 62.0 in | Wt 203.0 lb

## 2024-02-07 DIAGNOSIS — Z6834 Body mass index (BMI) 34.0-34.9, adult: Secondary | ICD-10-CM

## 2024-02-07 DIAGNOSIS — E6609 Other obesity due to excess calories: Secondary | ICD-10-CM

## 2024-02-07 DIAGNOSIS — D509 Iron deficiency anemia, unspecified: Secondary | ICD-10-CM | POA: Diagnosis not present

## 2024-02-07 DIAGNOSIS — E66811 Obesity, class 1: Secondary | ICD-10-CM | POA: Diagnosis not present

## 2024-02-07 DIAGNOSIS — Z0000 Encounter for general adult medical examination without abnormal findings: Secondary | ICD-10-CM | POA: Diagnosis not present

## 2024-02-07 DIAGNOSIS — I83813 Varicose veins of bilateral lower extremities with pain: Secondary | ICD-10-CM | POA: Diagnosis not present

## 2024-02-07 DIAGNOSIS — N920 Excessive and frequent menstruation with regular cycle: Secondary | ICD-10-CM

## 2024-02-07 DIAGNOSIS — R32 Unspecified urinary incontinence: Secondary | ICD-10-CM | POA: Diagnosis not present

## 2024-02-07 DIAGNOSIS — Z1231 Encounter for screening mammogram for malignant neoplasm of breast: Secondary | ICD-10-CM | POA: Diagnosis not present

## 2024-02-07 MED ORDER — WEGOVY 0.25 MG/0.5ML ~~LOC~~ SOAJ: 0.2500 mg | SUBCUTANEOUS | 0 refills | Status: AC

## 2024-02-07 NOTE — Patient Instructions (Addendum)
 Healthy Weight Loss Guide ?? Weight Loss Goal - Aim for 1-2 pounds per week - Target: 5-10% of your starting body weight over 3-6 months ??? Nutrition Tips - Eat 3 meals per day and avoid skipping meals - Fill half your plate with vegetables, a quarter with protein, a quarter with whole grains - Choose lean proteins: chicken, fish, eggs, tofu, beans - Limit: - Sugary drinks (soda, sweet tea, juice) - Fried foods and fast food - Processed snacks (chips, candy, cookies) - Drink at least 64 oz of water per day - Practice portion control and mindful eating ???? Lifestyle Habits - Track what you eat (apps like MyFitnessPal, Lose It!, or a paper log) - Get 7-9 hours of sleep per night - Manage stress (meditation, breathing exercises, counseling if needed) - Limit alcohol (empty calories and may increase hunger) ???? Exercise Recommendations - Goal: 150 minutes per week of moderate activity (e.g., brisk walking, cycling) - Start with 10-15 minutes/day and build up gradually - Add 2 days per week of strength training (light weights, resistance bands, or bodyweight) ?? Remember: Progress > Perfection Small changes every day add up. Don't give up! - Avoid high-fat or greasy foods to reduce nausea - Focus on protein at each meal to preserve muscle mass - Stay well hydrated (at least 64 oz water per day) - Limit sugar and processed carbohydrates ?? Managing Side Effects if on weight loss medication - Eat slowly and stop eating when you feel full - Use anti-nausea strategies: ginger tea, peppermint, crackers - Talk to your provider about adjusting the dose if needed - Stool softeners or fiber supplements can help with constipation ?? Staying on Track - Track weight and non-scale victories (energy, clothing fit, labs) - Follow up with your provider regularly - Don't stop medication without medical guidance - Combine medication with healthy habits for best results ?? Remember Weight loss  medications are a tool, not a shortcut. Healthy habits matter. Be patient and consistent--small changes lead to big results. Schedule mammogram [phone]

## 2024-02-07 NOTE — Progress Notes (Signed)
 Name: Cindy Walker   MRN: [id number]    DOB: 12-18-1985   Date:02/07/2024       Progress Note  Subjective  Chief Complaint  Chief Complaint  Patient presents with   Annual Exam    Pt would like to discuss wanting to lose weight and bilat LE pain.     HPI  Patient presents for annual CPE. Discussed the use of AI scribe software for clinical note transcription with the patient, who gave verbal consent to proceed.  History of Present Illness Cindy Walker is a 38 year old female who presents for an annual physical exam and evaluation of leg pain.  Lower extremity pain and varicosities - Worsening varicose veins with associated leg pain occurring three to four days per week, especially since pregnancy - Pain varies in quality: muscle pain, tingling, nerve pain, and crawling sensations - Pain affects one or both legs - History of sclerotherapy in 2017 without improvement - Does not routinely use ibuprofen  for leg pain  Menstrual irregularities and heavy bleeding - Heavy menstrual periods, more pronounced since birth of fourth child three years ago - Mid-cycle spotting - Occasional pain during menstrual periods - Currently menstruating, suspects recent menses affected iron  levels - No gynecologic evaluation in several years  Obesity and weight management - Current weight 203 pounds, BMI 37.13 - History of obesity - Previous use of phentermine , discontinued due to side effects - Brief trial of Zepbound - Attempts at weight loss with Weight Watchers and other methods - Difficulty maintaining weight loss and with willpower  Anemia and iron  deficiency - History of anemia - Recent laboratory findings: mild anemia and low iron  levels - Currently taking iron  supplements - Vitamin B12 levels at low end of normal  Urinary incontinence - Urinary leakage - Mother suggested pelvic floor therapy  Sexual activity and reproductive history - Currently sexually active -  Mother of four children - Menstrual cycle and body changes noted since birth of fourth child  Vision - No recent eye examination - Needs eye exam    Diet: well balanced diet Exercise: not currently consistent, trying to get a walk a week.  recommend 150 min of physical activity weekly   Sleep: 6-7 hours Last dental exam: recently Last eye exam: will schedule  Flowsheet Row Office Visit from 02/07/2024 in [facility]  AUDIT-C Score 5    Depression: Phq 9 is  positive    02/07/2024    9:05 AM 08/07/2023   10:22 AM 08/17/2021    8:56 AM 07/20/2021   10:35 AM 02/16/2021    9:49 AM  Depression screen PHQ 2/9  Decreased Interest 0 0 0 0 0  Down, Depressed, Hopeless 1 0 0 0 0  PHQ - 2 Score 1 0 0 0 0  Altered sleeping 0 1 1 1 1   Tired, decreased energy 1 0 1 1 1   Change in appetite 1 0 0 1 1  Feeling bad or failure about yourself  1 0 0 0 1  Trouble concentrating 0 0 0 0 0  Moving slowly or fidgety/restless 0 0 0 0 0  Suicidal thoughts 0 0 0 0 0  PHQ-9 Score 4 1  2  3  4    Difficult doing work/chores Not difficult at all Not difficult at all  Not difficult at all Not difficult at all     Data saved with a previous flowsheet row definition   Hypertension: BP Readings from Last 3  Encounters:  02/07/24 136/86  08/07/23 118/70  08/17/21 127/84   Obesity: Wt Readings from Last 3 Encounters:  02/07/24 203 lb (92.1 kg)  08/07/23 194 lb 11.2 oz (88.3 kg)  08/17/21 182 lb (82.6 kg)   BMI Readings from Last 3 Encounters:  02/07/24 37.13 kg/m  08/07/23 34.76 kg/m  08/17/21 32.25 kg/m    Flowsheet Row Office Visit from 02/07/2024 in [facility]  1 45 inches     Vaccines:  HPV: up to at age 40 , ask insurance if age between 46-45  Shingrix: 35-64 yo and ask insurance if covered when patient above 68 yo Pneumonia:  educated and discussed with patient. Flu:  educated and discussed with patient.  Hep C Screening:  completed STD testing and prevention (HIV/chl/gon/syphilis): completed Intimate partner violence:none Sexual History :sexually active Menstrual History/LMP/Abnormal Bleeding: LMP: yesterday Incontinence Symptoms: leaking, will place referral to urology  Breast cancer:  - Last Mammogram: 06/05/2019 - BRCA gene screening: none  Osteoporosis: Discussed high calcium and vitamin D  supplementation, weight bearing exercises  Cervical cancer screening: 05/24/2021  Skin cancer: Discussed monitoring for atypical lesions  Colorectal cancer: does not qualify   Lung cancer:   Low Dose CT Chest recommended if Age 85-80 years, 20 pack-year currently smoking OR have quit w/in 15years. Patient does not qualify.   ECG: none  Advanced Care Planning: A voluntary discussion about advance care planning including the explanation and discussion of advance directives.  Discussed health care proxy and Living will, and the patient was able to identify a health care proxy as husband.  Patient does have a living will at present time. If patient does have living will, I have requested they bring this to the clinic to be scanned in to their chart.  Lipids: Lab Results  Component Value Date   CHOL 225 (H) 08/07/2023   CHOL 186 05/24/2021   CHOL 220 (H) 02/16/2021   Lab Results  Component Value Date   HDL 56 08/07/2023   HDL 38 (L) 05/24/2021   HDL 58 02/16/2021   Lab Results  Component Value Date   LDLCALC 143 (H) 08/07/2023   LDLCALC 113 (H) 05/24/2021   LDLCALC 144 (H) 02/16/2021   Lab Results  Component Value Date   TRIG 134 08/07/2023   TRIG 198 (H) 05/24/2021   TRIG 101 02/16/2021   Lab Results  Component Value Date   CHOLHDL 4.0 08/07/2023   CHOLHDL 4.9 (H) 05/24/2021   CHOLHDL 3.8 02/16/2021   No results found for: LDLDIRECT  Glucose: Glucose  Date Value Ref Range Status  05/24/2021 91 70 - 99 mg/dL Final  88/70/7977 86 70 - 99 mg/dL Final  87/78/7978 822 (H) 65 - 99 mg/dL Final    Glucose Tolerance, Fasting  Date Value Ref Range Status  08/08/2014 73 70 - 104 mg/dL Final    Comment:    Patient identifiers, specimen collection times and test results have been confirmed.    Glucose, Bld  Date Value Ref Range Status  08/07/2023 88 65 - 99 mg/dL Final    Comment:    .            Fasting reference interval .   01/22/2020 110 (H) 70 - 99 mg/dL Final    Comment:    Glucose reference range applies only to samples taken after fasting for at least 8 hours.  07/25/2014 92 70 - 99 mg/dL Final   Glucose-Capillary  Date Value Ref Range Status  05/31/2020  73 70 - 99 mg/dL Final    Comment:    Glucose reference range applies only to samples taken after fasting for at least 8 hours.  05/29/2020 79 70 - 99 mg/dL Final    Comment:    Glucose reference range applies only to samples taken after fasting for at least 8 hours.  05/29/2020 123 (H) 70 - 99 mg/dL Final    Comment:    Glucose reference range applies only to samples taken after fasting for at least 8 hours.    Patient Active Problem List   Diagnosis Date Noted   Varicose veins of both lower extremities with pain 02/07/2024   Class 1 obesity due to excess calories with serious comorbidity and body mass index (BMI) of 34.0 to 34.9 in adult 08/07/2023   Anxiety    Hyperlipidemia LDL goal <100 07/20/2021   Iron  deficiency anemia 02/21/2021   History of gestational diabetes 02/21/2021   Vitamin D  deficiency 06/21/2018   Asthma 02/24/2012   Migraine 02/24/2012    Past Surgical History:  Procedure Laterality Date   BREAST CYST EXCISION Left 2014   CESAREAN SECTION  01/11/11; 09/27/2014   CESAREAN SECTION N/A 04/16/2012   Procedure: Cesarean Section;  Surgeon: Burnard VEAR Pate, MD;  Location: [facility];  Service: Obstetrics;  Laterality: N/A;  Wound Class (Clean contaminated)   CESAREAN SECTION WITH BILATERAL TUBAL LIGATION N/A 05/29/2020   Procedure: CESAREAN SECTION WITH BILATERAL TUBAL LIGATION;  Surgeon:  Fredirick Glenys RAMAN, MD;  Location: [facility];  Service: Obstetrics;  Laterality: N/A;   HERNIA REPAIR     INSERTION OF MESH N/A 12/01/2014   Procedure: INSERTION OF MESH;  Surgeon: Vicenta Poli, MD;  Location: [facility];  Service: General;  Laterality: N/A;   TUBAL LIGATION     UMBILICAL HERNIA REPAIR N/A 12/01/2014   Procedure: UMBILICAL HERNIA REPAIR ;  Surgeon: Vicenta Poli, MD;  Location: [facility];  Service: General;  Laterality: N/A;   WISDOM TOOTH EXTRACTION      Family History  Problem Relation Age of Onset   Alcohol abuse Brother    Diabetes Maternal Aunt        Type 2   Heart disease Maternal Grandmother    Arthritis Maternal Grandmother    COPD Maternal Grandmother    Stroke Maternal Grandmother    Cancer Maternal Grandfather        liver cancer   Heart disease Maternal Grandfather    Alcohol abuse Maternal Grandfather    Heart disease Paternal Grandmother    Heart disease Paternal Grandfather    Cleft palate Son        deceased   Birth defects Son    Asthma Daughter    Breast cancer Neg Hx     Social History   Socioeconomic History   Marital status: Married    Spouse name: Lamar   Number of children: Not on file   Years of education: Not on file   Highest education level: Master's degree (e.g., MA, MS, MEng, MEd, MSW, MBA)  Occupational History   Not on file  Tobacco Use   Smoking status: Former    Current packs/day: 0.00    Types: Cigarettes    Quit date: 12/19/2008    Years since quitting: 15.1   Smokeless tobacco: Never  Vaping Use   Vaping status: Never Used  Substance and Sexual Activity   Alcohol use: Yes    Alcohol/week: 12.0 standard drinks of alcohol  Types: 12 Shots of liquor per week   Drug use: No   Sexual activity: Yes    Partners: Male    Birth control/protection: Surgical    Comment: Tubes removed following last c-section  Other Topics Concern   Not on file  Social History Narrative   Not  on file   Social Drivers of Health   Financial Resource Strain: Low Risk  (02/06/2024)   Overall Financial Resource Strain (CARDIA)    Difficulty of Paying Living Expenses: Not hard at all  Food Insecurity: No Food Insecurity (02/06/2024)   Hunger Vital Sign    Worried About Running Out of Food in the Last Year: Never true    Ran Out of Food in the Last Year: Never true  Transportation Needs: No Transportation Needs (02/06/2024)   PRAPARE - Administrator, Civil Service (Medical): No    Lack of Transportation (Non-Medical): No  Physical Activity: Insufficiently Active (02/06/2024)   Exercise Vital Sign    Days of Exercise per Week: 1 day    Minutes of Exercise per Session: 30 min  Stress: Stress Concern Present (02/06/2024)   [employer] of Occupational Health - Occupational Stress Questionnaire    Feeling of Stress: To some extent  Social Connections: Moderately Integrated (02/06/2024)   Social Connection and Isolation Panel    Frequency of Communication with Friends and Family: More than three times a week    Frequency of Social Gatherings with Friends and Family: Not on file    Attends Religious Services: Never    Database Administrator or Organizations: Yes    Attends Engineer, Structural: More than 4 times per year    Marital Status: Married  Catering Manager Violence: Not At Risk (02/07/2024)   Humiliation, Afraid, Rape, and Kick questionnaire    Fear of Current or Ex-Partner: No    Emotionally Abused: No    Physically Abused: No    Sexually Abused: No     Current Outpatient Medications:    Albuterol  Sulfate (PROAIR  RESPICLICK) 108 (90 Base) MCG/ACT AEPB, Inhale into the lungs., Disp: , Rfl:    cyclobenzaprine  (FLEXERIL ) 10 MG tablet, Take 1 tablet (10 mg total) by mouth 3 (three) times daily as needed for muscle spasms., Disp: 30 tablet, Rfl: 0   Iron , Ferrous Sulfate , 325 (65 Fe) MG TABS, Take 325 mg by mouth daily., Disp: 90 tablet, Rfl:  1   ketorolac  (TORADOL ) 10 MG tablet, Take 1 tablet (10 mg total) by mouth every 6 (six) hours as needed., Disp: 20 tablet, Rfl: 2   Vitamin D , Ergocalciferol , (DRISDOL ) 1.25 MG (50000 UNIT) CAPS capsule, Take 1 capsule (50,000 Units total) by mouth every 7 (seven) days., Disp: 8 capsule, Rfl: 0   WEGOVY 0.25 MG/0.5ML SOAJ SQ injection, Inject 0.25 mg into the skin once a week. Use this dose for 1 month (4 shots) and then increase to next higher dose., Disp: 2 mL, Rfl: 0  No Known Allergies   ROS  Constitutional: Negative for fever or weight change.  Respiratory: Negative for cough and shortness of breath.   Cardiovascular: Negative for chest pain or palpitations.  Gastrointestinal: Negative for abdominal pain, no bowel changes.  Musculoskeletal: Negative for gait problem or joint swelling.  Skin: Negative for rash.  Neurological: Negative for dizziness or headache.  No other specific complaints in a complete review of systems (except as listed in HPI above).   Objective  Vitals:   02/07/24 0851  BP: 136/86  Pulse: 61  Temp: 98 F (36.7 C)  SpO2: 99%  Weight: 203 lb (92.1 kg)  Height: 5' 2 (1.575 m)    Body mass index is 37.13 kg/m.  Physical Exam Vitals reviewed.  Constitutional:      Appearance: Normal appearance.  HENT:     Head: Normocephalic.     Right Ear: Tympanic membrane normal.     Left Ear: Tympanic membrane normal.     Nose: Nose normal.  Eyes:     Extraocular Movements: Extraocular movements intact.     Conjunctiva/sclera: Conjunctivae normal.     Pupils: Pupils are equal, round, and reactive to light.  Neck:     Thyroid: No thyroid mass, thyromegaly or thyroid tenderness.  Cardiovascular:     Rate and Rhythm: Normal rate and regular rhythm.     Pulses: Normal pulses.     Heart sounds: Normal heart sounds.  Pulmonary:     Effort: Pulmonary effort is normal.     Breath sounds: Normal breath sounds.  Abdominal:     General: Bowel sounds are  normal.     Palpations: Abdomen is soft.  Musculoskeletal:        General: Normal range of motion.     Cervical back: Normal range of motion and neck supple.     Right lower leg: No edema.     Left lower leg: No edema.  Skin:    General: Skin is warm and dry.     Capillary Refill: Capillary refill takes less than 2 seconds.  Neurological:     General: No focal deficit present.     Mental Status: She is alert and oriented to person, place, and time. Mental status is at baseline.  Psychiatric:        Mood and Affect: Mood normal.        Behavior: Behavior normal.        Thought Content: Thought content normal.        Judgment: Judgment normal.     Fall Risk:    08/07/2023   10:22 AM 07/20/2021   10:35 AM 02/16/2021    9:43 AM 04/03/2020   11:15 AM 03/23/2020    1:38 PM  Fall Risk   Falls in the past year? 0 0 0 0 0  Number falls in past yr: 0 0 0    Injury with Fall? 0 0 0    Risk for fall due to :  No Fall Risks No Fall Risks    Follow up Falls evaluation completed Falls evaluation completed  Falls evaluation completed        Data saved with a previous flowsheet row definition     Functional Status Survey: Is the patient deaf or have difficulty hearing?: No Does the patient have difficulty seeing, even when wearing glasses/contacts?: No Does the patient have difficulty concentrating, remembering, or making decisions?: No Does the patient have difficulty walking or climbing stairs?: No Does the patient have difficulty dressing or bathing?: No Does the patient have difficulty doing errands alone such as visiting a doctor's office or shopping?: No   Assessment & Plan  Problem List Items Addressed This Visit       Cardiovascular and Mediastinum   Varicose veins of both lower extremities with pain   Relevant Orders   Ambulatory referral to Vascular Surgery   CBC with Differential/Platelet   Iron , TIBC and Ferritin Panel   Comprehensive metabolic panel with GFR      Other  Iron  deficiency anemia   Class 1 obesity due to excess calories with serious comorbidity and body mass index (BMI) of 34.0 to 34.9 in adult   Relevant Medications   WEGOVY  0.25 MG/0.5ML SOAJ SQ injection   Other Visit Diagnoses       Annual physical exam    -  Primary     Encounter for screening mammogram for malignant neoplasm of breast         Screening mammogram for breast cancer       Relevant Orders   MM 3D SCREENING MAMMOGRAM BILATERAL BREAST     Urinary incontinence, unspecified type       Relevant Orders   Ambulatory referral to Urology     Menorrhagia with regular cycle       Relevant Orders   Ambulatory referral to Gynecology   TSH   FSH/LH      Assessment and Plan Assessment & Plan Adult Wellness Visit Annual physical examination conducted. Discussed diet, exercise, and sleep habits. Depression screening positive with a score of 4 or higher. Blood pressure borderline high. Discussed colorectal cancer screening options. - Ordered lab work including blood counts, iron , and metabolic counts - Ordered mammogram - Scheduled follow-up in three months if Wegovy  is approved - Encouraged 150 minutes of physical activity per week and strength training twice a week - Advised on reducing sodium intake and increasing protein to prevent muscle loss and hair loss - Discussed colorectal cancer screening options  Varicose veins of bilateral lower extremities with pain Worsening varicose veins post-pregnancy with associated leg pain occurring 3-4 days a week. Previous sclerotherapy in 2017 was ineffective. Symptoms include tingling and crawling sensations. Differential diagnosis includes other vascular issues. - Referred to vascular specialist for further evaluation, including possible ultrasounds and ABIs - Continue ibuprofen  for pain management as needed  Iron  deficiency anemia Slight anemia with low iron  panel and low end normal B12. Currently taking iron  supplements.  Symptoms may be exacerbated by menstrual cycle. - Ordered repeat iron  panel when not menstruating  Obesity BMI of 37.13. Previous use of phentermine  was unsuccessful due to adverse effects. Discussed GLP-1 medications for weight loss, including Wegovy  and Mounjaro. Wegovy  has additional benefits for heart and kidney protection. Discussed potential side effects and insurance coverage process. - Submitted prior authorization for Wegovy  - Scheduled follow-up in three months if Wegovy  is approved - Encouraged physical activity and dietary modifications - Encourage continuation of lifestyle modifications, including dietary management and regular exercise. -continue to increase physical activity, getting at least 150 min of physical activity a week.  Work on including runner, broadcasting/film/video 2 days a week.  - continue eating at a calorie deficit 1600-1700 cal a day, eating a well balanced diet with whole foods, avoiding processed foods.   Patient is motivated to continue working on lifestyle modification.   Healthy Weight Loss Guide ?? Weight Loss Goal - Aim for 1-2 pounds per week - Target: 5-10% of your starting body weight over 3-6 months ??? Nutrition Tips - Eat 3 meals per day and avoid skipping meals - Fill half your plate with vegetables, a quarter with protein, a quarter with whole grains - Choose lean proteins: chicken, fish, eggs, tofu, beans - Limit: - Sugary drinks (soda, sweet tea, juice) - Fried foods and fast food - Processed snacks (chips, candy, cookies) - Drink at least 64 oz of water per day - Practice portion control and mindful eating ???? Lifestyle Habits - Track what you eat (apps like  MyFitnessPal, Lose It!, or a paper log) - Get 7-9 hours of sleep per night - Manage stress (meditation, breathing exercises, counseling if needed) - Limit alcohol (empty calories and may increase hunger) ???? Exercise Recommendations - Goal: 150 minutes per week of moderate activity (e.g., brisk  walking, cycling) - Start with 10-15 minutes/day and build up gradually - Add 2 days per week of strength training (light weights, resistance bands, or bodyweight) ?? Remember: Progress > Perfection Small changes every day add up. Don't give up! - Avoid high-fat or greasy foods to reduce nausea - Focus on protein at each meal to preserve muscle mass - Stay well hydrated (at least 64 oz water per day) - Limit sugar and processed carbohydrates ?? Managing Side Effects if on weight loss medication - Eat slowly and stop eating when you feel full - Use anti-nausea strategies: ginger tea, peppermint, crackers - Talk to your provider about adjusting the dose if needed - Stool softeners or fiber supplements can help with constipation ?? Staying on Track - Track weight and non-scale victories (energy, clothing fit, labs) - Follow up with your provider regularly - Don't stop medication without medical guidance - Combine medication with healthy habits for best results ?? Remember Weight loss medications are a tool, not a shortcut. Healthy habits matter. Be patient and consistent--small changes lead to big results.  Unspecified urinary incontinence Reports of urinary incontinence described as leaking. Discussed potential referral to urology and pelvic floor therapy. - Referred to urology for further evaluation - Discussed Kegel exercises and pelvic floor therapy  Excessive and frequent menstruation with regular cycle Heavy and painful periods with mid-cycle spotting. Possible causes include premenopausal changes or uterine polyps. Last GYN visit was three years ago. - Referred to GYN for further evaluation -fsh/lh and tsh ordered  Screening mammogram for malignant neoplasm of breast Mammogram screening ordered as part of routine health maintenance. - Ordered mammogram    -USPSTF grade A and B recommendations reviewed with patient; age-appropriate recommendations, preventive care, screening  tests, etc discussed and encouraged; healthy living encouraged; see AVS for patient education given to patient -Discussed importance of 150 minutes of physical activity weekly, eat two servings of fish weekly, eat one serving of tree nuts ( cashews, pistachios, pecans, almonds.SABRA) every other day, eat 6 servings of fruit/vegetables daily and drink plenty of water and avoid sweet beverages.   -Reviewed Health Maintenance: yes

## 2024-02-09 NOTE — Telephone Encounter (Signed)
 Requested Prescriptions  Refused Prescriptions Disp Refills   WEGOVY  0.25 MG/0.5ML SOAJ SQ injection [Pharmacy Med Name: WEGOVY  0.25 MG/0.5 ML PEN]  0    Sig: INJECT 0.25 MG INTO THE SKIN ONCE A WEEK. USE THIS DOSE FOR 1 MONTH (4 SHOTS) AND THEN INCREASE TO NEXT HIGHER DOSE.     Endocrinology:  Diabetes - GLP-1 Receptor Agonists - semaglutide  Failed - 02/09/2024  1:51 PM      Failed - HBA1C in normal range and within 180 days    Hgb A1c MFr Bld  Date Value Ref Range Status  08/07/2023 5.7 (H) <5.7 % Final    Comment:    For someone without known diabetes, a hemoglobin  A1c value between 5.7% and 6.4% is consistent with prediabetes and should be confirmed with a  follow-up test. . For someone with known diabetes, a value <7% indicates that their diabetes is well controlled. A1c targets should be individualized based on duration of diabetes, age, comorbid conditions, and other considerations. . This assay result is consistent with an increased risk of diabetes. . Currently, no consensus exists regarding use of hemoglobin A1c for diagnosis of diabetes for children. .          Passed - Cr in normal range and within 360 days    Creat  Date Value Ref Range Status  08/07/2023 0.74 0.50 - 0.97 mg/dL Final         Passed - Valid encounter within last 6 months    Recent Outpatient Visits           2 days ago Annual physical exam   [facility] Gareth Mliss FALCON, FNP   6 months ago Class 1 obesity due to excess calories with serious comorbidity and body mass index (BMI) of 34.0 to 34.9 in adult   [facility] Gareth Mliss FALCON, FNP       Future Appointments             In 2 months MacDiarmid, Glendia, MD [facility]

## 2024-03-27 ENCOUNTER — Encounter (HOSPITAL_COMMUNITY): Payer: Self-pay

## 2024-03-27 ENCOUNTER — Encounter: Payer: Self-pay | Admitting: Nurse Practitioner

## 2024-03-27 ENCOUNTER — Other Ambulatory Visit (HOSPITAL_COMMUNITY): Payer: Self-pay

## 2024-03-27 NOTE — Telephone Encounter (Signed)
 Good morning, we need Cindy Walker [birth year]26 prescription coverage information such as bin #, pcn, group #, and ID and we sent her a MyChart message as well.  Thanks!

## 2024-04-01 ENCOUNTER — Telehealth: Payer: Self-pay | Admitting: Pharmacy Technician

## 2024-04-01 ENCOUNTER — Other Ambulatory Visit (HOSPITAL_COMMUNITY): Payer: Self-pay

## 2024-04-01 NOTE — Telephone Encounter (Signed)
 Pharmacy Patient Advocate Encounter   Received notification from Patient Advice Request messages that prior authorization for Wegovy  0.25 mg/0.5ml injection is required/requested.   Insurance verification completed.   The patient is insured through [facility].   Per test claim: Per test claim, medication is not covered due to plan/benefit exclusion, PA not submitted at this time    WEIGHT LOSS DRUGS NOT COVERED

## 2024-04-01 NOTE — Telephone Encounter (Signed)
 Pharmacy Patient Advocate Encounter   Received notification from Patient Advice Request messages that prior authorization for Wegovy  0.25 mg/0.85ml is required/requested.   Insurance verification completed.   The patient is insured through [facility].   Per test claim: Per test claim, medication is not covered due to plan/benefit exclusion, PA not submitted at this time   WEIGHT LOSS DRUGS NOT COVERED

## 2024-04-03 ENCOUNTER — Other Ambulatory Visit (HOSPITAL_COMMUNITY): Payer: Self-pay

## 2024-04-11 ENCOUNTER — Other Ambulatory Visit (INDEPENDENT_AMBULATORY_CARE_PROVIDER_SITE_OTHER): Payer: Self-pay | Admitting: Vascular Surgery

## 2024-04-11 DIAGNOSIS — I83813 Varicose veins of bilateral lower extremities with pain: Secondary | ICD-10-CM

## 2024-04-15 ENCOUNTER — Encounter (INDEPENDENT_AMBULATORY_CARE_PROVIDER_SITE_OTHER): Admitting: Vascular Surgery

## 2024-04-15 ENCOUNTER — Encounter (INDEPENDENT_AMBULATORY_CARE_PROVIDER_SITE_OTHER)

## 2024-04-29 ENCOUNTER — Ambulatory Visit: Admitting: Urology

## 2024-04-29 ENCOUNTER — Encounter (INDEPENDENT_AMBULATORY_CARE_PROVIDER_SITE_OTHER)

## 2024-05-09 ENCOUNTER — Encounter (INDEPENDENT_AMBULATORY_CARE_PROVIDER_SITE_OTHER): Admitting: Vascular Surgery

## 2024-05-09 ENCOUNTER — Ambulatory Visit: Admitting: Nurse Practitioner
